# Patient Record
Sex: Female | Born: 1960 | Race: Black or African American | Hispanic: No | Marital: Married | Smoking: Former smoker
Health system: Southern US, Community
[De-identification: ages and names within clinical notes are randomized; demographics above are authoritative.]

## PROBLEM LIST (undated history)

## (undated) DIAGNOSIS — R7303 Prediabetes: Secondary | ICD-10-CM

## (undated) DIAGNOSIS — J45909 Unspecified asthma, uncomplicated: Secondary | ICD-10-CM

## (undated) DIAGNOSIS — I10 Essential (primary) hypertension: Secondary | ICD-10-CM

## (undated) DIAGNOSIS — N951 Menopausal and female climacteric states: Secondary | ICD-10-CM

## (undated) DIAGNOSIS — R1310 Dysphagia, unspecified: Secondary | ICD-10-CM

## (undated) DIAGNOSIS — K219 Gastro-esophageal reflux disease without esophagitis: Secondary | ICD-10-CM

## (undated) DIAGNOSIS — F32A Depression, unspecified: Secondary | ICD-10-CM

## (undated) DIAGNOSIS — F909 Attention-deficit hyperactivity disorder, unspecified type: Secondary | ICD-10-CM

## (undated) HISTORY — PX: NASAL SINUS SURGERY: SHX719

## (undated) HISTORY — DX: Attention-deficit hyperactivity disorder, unspecified type: F90.9

## (undated) HISTORY — DX: Gastro-esophageal reflux disease without esophagitis: K21.9

## (undated) HISTORY — DX: Menopausal and female climacteric states: N95.1

## (undated) HISTORY — PX: SHOULDER SURGERY: SHX246

## (undated) HISTORY — DX: Depression, unspecified: F32.A

---

## 2000-11-11 ENCOUNTER — Encounter: Payer: Self-pay | Admitting: Family Medicine

## 2000-11-11 ENCOUNTER — Ambulatory Visit (HOSPITAL_COMMUNITY): Admission: RE | Admit: 2000-11-11 | Discharge: 2000-11-11 | Payer: Self-pay | Admitting: Family Medicine

## 2000-12-29 ENCOUNTER — Encounter: Payer: Self-pay | Admitting: *Deleted

## 2000-12-29 ENCOUNTER — Ambulatory Visit (HOSPITAL_COMMUNITY): Admission: RE | Admit: 2000-12-29 | Discharge: 2000-12-29 | Payer: Self-pay | Admitting: *Deleted

## 2001-06-26 ENCOUNTER — Encounter: Payer: Self-pay | Admitting: Family Medicine

## 2001-06-26 ENCOUNTER — Ambulatory Visit (HOSPITAL_COMMUNITY): Admission: RE | Admit: 2001-06-26 | Discharge: 2001-06-26 | Payer: Self-pay | Admitting: Family Medicine

## 2001-07-10 ENCOUNTER — Encounter: Payer: Self-pay | Admitting: Family Medicine

## 2001-07-10 ENCOUNTER — Ambulatory Visit (HOSPITAL_COMMUNITY): Admission: RE | Admit: 2001-07-10 | Discharge: 2001-07-10 | Payer: Self-pay | Admitting: Family Medicine

## 2002-03-02 ENCOUNTER — Encounter: Payer: Self-pay | Admitting: *Deleted

## 2002-03-02 ENCOUNTER — Ambulatory Visit (HOSPITAL_COMMUNITY): Admission: RE | Admit: 2002-03-02 | Discharge: 2002-03-02 | Payer: Self-pay | Admitting: *Deleted

## 2002-03-23 ENCOUNTER — Ambulatory Visit (HOSPITAL_COMMUNITY): Admission: RE | Admit: 2002-03-23 | Discharge: 2002-03-23 | Payer: Self-pay | Admitting: *Deleted

## 2002-03-23 ENCOUNTER — Encounter: Payer: Self-pay | Admitting: *Deleted

## 2002-04-13 ENCOUNTER — Ambulatory Visit (HOSPITAL_COMMUNITY): Admission: RE | Admit: 2002-04-13 | Discharge: 2002-04-13 | Payer: Self-pay | Admitting: Internal Medicine

## 2002-07-13 ENCOUNTER — Encounter: Payer: Self-pay | Admitting: Family Medicine

## 2002-07-13 ENCOUNTER — Ambulatory Visit (HOSPITAL_COMMUNITY): Admission: RE | Admit: 2002-07-13 | Discharge: 2002-07-13 | Payer: Self-pay | Admitting: *Deleted

## 2003-08-02 ENCOUNTER — Ambulatory Visit (HOSPITAL_COMMUNITY): Admission: RE | Admit: 2003-08-02 | Discharge: 2003-08-02 | Payer: Self-pay | Admitting: Family Medicine

## 2004-01-01 ENCOUNTER — Ambulatory Visit (HOSPITAL_COMMUNITY): Admission: RE | Admit: 2004-01-01 | Discharge: 2004-01-01 | Payer: Self-pay | Admitting: Occupational Therapy

## 2004-07-01 ENCOUNTER — Ambulatory Visit (HOSPITAL_COMMUNITY): Admission: RE | Admit: 2004-07-01 | Discharge: 2004-07-01 | Payer: Self-pay | Admitting: Occupational Therapy

## 2004-07-02 ENCOUNTER — Inpatient Hospital Stay: Payer: Self-pay

## 2005-04-05 ENCOUNTER — Ambulatory Visit (HOSPITAL_COMMUNITY): Admission: RE | Admit: 2005-04-05 | Discharge: 2005-04-05 | Payer: Self-pay | Admitting: Obstetrics and Gynecology

## 2005-05-04 ENCOUNTER — Ambulatory Visit (HOSPITAL_COMMUNITY): Admission: RE | Admit: 2005-05-04 | Discharge: 2005-05-04 | Payer: Self-pay | Admitting: Internal Medicine

## 2005-05-06 ENCOUNTER — Ambulatory Visit (HOSPITAL_COMMUNITY): Admission: RE | Admit: 2005-05-06 | Discharge: 2005-05-06 | Payer: Self-pay | Admitting: Internal Medicine

## 2005-08-03 ENCOUNTER — Ambulatory Visit (HOSPITAL_COMMUNITY): Admission: RE | Admit: 2005-08-03 | Discharge: 2005-08-03 | Payer: Self-pay | Admitting: Internal Medicine

## 2005-08-19 ENCOUNTER — Encounter: Admission: RE | Admit: 2005-08-19 | Discharge: 2005-08-19 | Payer: Self-pay | Admitting: Internal Medicine

## 2005-11-04 ENCOUNTER — Ambulatory Visit: Payer: Self-pay | Admitting: Internal Medicine

## 2005-11-26 ENCOUNTER — Ambulatory Visit: Payer: Self-pay | Admitting: Internal Medicine

## 2005-11-26 ENCOUNTER — Ambulatory Visit (HOSPITAL_COMMUNITY): Admission: RE | Admit: 2005-11-26 | Discharge: 2005-11-26 | Payer: Self-pay | Admitting: Internal Medicine

## 2006-02-10 ENCOUNTER — Ambulatory Visit (HOSPITAL_COMMUNITY): Admission: RE | Admit: 2006-02-10 | Discharge: 2006-02-10 | Payer: Self-pay | Admitting: Nurse Practitioner

## 2006-04-12 ENCOUNTER — Ambulatory Visit: Payer: Self-pay | Admitting: Internal Medicine

## 2006-04-26 ENCOUNTER — Ambulatory Visit (HOSPITAL_COMMUNITY): Admission: RE | Admit: 2006-04-26 | Discharge: 2006-04-26 | Payer: Self-pay | Admitting: Internal Medicine

## 2006-04-26 ENCOUNTER — Ambulatory Visit: Payer: Self-pay | Admitting: Internal Medicine

## 2006-08-10 ENCOUNTER — Ambulatory Visit (HOSPITAL_COMMUNITY): Admission: RE | Admit: 2006-08-10 | Discharge: 2006-08-10 | Payer: Self-pay | Admitting: Nurse Practitioner

## 2006-09-06 ENCOUNTER — Ambulatory Visit: Payer: Self-pay | Admitting: Internal Medicine

## 2007-04-06 ENCOUNTER — Ambulatory Visit (HOSPITAL_COMMUNITY): Admission: RE | Admit: 2007-04-06 | Discharge: 2007-04-06 | Payer: Self-pay | Admitting: Obstetrics and Gynecology

## 2007-08-14 ENCOUNTER — Ambulatory Visit (HOSPITAL_COMMUNITY): Admission: RE | Admit: 2007-08-14 | Discharge: 2007-08-14 | Payer: Self-pay | Admitting: Internal Medicine

## 2008-03-20 ENCOUNTER — Ambulatory Visit (HOSPITAL_COMMUNITY): Admission: RE | Admit: 2008-03-20 | Discharge: 2008-03-20 | Payer: Self-pay | Admitting: Internal Medicine

## 2008-03-20 ENCOUNTER — Ambulatory Visit: Payer: Self-pay | Admitting: Cardiology

## 2008-03-20 ENCOUNTER — Encounter (INDEPENDENT_AMBULATORY_CARE_PROVIDER_SITE_OTHER): Payer: Self-pay | Admitting: Internal Medicine

## 2008-08-14 ENCOUNTER — Ambulatory Visit (HOSPITAL_COMMUNITY): Admission: RE | Admit: 2008-08-14 | Discharge: 2008-08-14 | Payer: Self-pay | Admitting: Internal Medicine

## 2008-11-01 ENCOUNTER — Ambulatory Visit (HOSPITAL_COMMUNITY): Admission: RE | Admit: 2008-11-01 | Discharge: 2008-11-01 | Payer: Self-pay | Admitting: Internal Medicine

## 2009-02-25 DIAGNOSIS — J3489 Other specified disorders of nose and nasal sinuses: Secondary | ICD-10-CM | POA: Insufficient documentation

## 2009-05-30 ENCOUNTER — Ambulatory Visit: Payer: Self-pay | Admitting: Nurse Practitioner

## 2009-08-21 ENCOUNTER — Ambulatory Visit (HOSPITAL_COMMUNITY): Admission: RE | Admit: 2009-08-21 | Discharge: 2009-08-21 | Payer: Self-pay | Admitting: Internal Medicine

## 2009-08-21 ENCOUNTER — Ambulatory Visit (HOSPITAL_COMMUNITY): Admission: RE | Admit: 2009-08-21 | Discharge: 2009-08-21 | Payer: Self-pay | Admitting: Obstetrics and Gynecology

## 2009-11-17 ENCOUNTER — Ambulatory Visit: Payer: Self-pay | Admitting: Gastroenterology

## 2009-11-20 ENCOUNTER — Ambulatory Visit: Payer: Self-pay | Admitting: Gastroenterology

## 2010-07-12 ENCOUNTER — Encounter: Payer: Self-pay | Admitting: Internal Medicine

## 2010-07-12 ENCOUNTER — Encounter: Payer: Self-pay | Admitting: Obstetrics and Gynecology

## 2010-07-28 ENCOUNTER — Other Ambulatory Visit (HOSPITAL_COMMUNITY): Payer: Self-pay | Admitting: Nurse Practitioner

## 2010-07-28 DIAGNOSIS — Z139 Encounter for screening, unspecified: Secondary | ICD-10-CM

## 2010-08-24 ENCOUNTER — Ambulatory Visit (HOSPITAL_COMMUNITY)
Admission: RE | Admit: 2010-08-24 | Discharge: 2010-08-24 | Disposition: A | Discharge status: Home / Self Care | Payer: BC Managed Care – PPO | Source: Ambulatory Visit | Attending: Nurse Practitioner | Admitting: Nurse Practitioner

## 2010-08-24 DIAGNOSIS — Z139 Encounter for screening, unspecified: Secondary | ICD-10-CM

## 2010-08-24 DIAGNOSIS — Z1231 Encounter for screening mammogram for malignant neoplasm of breast: Secondary | ICD-10-CM | POA: Insufficient documentation

## 2010-11-06 NOTE — Op Note (Signed)
NAME:  Laurie Reyes, Laurie Reyes      ACCOUNT NO.:  [id number]   MEDICAL RECORD NO.:  [id number]          PATIENT TYPE:  AMB   LOCATION:  DAY                           FACILITY:  [facility]   PHYSICIAN:  R. Roetta Sessions, M.D. DATE OF BIRTH:  1960/12/06   DATE OF PROCEDURE:  04/26/2006  DATE OF DISCHARGE:                                 OPERATIVE REPORT   PROCEDURE:  1. Diagnostic colonoscopy.  2. Ileoscopy.   INDICATIONS FOR PROCEDURE:  The patient is a 50 year old lady with a change  in bowel habits with constipation recently.  She has not had any rectal  bleeding.  She does have a history of iron deficiency anemia.  Colonoscopy  now being done.  This approach has been discussed with the patient at  length.  The potential risks, benefits and alternatives have been reviewed.  She has never had her lower GI tract evaluated.  There is no family history  of colorectal neoplasia.   PROCEDURE NOTE:  The O2 saturation, blood pressure, pulse and respirations  were monitored throughout the entire procedure.   CONSCIOUS SEDATION:  1. Versed 4 mg IV.  2. Demerol 100 mg IV in divided doses.   INSTRUMENT:  Olympus video chip system.   FINDINGS:  Digital rectal exam revealed no abnormalities.   ENDOSCOPIC FINDINGS:  Prep was good.   Rectal:  Examination of the rectal mucosa including retroflexed view of the  anal verge revealed __________ anal papilla, otherwise the rectal mucosa  appeared normal.   Colon:  The colonic mucosa was surveyed from the rectosigmoid junction  through the left transverse, right colon to the appendiceal orifice,  ileocecal valve and cecum.  These structures were well seen, photographed  for the record, terminal ileum was then admitted to 10 cm.  From this level  the scope was withdrawn.  All previously reached mucosal surfaces were again  seen.  The terminal ileum mucosa appeared entirely normal.  The preparation  was excellent.  The patient tolerated the procedure  well and was reactive to  endoscopy.   IMPRESSION:  __________ anal papilla, otherwise normal rectum, colon and  terminal ileum.   RECOMMENDATIONS:  Constipation literature provided to Laurie Reyes.  Amitiza 24 mcg one tablet daily with food for 5 days and increased to 24 mcg  b.i.d. with food.  I plan to see this nice lady back in the office in 1  month.  Incidentally, she previously underwent dilation of her esophagus,  had EGD on November 26, 2005.  She tells me her dysphagia is completely resolved.      Jonathon Bellows, M.D.  Electronically Signed     RMR/MEDQ  D:  04/26/2006  T:  04/26/2006  Job:  [id number]   cc:   Tesfaye D. Felecia Shelling, MD  Fax: [phone]

## 2010-11-06 NOTE — H&P (Signed)
NAME:  Laurie, Reyes      ACCOUNT NO.:  [id number]   MEDICAL RECORD NO.:  [id number]          PATIENT TYPE:  OUT   LOCATION:  RAD                           FACILITY:  [facility]   PHYSICIAN:  R. Roetta Sessions, M.D. DATE OF BIRTH:  01/12/1961   DATE OF ADMISSION:  DATE OF DISCHARGE:  LH                                HISTORY & PHYSICAL   CHIEF COMPLAINT:  Gastroesophageal reflux disease, dysphagia and altered  bowel habits.   Laurie Reyes is a 50 year old African-American female with  longstanding gastroesophageal reflux __________ Schatzki's ring who reports  somewhat refractory reflux symptoms on Prilosec. Nexium never worked for  her. She is currently not on any acid suppression therapy. She also has  recurrent esophageal dysphagia to solids, history of a Schatzki's ring,  lastly dilated by me in 2003. It appeared to be noncritical. I passed a 56  [place] Maloney dilator. This lasted for a good year or so and then she  started having recurrent symptoms. Before this, I dilated her in 2000. She  also has alternating constipation and intermittent lower abdominal cramps  and has not passed any blood per rectum. There is no family history of  colorectal neoplasia. She has never had her low GI tract imaged; however,  she specifically has requested a colonoscopy because of her race and age.  She does not use nonsteroidals, she does not use tobacco or alcohol.   PAST MEDICAL HISTORY:  Significant for gastroesophageal reflux disease,  dysphagia.   PAST SURGICAL HISTORY:  Sinus surgery previously. She had a cardiac workup  in [place] earlier this year for some vague chest pain, cardiac workup was  negative.   CURRENT MEDICATIONS:  1.  Calcium once daily.  2.  Magnesium once daily.  3.  Multivitamin daily.  4.  Fish oil daily.  5.  Caltrate daily.  6.  Vitamin E 400 units daily.  7.  It is also notable that Dr. Felecia Shelling, her primary care physician, has also  started her on a pill which she does not know the name of, three tabs      daily which has helped with some of her abdominal symptoms.   ALLERGIES:  PENICILLIN and SULFA.   FAMILY HISTORY:  Mother is alive at age 36 with hypertension and  hypercholesterolemia. Father died at age 48 cause unknown. No history of  chronic GI or liver illness.   SOCIAL HISTORY:  The patient is married and has 2 children. 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 No tobacco, no alcohol.   REVIEW OF SYSTEMS:  No recent chest pain or dyspnea on exertion. No fever,  chills or change in weight.   PHYSICAL EXAMINATION:  GENERAL:  Reveals a pleasant, 50 year old lady  resting comfortably.  VITAL SIGNS:  Weight 187, height 5 foot 5, temp 98.3, BP 124/62, pulse 82.  SKIN:  Warm and dry.  HEENT:  No scleral icterus. JVD is not prominent.  CHEST:  Lungs are clear to auscultation.  CARDIAC:  Regular rate and rhythm without murmur, gallop or rub.  ABDOMEN:  Nondistended, positive  bowel sounds, soft, nontender without  appreciable mass or organomegaly.  EXTREMITIES:  No edema.  RECTAL:  Deferred to time of colonoscopy.   Not mentioned above, Laurie Reyes also mentions intermittent fecal  seepage. She might have a bowel movement later, urinate and detect a small  amount of fecal soilage in her underclothes.   IMPRESSION:  Laurie Reyes is a pleasant, 50 year old lady with  longstanding gastroesophageal reflux disease symptoms now with apparent  esophageal dysphagia. She has a known Schatzki's ring and benefitted from  dilation previously. She has alternating constipation, diarrhea and  intermittent fecal seepage. The symptoms are nonspecific but more or less  follow in the realm of irritable bowel syndrome. She has asked specifically  for a colonoscopy at this time. Although she is somewhat young for the  standard threshhold age for  screening, she is African-American and does have  altered bowel habits and this request is not unreasonable.   RECOMMENDATIONS:  Will go ahead and plan to perform an EGD with dilation as  appropriate as well as a diagnostic colonoscopy in the very near future. The  potential risks, benefits, and alternatives have been reviewed, questions  answered and she is agreeable. She is asked to bring the medication that Dr.  Felecia Shelling prescribed for her so we can determine the identity of that  medication.   I have also given her samples of AcipHex 20 mg tablet 1 daily #14 to try  between now and the time of her endoscopic evaluation to see how this works  for her reflux disease.      Jonathon Bellows, M.D.  Electronically Signed     RMR/MEDQ  D:  11/04/2005  T:  11/04/2005  Job:  [id number]   cc:   Tesfaye D. Felecia Shelling, MD  Fax: [phone]

## 2010-11-06 NOTE — H&P (Signed)
NAME:  KIOR, FLORIO      ACCOUNT NO.:  [id number]   MEDICAL RECORD NO.:  [id number]          PATIENT TYPE:  AMB   LOCATION:  DAY                           FACILITY:  [facility]   PHYSICIAN:  R. Roetta Sessions, M.D. DATE OF BIRTH:  [birth year]   DATE OF ADMISSION:  DATE OF DISCHARGE:  LH                                HISTORY & PHYSICAL   CHIEF COMPLAINT:  Change in bowel movements, constipation, weight loss,  abdominal pain and history of anemia.   HISTORY OF PRESENT ILLNESS:  Ms. Neumaier is a 50 year old African-  American female.  She was last seen by Dr. Jena Gauss when she underwent EGD on  November 26, 2005.  She was found to have normal EGD, a small hiatal hernia, and  was empirically dilated with a 56 [place] Maloney dilator.  She complains of  significant abdominal bloating.  She only had about two bowel movements in  the last two weeks.  She has tried fiber, over-the-counter laxatives and  Milk of Magnesia all of which she says locks her up.  Rarely she will have  intermittent diarrhea.  She tells me she has lost 10 pounds in the last 5  months.  This has been unintentional.  She denies any rectal bleeding or  melena.  She is only eating one meal a day because she has such severe  bloating and gas.  She also complains of a constant mid abdominal aching  pain.  She has been taking Aciphex 20 mg daily for her GERD symptoms which  are well-controlled at this time.  She has a history of anemia. She used to  be on iron and B-12.  She is having chronic nausea as well as dyspepsia.   PAST MEDICAL HISTORY:  1. GERD.  2. Dysphagia.  Last EGD as described in HPI.  3. Sinus surgery.  4. Cardiac work-up which was negative earlier this year.   CURRENT MEDICATIONS:  Denies any.   ALLERGIES:  PENICILLIN AND SULFA.   FAMILY HISTORY:  Mother is alive at age 35, hypertension and hyperlipidemia.  Father deceased at age 26, unknown etiology.  No family history of  colorectal carcinoma,  liver or chronic GI problems.   SOCIAL HISTORY:  She is married with two children.  She works at [employer] in  [place].  Denies any tobacco, alcohol or drug use.   REVIEW OF SYSTEMS:  CONSTITUTIONAL:  See HPI.  Denies any fever or chills.  Is complaining of some fatigue.  CARDIOVASCULAR:  Denies chest pain or  palpitations.  PULMONARY:  No shortness of breath, dyspnea, cough,  hemoptysis.  GI:  See HPI.  GU:  Denies any dysuria, hematuria, increased  urinary frequency.   PHYSICAL EXAMINATION:  VITAL SIGNS:  Weight 178.5 pounds, height 65 inches,  temperature 98.2, blood pressure 116/70, pulse 72.  GENERAL:  She is alert and oriented, pleasant and cooperative in no acute  distress.  HEENT: Sclerae clear, anicteric.  Conjunctivae pink. Oropharynx pink and  moist without lesions.  NECK:  Supple without mass or thyromegaly.  CHEST/HEART:  Regular rate and rhythm.  Normal S1 and S2. There are no  murmurs,clicks,rubs  or gallops.  LUNGS:  Clear to auscultation bilaterally.  ABDOMEN:  Positive bowel sounds x4.  No bruits auscultated.  Abdomen is  soft, nontender, nondistended without palpable mass or hepatosplenomegaly.  No rebound tenderness or guarding.  EXTREMITIES:  Without clubbing or edema bilaterally.   IMPRESSION:  Ms. Hemsley is a 50 year old African-American female who  has had a 10 pound unintentional weight loss in the last 5 months. She has a  history of anemia and has been on iron previously as well as B-12 throughout  her life.  She is currently not on B-12 injections.  She has constant mid  abdominal pain as well as constipation which has been a recent change in her  bowel habits.  It has become more severe.  She has had only 2 bowel  movements in the last two weeks.  She is requesting colonoscopy today and I  feel given her history of iron deficiency anemia, weight loss and abdominal  pain and constipation and recent change in bowel habits she warrants further   evaluation.   PLAN:  1. CBC, sedimentation rate and LFT's.  2. Colonoscopy with Dr. Jena Gauss in the near future.  I discussed the      procedure including the risks and benefits including, but not limited      to bleeding, infection, perforation, drug reaction.  A signed consent      will be obtained.  I have given her samples of Fibersure today as well      as constipation literature.  3. Aciphex 20 mg daily, #30 with 5 refills.      Nicholas Lose, N.P.      Jonathon Bellows, M.D.  Electronically Signed    KC/MEDQ  D:  04/13/2006  T:  04/14/2006  Job:  [id number]   cc:   Tesfaye D. Felecia Shelling, MD  Fax: [phone]

## 2010-11-06 NOTE — Procedures (Signed)
NAME:  Laurie Reyes, Laurie Reyes      ACCOUNT NO.:  [id number]   MEDICAL RECORD NO.:  [id number]          PATIENT TYPE:  OUT   LOCATION:  RAD                           FACILITY:  [facility]   PHYSICIAN:  Edward L. Juanetta Gosling, M.D.DATE OF BIRTH:  02/05/1961   DATE OF PROCEDURE:  DATE OF DISCHARGE:  03/20/2008                            PULMONARY FUNCTION TEST   1. Spirometry shows no ventilatory defect and no airflow obstruction.  2. Lung volumes are normal.  3. DLCO is normal.  4. Arterial blood gases normal.      Edward L. Juanetta Gosling, M.D.  Electronically Signed     ELH/MEDQ  D:  03/23/2008  T:  03/23/2008  Job:  [id number]   cc:   Ninetta Lights D. Felecia Shelling, MD  Fax: [phone]

## 2010-11-06 NOTE — Op Note (Signed)
NAME:  Laurie Reyes, Laurie Reyes                  ACCOUNT NO.:  [id number]   MEDICAL RECORD NO.:  [id number]                   PATIENT TYPE:  AMB   LOCATION:  DAY                                  FACILITY:  [facility]   PHYSICIAN:  R. Roetta Sessions, M.D.              DATE OF BIRTH:  1960/07/02   DATE OF PROCEDURE:  DATE OF DISCHARGE:                                 OPERATIVE REPORT   PROCEDURE:  Esophagogastroduodenoscopy with Maloney dilation.   ENDOSCOPIST:  Gerrit Friends. Rourk, M.D.   INDICATIONS FOR PROCEDURE:  The patient is a 50 year old lady with recurrent  esophageal dysphagia.  She has a history of known Schatzki ring which was  dilated some 3 years ago and did well until recently.  Also intermittent  postprandial abdominal cramps and diarrhea, for which NuLev a.c. and h.s.  has been a great help.  CBC recently demonstrated a mild anemia.  Hemoglobin  and hematocrit 11.7 and 35.8, MCV 81.5.  Sed rate, LFTs and amylase all  normal.  EGD is now being done to further evaluate and intervene known  esophageal dysphagia as appropriate.  This approach has been discussed with  the patient at length previously.  The potential risks, benefits, and  alternatives have been reviewed; and questions answered.  Please see my  dictated H&P for more information.  ASA 1.   DESCRIPTION OF PROCEDURE:  O2 saturation, blood pressure, pulse and  respirations were monitored throughout the entirety of the procedure.   CONSCIOUS SEDATION:  Versed 4 mg IV, Demerol 75 mg IV in divided doses.   INSTRUMENT:  Olympus video chip gastroscope.   FINDINGS:  Examination of the tubular esophagus revealed indistinct Schatzki  ring.  Esophageal mucosa otherwise appeared normal.  The EG junction was  easily traversed.   STOMACH:  The gastric cavity was empty.  It insufflated well with air.  A  thorough examination of the gastric mucosa including a retroflex view of the  proximal stomach and esophagogastric junction  demonstrated only a small  hiatal hernia.  The pylorus was patent and easily traversed.   DUODENUM:  The bulb and the second portion appeared normal.   THERAPY/DIAGNOSTIC MANEUVERS:  A 56 French Maloney dilator was passed to  full insertion with ease.  A look back revealed a slight trauma through the  UVAS otherwise no apparent complications related to passage of the dilator.   The patient tolerated the procedure well and was reacted in endoscopy.   IMPRESSION:  1. Indistinct Schatzki ring otherwise normal esophageal mucosa.  2. Status post Maloney dilation as described above.  3. A small hiatal hernia.  4. The remainder of the gastric mucosa appeared normal.  5. Normal D1, D2.   RECOMMENDATIONS:  1. Continue Prilosec 20 mg orally daily for GERD.  2. Continue NuLev a.c. and h.s. p.r.n.  3. Follow up appointment with me in 6 weeks to see how she is doing.  Jonathon Bellows, M.D.    RMR/MEDQ  D:  04/13/2002  T:  04/13/2002  Job:  [id number]   cc:   Jeri Modena

## 2010-11-06 NOTE — Op Note (Signed)
NAME:  Laurie Reyes, Laurie Reyes      ACCOUNT NO.:  [id number]   MEDICAL RECORD NO.:  [id number]          PATIENT TYPE:  AMB   LOCATION:  DAY                           FACILITY:  [facility]   PHYSICIAN:  R. Roetta Sessions, M.D. DATE OF BIRTH:  [birth year]   DATE OF PROCEDURE:  11/26/2005  DATE OF DISCHARGE:                                 OPERATIVE REPORT   PROCEDURE:  Esophagogastroduodenoscopy with Elease Hashimoto dilation.   INDICATION FOR PROCEDURE:  The patient is a 50 year old lady who has a  history of esophageal dysphagia, for which she underwent passage of a 53  French Malone dilator back in 2003.  She had an excellent resolution in her  symptoms until just recently.  She has had alternating constipation and  diarrhea with intermittent fecal seepage as well.  I started her on some  AcipHex 20 mg orally daily recently.  She tells me that ALL the above-  mentioned symptoms have resolved.  She has now declined to have a  colonoscopy, citing her insurance benefit issues as the reason.  EGD is now  being done.  Potential risks, benefits, and alternatives have been reviewed,  questions answered, she is agreeable.  Please see documentation in the  medical record.   PROCEDURE NOTE:  O2 saturation, blood pressure, pulse, and respiration were  monitored throughout the entire procedure.   CONSCIOUS SEDATION:  Versed 5 mg IV, Demerol 75 mg IV in divided doses.   INSTRUMENT USED:  Olympus video chip system.   FINDINGS:  Examination of the tubular esophagus revealed no mucosal  abnormalities.  EG junction easily traversed.   Stomach:  The gastric cavity was empty and insufflated well with air.  A  thorough examination of the gastric mucosa including a retroflexed view of  the proximal stomach and esophagogastric junction demonstrated only a small  hiatal hernia.  The pylorus was patent and easily traversed.  Examination of  the bulb and second portion revealed no abnormalities.   Therapeutic/diagnostic maneuvers performed:  A 56 French Maloney dilator was  passed to full insertion with good patient tolerance.  A look back revealed  there was a small rent through the UES mucosa, otherwise no apparent  complications related to passage of the dilator.  The patient tolerated the  procedure well and was reacted in endoscopy.   IMPRESSION:  1.  Endoscopically normal-appearing esophagus, status post dilation      described above.  2.  Small hiatal hernia, otherwise normal stomach, D1, D2.   RECOMMENDATIONS:  1.  She has had a remarkably good response to AcipHex.  Will continue on      AcipHex 20 mg orally daily.  2.  She has declined to have a colonoscopy at this time.  I will at least      send her home with three mail-in Hemoccults.  We will plan to see her      back in 3 months and see how she is doing.      Jonathon Bellows, M.D.  Electronically Signed     RMR/MEDQ  D:  11/26/2005  T:  11/26/2005  Job:  [id number]   cc:  Tesfaye D. Felecia Shelling, MD  Fax: [phone]

## 2011-03-22 LAB — BLOOD GAS, ARTERIAL
Acid-base deficit: 0.7
FIO2: 21
pCO2 arterial: 38.7
pH, Arterial: 7.4

## 2011-08-11 ENCOUNTER — Other Ambulatory Visit: Payer: Self-pay | Admitting: Gastroenterology

## 2011-08-11 LAB — CLOSTRIDIUM DIFFICILE BY PCR

## 2011-08-12 LAB — WBCS, STOOL

## 2011-08-14 LAB — STOOL CULTURE

## 2011-08-17 ENCOUNTER — Other Ambulatory Visit (HOSPITAL_COMMUNITY): Payer: Self-pay | Admitting: Nurse Practitioner

## 2011-08-17 DIAGNOSIS — Z139 Encounter for screening, unspecified: Secondary | ICD-10-CM

## 2011-08-27 ENCOUNTER — Ambulatory Visit (HOSPITAL_COMMUNITY): Payer: BC Managed Care – PPO

## 2011-08-27 ENCOUNTER — Ambulatory Visit (HOSPITAL_COMMUNITY)
Admission: RE | Admit: 2011-08-27 | Discharge: 2011-08-27 | Disposition: A | Discharge status: Home / Self Care | Payer: BC Managed Care – PPO | Source: Ambulatory Visit | Attending: Nurse Practitioner | Admitting: Nurse Practitioner

## 2011-08-27 DIAGNOSIS — Z139 Encounter for screening, unspecified: Secondary | ICD-10-CM

## 2011-08-27 DIAGNOSIS — Z1231 Encounter for screening mammogram for malignant neoplasm of breast: Secondary | ICD-10-CM | POA: Insufficient documentation

## 2011-09-28 ENCOUNTER — Ambulatory Visit: Payer: Self-pay | Admitting: Gastroenterology

## 2011-10-01 LAB — PATHOLOGY REPORT

## 2012-09-01 ENCOUNTER — Other Ambulatory Visit (HOSPITAL_COMMUNITY): Payer: Self-pay | Admitting: Internal Medicine

## 2012-09-01 DIAGNOSIS — Z139 Encounter for screening, unspecified: Secondary | ICD-10-CM

## 2012-09-05 ENCOUNTER — Ambulatory Visit (HOSPITAL_COMMUNITY): Payer: BC Managed Care – PPO

## 2012-09-05 ENCOUNTER — Ambulatory Visit (HOSPITAL_COMMUNITY)
Admission: RE | Admit: 2012-09-05 | Discharge: 2012-09-05 | Disposition: A | Discharge status: Home / Self Care | Payer: BC Managed Care – PPO | Source: Ambulatory Visit | Attending: Internal Medicine | Admitting: Internal Medicine

## 2012-09-05 DIAGNOSIS — Z139 Encounter for screening, unspecified: Secondary | ICD-10-CM

## 2012-09-05 DIAGNOSIS — Z1231 Encounter for screening mammogram for malignant neoplasm of breast: Secondary | ICD-10-CM | POA: Insufficient documentation

## 2013-04-06 ENCOUNTER — Encounter (HOSPITAL_COMMUNITY): Payer: Self-pay | Admitting: Psychiatry

## 2013-04-06 ENCOUNTER — Ambulatory Visit (INDEPENDENT_AMBULATORY_CARE_PROVIDER_SITE_OTHER): Payer: BC Managed Care – PPO | Admitting: Psychiatry

## 2013-04-06 VITALS — BP 122/78 | Ht 65.0 in | Wt 203.0 lb

## 2013-04-06 DIAGNOSIS — F988 Other specified behavioral and emotional disorders with onset usually occurring in childhood and adolescence: Secondary | ICD-10-CM

## 2013-04-06 MED ORDER — LISDEXAMFETAMINE DIMESYLATE 30 MG PO CAPS: 30.0000 mg | ORAL_CAPSULE | ORAL | Status: DC

## 2013-04-06 NOTE — Progress Notes (Signed)
Psychiatric Assessment Adult  Patient Identification:  Laurie Reyes Date of Evaluation:  04/06/2013 Chief Complaint: "I can't stay focused or complete tasks." History of Chief Complaint:   Chief Complaint  Patient presents with  . ADD  . 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   The patient is self-referred. I treated her great niece for ADHD and we have met through that connection.  The patient states that she's not significantly depressed or anxious at the present time. She was anxious for a while and her primary Dr. put her on citalopram which has been helpful. She is going through menopause and has difficulty sleeping. She often has hot flashes and night sweats. Her gynecologist put her on progesterone but she has stopped it because she is concerned about side effects. The trazodone helps to some degree.  Recently person provides are noted that she's unable to complete her work shift. She's scattered and can't complete tasks. She'll start one thing and go to another. She's very unfocused at home and work. She denies been critically worried about anything. Her 39 year old son as a criminal history but she feels like she's done everything she can do to help him. Her marriage is going well. Her niece does have ADHD and has difficulties in school but we are trying to manage this with her here. She denies anything like suicidal ideation or psychotic symptoms but simply can't stay on task. Review of Systems  Constitutional: Positive for diaphoresis.   Physical Exam not done  Depressive Symptoms: insomnia, disturbed sleep,  (Hypo) Manic Symptoms:   Elevated Mood:  No Irritable Mood:  No Grandiosity:  No Distractibility:   Yes Labiality of Mood:  No Delusions:  No Hallucinations:  No Impulsivity:  No Sexually Inappropriate Behavior:  No Financial Extravagance:  No Flight of Ideas:  No  Anxiety Symptoms: Excessive Worry:  No Panic Symptoms:  No Agoraphobia:  No Obsessive Compulsive: No  Symptoms: None, Specific Phobias:  No Social Anxiety:  No  Psychotic Symptoms:  Hallucinations: No None Delusions:  No Paranoia:  No   Ideas of Reference:  No  PTSD Symptoms: Ever had a traumatic exposure:  No Had a traumatic exposure in the last month:  No Re-experiencing: No None Hypervigilance:  No Hyperarousal: No None Avoidance: No None  Traumatic Brain Injury: No  Past Psychiatric History: Diagnosis: Mild anxiety   Hospitalizations: None   Outpatient Care: None   Substance Abuse Care: None   Self-Mutilation: None   Suicidal Attempts: None   Violent Behaviors: None    Past Medical History:   Past Medical History  Diagnosis Date  . ADHD (attention deficit hyperactivity disorder)   . Hot flashes, menopausal   . Gastric reflux    History of Loss of Consciousness:  No Seizure History:  No Cardiac History:  No Allergies:   Allergies  Allergen Reactions  . Penicillins   . Shellfish Allergy   . Sulfa Antibiotics    Current Medications:  Current Outpatient Prescriptions  Medication Sig Dispense Refill  . citalopram (CELEXA) 40 MG tablet       . EPINEPHrine (EPI-PEN) 0.3 mg/0.3 mL SOAJ injection       . ketoconazole (NIZORAL) 2 % cream       . pantoprazole (PROTONIX) 40 MG  tablet       . traZODone (DESYREL) 50 MG tablet       . lisdexamfetamine (VYVANSE) 30 MG capsule Take 1 capsule (30 mg total) by mouth every morning.  30 capsule  0   No current facility-administered medications for this visit.    Previous Psychotropic Medications:  Medication Dose   Celexa   40 mg every morning   Trazodone   50 mg each bedtime                   Substance Abuse History in the last 12  months: Substance Age of 1st Use Last Use Amount Specific Type  Nicotine      Alcohol      Cannabis      Opiates      Cocaine      Methamphetamines      LSD      Ecstasy      Benzodiazepines      Caffeine      Inhalants      Others:                          Medical Consequences of Substance Abuse: n/a  Legal Consequences of Substance Abuse:n/a  Family Consequences of Substance Abuse: n/a  Blackouts:  No DT's:  No Withdrawal Symptoms:  No None  Social History: Current Place of Residence: [place] [address] of Birth: [place] Family Members: Husband, daughter niece and nephew Marital Status:  Married Children:   Sons: 1  Daughters:  Relationships:  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 Legal History: None Hobbies/Interests: Unknown  Family History:   Family History  Problem Relation Age of Onset  . Alcohol abuse Father   . ADD / ADHD Other   . ADD / ADHD Other     Mental Status Examination/Evaluation: Objective:  Appearance: Neat and Well Groomed  Eye Contact::  Good  Speech:  Normal Rate  Volume:  Normal  Mood:  Euthymic   Affect:  Full Range  Thought Process:  Negative  Orientation:  Full (Time, Place, and Person)  Thought Content:  Negative  Suicidal Thoughts:  No  Homicidal Thoughts:  No  Judgement:  Good  Insight:  Good  Psychomotor Activity:  Normal  Akathisia:  No  Handed:  Right  AIMS (if indicated):    Assets:  Communication Skills Desire for Improvement Social Support Vocational/Educational    Laboratory/X-Ray Psychological Evaluation(s)        Assessment:  Axis I: ADHD, inattentive type  AXIS I ADHD, inattentive type   AXIS II Deferred  AXIS III Past Medical History  Diagnosis Date  . ADHD (attention deficit hyperactivity disorder)   . Hot flashes, menopausal   . Gastric reflux      AXIS IV other psychosocial or environmental problems  AXIS V 51-60 moderate symptoms   Treatment Plan/Recommendations:  Plan of Care: Medication management   Laboratory:    Psychotherapy:   Medications: I suspect that her focus and attentional problems are secondary to her menopausal symptoms. We will try Vyvanse 30 mg every morning as she has no history of cardiac problems or hypertension. She'll return in four-week's   Routine PRN Medications:  No  Consultations:  Safety Concerns:    Other:      Diannia Ruder, MD 10/17/20143:37 PM

## 2013-04-27 ENCOUNTER — Ambulatory Visit: Payer: Self-pay | Admitting: Gastroenterology

## 2013-04-30 LAB — PATHOLOGY REPORT

## 2013-05-11 ENCOUNTER — Encounter (HOSPITAL_COMMUNITY): Payer: Self-pay | Admitting: Psychiatry

## 2013-05-11 ENCOUNTER — Ambulatory Visit (INDEPENDENT_AMBULATORY_CARE_PROVIDER_SITE_OTHER): Payer: BC Managed Care – PPO | Admitting: Psychiatry

## 2013-05-11 VITALS — BP 130/76 | Ht 65.0 in | Wt 200.0 lb

## 2013-05-11 DIAGNOSIS — F988 Other specified behavioral and emotional disorders with onset usually occurring in childhood and adolescence: Secondary | ICD-10-CM

## 2013-05-11 MED ORDER — LISDEXAMFETAMINE DIMESYLATE 50 MG PO CAPS: 50.0000 mg | ORAL_CAPSULE | Freq: Every day | ORAL | Status: DC

## 2013-05-11 NOTE — Progress Notes (Signed)
Patient ID: Laurie Reyes, female   DOB: 1961/02/15, 52 y.o.   MRN: [id number]  Psychiatric Assessment Adult  Patient Identification:  Laurie Reyes Date of Evaluation:  05/11/2013 Chief Complaint: "I can't stay focused or complete tasks." History of Chief Complaint:   Chief Complaint  Patient presents with  . ADHD  . 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   The patient is self-referred. I treated her great niece for ADHD and we have met through that connection.  The patient states that she's not significantly depressed or anxious at the present time. She was anxious for a while and her primary Dr. put her on citalopram which has been helpful. She is going through menopause and has difficulty sleeping. She often has hot flashes and night sweats. Her gynecologist put her on progesterone but she has stopped it because she is concerned about side effects. The trazodone helps to some degree.  Recently person provides are noted that she's unable to complete her work shift. She's scattered and can't complete tasks. She'll start one thing and go to another. She's very unfocused at home and work. She denies been critically worried about anything. Her 74 year old son as a criminal history but she feels like she's done everything she can do to help him. Her marriage is going well. Her niece does have ADHD and has difficulties in school but we are trying to manage this with her here. She denies anything like suicidal ideation or psychotic symptoms but simply can't stay on task.  The patient returns after four-week's. She's been tried on Vyvanse 30 mg every morning. Helping a little bit but is wearing off by lunchtime. She feels like she needs a higher  dose. She's not had any significant side effects like jitteriness. Her mood has been quite good. Review of Systems  Constitutional: Positive for diaphoresis.   Physical Exam not done  Depressive Symptoms: insomnia, disturbed sleep,  (Hypo) Manic Symptoms:   Elevated Mood:  No Irritable Mood:  No Grandiosity:  No Distractibility:  Yes Labiality of Mood:  No Delusions:  No Hallucinations:  No Impulsivity:  No Sexually Inappropriate Behavior:  No Financial Extravagance:  No Flight of Ideas:  No  Anxiety Symptoms: Excessive Worry:  No Panic Symptoms:  No Agoraphobia:  No Obsessive Compulsive: No  Symptoms: None, Specific Phobias:  No Social Anxiety:  No  Psychotic Symptoms:  Hallucinations: No None Delusions:  No Paranoia:  No   Ideas of Reference:  No  PTSD Symptoms: Ever had a traumatic exposure:  No Had a traumatic exposure in the last month:  No Re-experiencing: No None Hypervigilance:  No Hyperarousal: No None Avoidance: No None  Traumatic Brain Injury: No  Past Psychiatric History: Diagnosis: Mild anxiety   Hospitalizations: None   Outpatient Care: None   Substance Abuse Care: None   Self-Mutilation: None   Suicidal Attempts: None   Violent Behaviors: None    Past Medical History:   Past Medical History  Diagnosis Date  . ADHD (attention deficit hyperactivity disorder)   . Hot flashes, menopausal   . Gastric reflux    History of Loss of Consciousness:  No Seizure History:  No Cardiac History:  No Allergies:   Allergies  Allergen Reactions  . Penicillins   . Shellfish Allergy   .  Sulfa Antibiotics    Current Medications:  Current Outpatient Prescriptions  Medication Sig Dispense Refill  . citalopram (CELEXA) 40 MG tablet       . EPINEPHrine (EPI-PEN) 0.3 mg/0.3 mL SOAJ injection       . ketoconazole (NIZORAL) 2 % cream       . lisdexamfetamine (VYVANSE) 50 MG capsule Take 1 capsule (50 mg total) by mouth daily.  30 capsule  0  .  lisdexamfetamine (VYVANSE) 50 MG capsule Take 1 capsule (50 mg total) by mouth daily.  30 capsule  0  . pantoprazole (PROTONIX) 40 MG tablet       . traZODone (DESYREL) 50 MG tablet        No current facility-administered medications for this visit.    Previous Psychotropic Medications:  Medication Dose   Celexa   40 mg every morning   Trazodone   50 mg each bedtime                   Substance Abuse History in the last 12 months: Substance Age of 1st Use Last Use Amount Specific Type  Nicotine      Alcohol      Cannabis      Opiates      Cocaine      Methamphetamines      LSD      Ecstasy      Benzodiazepines      Caffeine      Inhalants      Others:                          Medical Consequences of Substance Abuse: n/a  Legal Consequences of Substance Abuse:n/a  Family Consequences of Substance Abuse: n/a  Blackouts:  No DT's:  No Withdrawal Symptoms:  No None  Social History: Current Place of Residence: [place] [address] of Birth: [place] Family Members: Husband, daughter niece and nephew Marital Status:  Married Children:   Sons: 1  Daughters:  Relationships:  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 Legal History: None Hobbies/Interests: Unknown  Family History:   Family History  Problem Relation Age of Onset  . Alcohol abuse Father   . ADD / ADHD Other   . ADD / ADHD Other     Mental Status Examination/Evaluation: Objective:  Appearance: Neat and Well Groomed  Eye Contact::  Good  Speech:  Normal Rate  Volume:  Normal  Mood:  Euthymic   Affect:  Full Range  Thought Process:  Negative  Orientation:  Full (Time, Place, and Person)   Thought Content:  Negative  Suicidal Thoughts:  No  Homicidal Thoughts:  No  Judgement:  Good  Insight:  Good  Psychomotor Activity:  Normal  Akathisia:  No  Handed:  Right  AIMS (if indicated):    Assets:  Communication Skills Desire for Improvement Social Support Vocational/Educational    Laboratory/X-Ray Psychological Evaluation(s)        Assessment:  Axis I: ADHD, inattentive type  AXIS I ADHD, inattentive type  AXIS II Deferred  AXIS III Past Medical History  Diagnosis Date  . ADHD (attention deficit hyperactivity disorder)   . Hot flashes, menopausal   . Gastric reflux  AXIS IV other psychosocial or environmental problems  AXIS V 51-60 moderate symptoms   Treatment Plan/Recommendations:  Plan of Care: Medication management   Laboratory:    Psychotherapy:   Medications: I suspect that her focus and attentional problems are secondary to her menopausal symptoms. We will increase Vyvanse to 50 mg every morning as she has no history of cardiac problems or hypertension. She'll return in 2 months   Routine PRN Medications:  No  Consultations:   Safety Concerns:    Other:      Diannia Ruder, MD 11/21/20143:55 PM

## 2013-06-19 ENCOUNTER — Other Ambulatory Visit (HOSPITAL_COMMUNITY): Payer: Self-pay | Admitting: Obstetrics and Gynecology

## 2013-06-19 DIAGNOSIS — N920 Excessive and frequent menstruation with regular cycle: Secondary | ICD-10-CM

## 2013-06-19 DIAGNOSIS — D219 Benign neoplasm of connective and other soft tissue, unspecified: Secondary | ICD-10-CM

## 2013-06-25 ENCOUNTER — Ambulatory Visit (HOSPITAL_COMMUNITY): Payer: BC Managed Care – PPO

## 2013-06-26 ENCOUNTER — Other Ambulatory Visit (HOSPITAL_COMMUNITY): Payer: BC Managed Care – PPO

## 2013-06-26 ENCOUNTER — Ambulatory Visit (HOSPITAL_COMMUNITY): Payer: BC Managed Care – PPO

## 2013-06-29 ENCOUNTER — Encounter (HOSPITAL_COMMUNITY): Payer: Self-pay | Admitting: Psychiatry

## 2013-06-29 ENCOUNTER — Ambulatory Visit (INDEPENDENT_AMBULATORY_CARE_PROVIDER_SITE_OTHER): Payer: BC Managed Care – PPO | Admitting: Psychiatry

## 2013-06-29 ENCOUNTER — Ambulatory Visit (HOSPITAL_COMMUNITY)
Admission: RE | Admit: 2013-06-29 | Discharge: 2013-06-29 | Disposition: A | Discharge status: Home / Self Care | Payer: BC Managed Care – PPO | Source: Ambulatory Visit | Attending: Obstetrics and Gynecology | Admitting: Obstetrics and Gynecology

## 2013-06-29 ENCOUNTER — Ambulatory Visit (HOSPITAL_COMMUNITY): Payer: BC Managed Care – PPO

## 2013-06-29 VITALS — Ht 65.0 in | Wt 201.0 lb

## 2013-06-29 DIAGNOSIS — N83209 Unspecified ovarian cyst, unspecified side: Secondary | ICD-10-CM | POA: Insufficient documentation

## 2013-06-29 DIAGNOSIS — F988 Other specified behavioral and emotional disorders with onset usually occurring in childhood and adolescence: Secondary | ICD-10-CM

## 2013-06-29 DIAGNOSIS — D219 Benign neoplasm of connective and other soft tissue, unspecified: Secondary | ICD-10-CM

## 2013-06-29 DIAGNOSIS — D259 Leiomyoma of uterus, unspecified: Secondary | ICD-10-CM | POA: Insufficient documentation

## 2013-06-29 DIAGNOSIS — N920 Excessive and frequent menstruation with regular cycle: Secondary | ICD-10-CM

## 2013-06-29 DIAGNOSIS — R9389 Abnormal findings on diagnostic imaging of other specified body structures: Secondary | ICD-10-CM | POA: Insufficient documentation

## 2013-06-29 MED ORDER — METHYLPHENIDATE HCL ER (OSM) 36 MG PO TBCR
36.0000 mg | EXTENDED_RELEASE_TABLET | Freq: Every day | ORAL | Status: DC
Start: 2013-06-29 — End: 2013-07-27

## 2013-06-29 MED ORDER — METHYLPHENIDATE HCL ER (OSM) 36 MG PO TBCR: 36.0000 mg | EXTENDED_RELEASE_TABLET | Freq: Every day | ORAL | Status: DC

## 2013-06-29 NOTE — Progress Notes (Signed)
Patient ID: Laurie Reyes, female   DOB: [birth year], 53 y.o.   MRN: [id number] Patient ID: Laurie Reyes, female   DOB: 1960/12/06, 53 y.o.   MRN: [id number]  Psychiatric Assessment Adult  Patient Identification:  Laurie Reyes Date of Evaluation:  06/29/2013 Chief Complaint: "I can't stay focused or complete tasks." History of Chief Complaint:   Chief Complaint  Patient presents with  . ADHD  . 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   The patient is self-referred. I treated her great niece for ADHD and we have met through that connection.  The patient states that she's not significantly depressed or anxious at the present time. She was anxious for a while and her primary Dr. put her on citalopram which has been helpful. She is going through menopause and has difficulty sleeping. She often has hot flashes and night sweats. Her gynecologist put her on progesterone but she has stopped it because she is concerned about side effects. The trazodone helps to some degree.  Recently person provides are noted that she's unable to complete her work shift. She's scattered and can't complete tasks. She'll start one thing and go to another. She's very unfocused at home and work. She denies been critically worried about anything. Her 66 year old son as a criminal history but she feels like she's done everything she can do to help him. Her marriage is going well. Her niece does have ADHD and has difficulties in school but we are trying to manage this with her here. She denies anything like suicidal ideation or psychotic symptoms but simply can't stay on task.  The patient returns after 2 months. Vyvanse is helping her focus but it caused her to break  out in a rash and has shortness of breath so she stopped it about a month ago. Now she is back to having difficulty with focus again. I told her we could try a different medication such as Concerta. Review of Systems  Constitutional: Positive for diaphoresis.   Physical Exam not done  Depressive Symptoms: insomnia, disturbed sleep,  (Hypo) Manic Symptoms:   Elevated Mood:  No Irritable Mood:  No Grandiosity:  No Distractibility:  Yes Labiality of Mood:  No Delusions:  No Hallucinations:  No Impulsivity:  No Sexually Inappropriate Behavior:  No Financial Extravagance:  No Flight of Ideas:  No  Anxiety Symptoms: Excessive Worry:  No Panic Symptoms:  No Agoraphobia:  No Obsessive Compulsive: No  Symptoms: None, Specific Phobias:  No Social Anxiety:  No  Psychotic Symptoms:  Hallucinations: No None Delusions:  No Paranoia:  No   Ideas of Reference:  No  PTSD Symptoms: Ever had a traumatic exposure:  No Had a traumatic exposure in the last month:  No Re-experiencing: No None Hypervigilance:  No Hyperarousal: No None Avoidance: No None  Traumatic Brain Injury: No  Past Psychiatric History: Diagnosis: Mild anxiety   Hospitalizations: None   Outpatient Care: None   Substance Abuse Care: None   Self-Mutilation: None   Suicidal Attempts: None   Violent Behaviors: None    Past Medical History:   Past Medical History  Diagnosis Date  . ADHD (attention deficit hyperactivity disorder)   . Hot flashes, menopausal   . Gastric reflux    History of Loss of Consciousness:  No Seizure History:  No Cardiac History:  No Allergies:   Allergies  Allergen Reactions  . Penicillins   . Shellfish Allergy   . Sulfa Antibiotics    Current Medications:  Current Outpatient Prescriptions  Medication Sig Dispense Refill  . citalopram (CELEXA) 40 MG tablet       . EPINEPHrine (EPI-PEN) 0.3 mg/0.3 mL SOAJ injection       . ketoconazole (NIZORAL) 2 % cream       .  methylphenidate (CONCERTA) 36 MG CR tablet Take 1 tablet (36 mg total) by mouth daily.  30 tablet  0  . methylphenidate (CONCERTA) 36 MG CR tablet Take 1 tablet (36 mg total) by mouth daily.  30 tablet  0  . pantoprazole (PROTONIX) 40 MG tablet       . traZODone (DESYREL) 50 MG tablet        No current facility-administered medications for this visit.    Previous Psychotropic Medications:  Medication Dose   Celexa   40 mg every morning   Trazodone   50 mg each bedtime                   Substance Abuse History in the last 12 months: Substance Age of 1st Use Last Use Amount Specific Type  Nicotine      Alcohol      Cannabis      Opiates      Cocaine      Methamphetamines      LSD      Ecstasy      Benzodiazepines      Caffeine      Inhalants      Others:                          Medical Consequences of Substance Abuse: n/a  Legal Consequences of Substance Abuse:n/a  Family Consequences of Substance Abuse: n/a  Blackouts:  No DT's:  No Withdrawal Symptoms:  No None  Social History: Current Place of Residence: [place] [address] of Birth: [place] Family Members: Husband, daughter niece and nephew Marital Status:  Married Children:   Sons: 1  Daughters:  Relationships:  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 Legal History: None Hobbies/Interests: Unknown  Family History:   Family History  Problem Relation Age of Onset  . Alcohol abuse Father   . ADD / ADHD Other   . ADD / ADHD Other     Mental Status Examination/Evaluation: Objective:  Appearance: Neat and Well Groomed  Eye Contact::  Good  Speech:  Normal Rate  Volume:  Normal  Mood:   Euthymic   Affect:  Full Range  Thought Process:  Negative  Orientation:  Full (Time, Place, and Person)  Thought Content:  Negative  Suicidal Thoughts:  No  Homicidal Thoughts:  No  Judgement:  Good  Insight:  Good  Psychomotor Activity:  Normal  Akathisia:  No  Handed:  Right  AIMS (if indicated):    Assets:  Communication Skills Desire for Improvement Social Support Vocational/Educational    Laboratory/X-Ray Psychological Evaluation(s)        Assessment:  Axis I: ADHD, inattentive type  AXIS I ADHD, inattentive type  AXIS II Deferred  AXIS III Past Medical History  Diagnosis Date  . ADHD (attention deficit hyperactivity disorder)   . Hot flashes, menopausal   . Gastric reflux      AXIS IV other psychosocial or environmental problems  AXIS V 51-60 moderate symptoms   Treatment Plan/Recommendations:  Plan of Care: Medication management   Laboratory:    Psychotherapy:   Medications: I suspect that her focus and attentional problems are secondary to her menopausal symptoms. She will try Concerta 36 mg every morning She'll return in 2 months   Routine PRN Medications:  No  Consultations:   Safety Concerns:    Other:      Diannia Ruder, MD 1/9/20154:22 PM

## 2013-07-27 ENCOUNTER — Encounter (HOSPITAL_COMMUNITY): Payer: Self-pay | Admitting: Psychiatry

## 2013-07-27 ENCOUNTER — Ambulatory Visit (INDEPENDENT_AMBULATORY_CARE_PROVIDER_SITE_OTHER): Payer: BC Managed Care – PPO | Admitting: Psychiatry

## 2013-07-27 VITALS — BP 150/88 | Ht 65.0 in | Wt 200.0 lb

## 2013-07-27 DIAGNOSIS — F988 Other specified behavioral and emotional disorders with onset usually occurring in childhood and adolescence: Secondary | ICD-10-CM

## 2013-07-27 MED ORDER — METHYLPHENIDATE HCL ER (OSM) 54 MG PO TBCR: 54.0000 mg | EXTENDED_RELEASE_TABLET | Freq: Every day | ORAL | Status: DC

## 2013-07-27 NOTE — Progress Notes (Signed)
Patient ID: Laurie Reyes, female   DOB: 04/09/1961, 53 y.o.   MRN: [id number] Patient ID: Laurie Reyes, female   DOB: 08-29-1960, 53 y.o.   MRN: [id number] Patient ID: Laurie Reyes, female   DOB: 07-03-1960, 53 y.o.   MRN: [id number]  Psychiatric Assessment Adult  Patient Identification:  Laurie Reyes Date of Evaluation:  07/27/2013 Chief Complaint: "I can't stay focused or complete tasks." History of Chief Complaint:   Chief Complaint  Patient presents with  . Anxiety  . Depression  . 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   The patient is self-referred. I treated her great niece for ADHD and we have met through that connection.  The patient states that she's not significantly depressed or anxious at the present time. She was anxious for a while and her primary Dr. put her on citalopram which has been helpful. She is going through menopause and has difficulty sleeping. She often has hot flashes and night sweats. Her gynecologist put her on progesterone but she has stopped it because she is concerned about side effects. The trazodone helps to some degree.  Recently  she's unable to complete her work shift. She's scattered and can't complete tasks. She'll start one thing and go to another. She's very unfocused at home and work. She denies been critically worried about anything. Her 48 year old son as a criminal history but she feels like she's done everything she can do to help him. Her marriage is going well. Her niece does have ADHD and has difficulties in school but we are trying to manage this with her here. She denies anything like suicidal ideation or psychotic symptoms but simply can't stay on task.  The  patient returns after 2 months. Concerta is helping her focus to some degree. She went on Celexa for a while and she went back on it the focus of the Concerta seem to be worse. She states that she was like to try getting off the Celexa and I told her it would be best to do it gradually. We could also try an increase in the Concerta dosage from 36-54 mg per day. The patient's blood pressure is a little high today. We will need to watch this closely Review of Systems  Constitutional: Positive for diaphoresis.   Physical Exam not done  Depressive Symptoms: insomnia, disturbed sleep,  (Hypo) Manic Symptoms:   Elevated Mood:  No Irritable Mood:  No Grandiosity:  No Distractibility:  Yes Labiality of Mood:  No Delusions:  No Hallucinations:  No Impulsivity:  No Sexually Inappropriate Behavior:  No Financial Extravagance:  No Flight of Ideas:  No  Anxiety Symptoms: Excessive Worry:  No Panic Symptoms:  No Agoraphobia:  No Obsessive Compulsive: No  Symptoms: None, Specific Phobias:  No Social Anxiety:  No  Psychotic Symptoms:  Hallucinations: No None Delusions:  No Paranoia:  No   Ideas of Reference:  No  PTSD Symptoms: Ever had a traumatic exposure:  No Had a traumatic exposure in the last month:  No Re-experiencing: No None Hypervigilance:  No Hyperarousal: No None Avoidance: No None  Traumatic Brain Injury: No  Past Psychiatric History: Diagnosis: Mild anxiety   Hospitalizations: None   Outpatient Care: None   Substance Abuse Care: None  Self-Mutilation: None   Suicidal Attempts: None   Violent Behaviors: None    Past Medical History:   Past Medical History  Diagnosis Date  . ADHD (attention deficit hyperactivity disorder)   . Hot flashes, menopausal   . Gastric reflux    History of Loss of Consciousness:  No Seizure History:  No Cardiac History:  No Allergies:   Allergies  Allergen Reactions  . Penicillins   . Shellfish Allergy   . Sulfa Antibiotics     Current Medications:  Current Outpatient Prescriptions  Medication Sig Dispense Refill  . citalopram (CELEXA) 40 MG tablet       . EPINEPHrine (EPI-PEN) 0.3 mg/0.3 mL SOAJ injection       . ketoconazole (NIZORAL) 2 % cream       . methylphenidate (CONCERTA) 54 MG CR tablet Take 1 tablet (54 mg total) by mouth daily.  30 tablet  0  . pantoprazole (PROTONIX) 40 MG tablet       . traZODone (DESYREL) 50 MG tablet        No current facility-administered medications for this visit.    Previous Psychotropic Medications:  Medication Dose   Celexa   40 mg every morning   Trazodone   50 mg each bedtime                   Substance Abuse History in the last 12 months: Substance Age of 1st Use Last Use Amount Specific Type  Nicotine      Alcohol      Cannabis      Opiates      Cocaine      Methamphetamines      LSD      Ecstasy      Benzodiazepines      Caffeine      Inhalants      Others:                          Medical Consequences of Substance Abuse: n/a  Legal Consequences of Substance Abuse:n/a  Family Consequences of Substance Abuse: n/a  Blackouts:  No DT's:  No Withdrawal Symptoms:  No None  Social History: Current Place of Residence: [place] [address] of Birth: [place] Family Members: Husband, daughter niece and nephew Marital Status:  Married Children:   Sons: 1  Daughters:  Relationships:  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 Legal History: None Hobbies/Interests: Unknown  Family History:   Family History  Problem Relation Age of Onset  . Alcohol abuse Father   . ADD / ADHD Other   . ADD / ADHD Other     Mental Status  Examination/Evaluation: Objective:  Appearance: Neat and Well Groomed  Eye Contact::  Good  Speech:  Normal Rate  Volume:  Normal  Mood:  Euthymic   Affect:  Full Range  Thought Process:  Negative  Orientation:  Full (Time, Place, and Person)  Thought Content:  Negative  Suicidal Thoughts:  No  Homicidal Thoughts:  No  Judgement:  Good  Insight:  Good  Psychomotor Activity:  Normal  Akathisia:  No  Handed:  Right  AIMS (if indicated):    Assets:  Communication Skills Desire for Improvement Social Support Vocational/Educational  Laboratory/X-Ray Psychological Evaluation(s)        Assessment:  Axis I: ADHD, inattentive type  AXIS I ADHD, inattentive type  AXIS II Deferred  AXIS III Past Medical History  Diagnosis Date  . ADHD (attention deficit hyperactivity disorder)   . Hot flashes, menopausal   . Gastric reflux      AXIS IV other psychosocial or environmental problems  AXIS V 51-60 moderate symptoms   Treatment Plan/Recommendations:  Plan of Care: Medication management   Laboratory:    Psychotherapy:   Medications: I suspect that her focus and attentional problems are secondary to her menopausal symptoms. She will increase Concerta to 54 mg every morning and decrease Celexa to 20 mg every morning She'll return in one   Routine PRN Medications:  No  Consultations:   Safety Concerns:    Other:      Laurie Ruder, MD 2/6/20153:01 PM

## 2013-08-24 ENCOUNTER — Encounter (HOSPITAL_COMMUNITY): Payer: Self-pay | Admitting: Psychiatry

## 2013-08-24 ENCOUNTER — Ambulatory Visit (INDEPENDENT_AMBULATORY_CARE_PROVIDER_SITE_OTHER): Payer: BC Managed Care – PPO | Admitting: Psychiatry

## 2013-08-24 VITALS — BP 120/80 | Ht 65.0 in | Wt 198.0 lb

## 2013-08-24 DIAGNOSIS — F988 Other specified behavioral and emotional disorders with onset usually occurring in childhood and adolescence: Secondary | ICD-10-CM

## 2013-08-24 MED ORDER — AMPHETAMINE-DEXTROAMPHETAMINE 20 MG PO TABS
20.0000 mg | ORAL_TABLET | Freq: Two times a day (BID) | ORAL | Status: DC
Start: 2013-08-24 — End: 2013-10-26

## 2013-08-24 NOTE — Progress Notes (Signed)
Patient ID: Laurie Reyes, female   DOB: [birth year], 53 y.o.   MRN: [id number] Patient ID: Laurie Reyes, female   DOB: July 20, 1960, 53 y.o.   MRN: [id number] Patient ID: Laurie Reyes, female   DOB: 1960/09/06, 53 y.o.   MRN: [id number] Patient ID: Laurie Reyes, female   DOB: Nov 30, 1960, 53 y.o.   MRN: [id number]  Psychiatric Assessment Adult  Patient Identification:  Laurie Reyes Date of Evaluation:  08/24/2013 Chief Complaint: "I can't stay focused or complete tasks." History of Chief Complaint:   Chief Complaint  Patient presents with  . ADD  . Depression  . 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   The patient is self-referred. I treated her great niece for ADHD and we have met through that connection.  The patient states that she's not significantly depressed or anxious at the present time. She was anxious for a while and her primary Dr. put her on citalopram which has been helpful. She is going through menopause and has difficulty sleeping. She often has hot flashes and night sweats. Her gynecologist put her on progesterone but she has stopped it because she is concerned about side effects. The trazodone helps to some degree.  Recently  she's unable to complete her work shift. She's scattered and can't complete tasks. She'll start one thing and go to another. She's very unfocused at home and work. She denies been critically worried about anything. Her 6 year old son as a criminal history but she feels like she's done everything she can do to help him. Her marriage is going well. Her niece does have ADHD and has difficulties in school but we are trying to manage this with her here. She denies anything  like suicidal ideation or psychotic symptoms but simply can't stay on task.  The patient returns after 2 months. The Concerta is not working that well at 54 mg. She is getting more unfocused around noon. Vyvanse caused a rash. I told her we could try 2 doses of a shorter acting medicine like Adderall. She also cut down her Celexa and is now more moody and irritable . I suggested she go back to the 40 mg dose Review of Systems  Constitutional: Positive for diaphoresis.   Physical Exam not done  Depressive Symptoms: insomnia, disturbed sleep,  (Hypo) Manic Symptoms:   Elevated Mood:  No Irritable Mood:  No Grandiosity:  No Distractibility:  Yes Labiality of Mood:  No Delusions:  No Hallucinations:  No Impulsivity:  No Sexually Inappropriate Behavior:  No Financial Extravagance:  No Flight of Ideas:  No  Anxiety Symptoms: Excessive Worry:  No Panic Symptoms:  No Agoraphobia:  No Obsessive Compulsive: No  Symptoms: None, Specific Phobias:  No Social Anxiety:  No  Psychotic Symptoms:  Hallucinations: No None Delusions:  No Paranoia:  No   Ideas of Reference:  No  PTSD Symptoms: Ever had a traumatic exposure:  No Had a traumatic exposure in the last month:  No Re-experiencing: No None Hypervigilance:  No Hyperarousal: No None Avoidance: No None  Traumatic Brain Injury: No  Past Psychiatric History: Diagnosis: Mild anxiety   Hospitalizations: None   Outpatient Care: None   Substance Abuse Care: None   Self-Mutilation: None   Suicidal Attempts: None   Violent  Behaviors: None    Past Medical History:   Past Medical History  Diagnosis Date  . ADHD (attention deficit hyperactivity disorder)   . Hot flashes, menopausal   . Gastric reflux    History of Loss of Consciousness:  No Seizure History:  No Cardiac History:  No Allergies:   Allergies  Allergen Reactions  . Penicillins   . Shellfish Allergy   . Sulfa Antibiotics    Current Medications:  Current  Outpatient Prescriptions  Medication Sig Dispense Refill  . amphetamine-dextroamphetamine (ADDERALL) 20 MG tablet Take 1 tablet (20 mg total) by mouth 2 (two) times daily.  60 tablet  0  . amphetamine-dextroamphetamine (ADDERALL) 20 MG tablet Take 1 tablet (20 mg total) by mouth 2 (two) times daily.  60 tablet  0  . citalopram (CELEXA) 40 MG tablet       . EPINEPHrine (EPI-PEN) 0.3 mg/0.3 mL SOAJ injection       . ketoconazole (NIZORAL) 2 % cream       . pantoprazole (PROTONIX) 40 MG tablet       . traZODone (DESYREL) 50 MG tablet        No current facility-administered medications for this visit.    Previous Psychotropic Medications:  Medication Dose   Celexa   40 mg every morning   Trazodone   50 mg each bedtime                   Substance Abuse History in the last 12 months: Substance Age of 1st Use Last Use Amount Specific Type  Nicotine      Alcohol      Cannabis      Opiates      Cocaine      Methamphetamines      LSD      Ecstasy      Benzodiazepines      Caffeine      Inhalants      Others:                          Medical Consequences of Substance Abuse: n/a  Legal Consequences of Substance Abuse:n/a  Family Consequences of Substance Abuse: n/a  Blackouts:  No DT's:  No Withdrawal Symptoms:  No None  Social History: Current Place of Residence: [place] [address] of Birth: [place] Family Members: Husband, daughter niece and nephew Marital Status:  Married Children:   Sons: 1  Daughters:  Relationships:  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 Legal History: None Hobbies/Interests: Unknown  Family History:   Family History  Problem  Relation Age of Onset  . Alcohol abuse Father   . ADD / ADHD Other   . ADD / ADHD Other     Mental Status Examination/Evaluation: Objective:  Appearance: Neat and Well Groomed  Eye Contact::  Good  Speech:  Normal Rate  Volume:  Normal  Mood:  Euthymic   Affect:  Full Range  Thought Process:  Negative  Orientation:  Full (Time, Place, and Person)  Thought Content:  Negative  Suicidal Thoughts:  No  Homicidal Thoughts:  No  Judgement:  Good  Insight:  Good  Psychomotor Activity:  Normal  Akathisia:  No  Handed:  Right  AIMS (if indicated):    Assets:  Communication Skills Desire for Improvement Social Support Vocational/Educational    Laboratory/X-Ray Psychological Evaluation(s)        Assessment:  Axis I: ADHD, inattentive type  AXIS I ADHD, inattentive type  AXIS II Deferred  AXIS III Past Medical History  Diagnosis Date  . ADHD (attention deficit hyperactivity disorder)   . Hot flashes, menopausal   . Gastric reflux      AXIS IV other psychosocial or environmental problems  AXIS V 51-60 moderate symptoms   Treatment Plan/Recommendations:  Plan of Care: Medication management   Laboratory:    Psychotherapy:   Medications: I suspect that her focus and attentional problems are secondary to her menopausal symptoms. She will discontinue Concerta and start Adderall 20 mg every morning and noon. She'll increase Celexa to 40 mg every morning   Routine PRN Medications:  No  Consultations:   Safety Concerns:    Other:  She'll return in 2 months     Diannia Ruder, MD 3/6/20151:26 PM

## 2013-09-10 ENCOUNTER — Other Ambulatory Visit (HOSPITAL_COMMUNITY): Payer: Self-pay | Admitting: Internal Medicine

## 2013-09-10 DIAGNOSIS — Z1231 Encounter for screening mammogram for malignant neoplasm of breast: Secondary | ICD-10-CM

## 2013-09-11 ENCOUNTER — Ambulatory Visit (HOSPITAL_COMMUNITY)
Admission: RE | Admit: 2013-09-11 | Discharge: 2013-09-11 | Disposition: A | Discharge status: Home / Self Care | Payer: BC Managed Care – PPO | Source: Ambulatory Visit | Attending: Internal Medicine | Admitting: Internal Medicine

## 2013-09-11 DIAGNOSIS — Z1231 Encounter for screening mammogram for malignant neoplasm of breast: Secondary | ICD-10-CM | POA: Insufficient documentation

## 2013-10-26 ENCOUNTER — Encounter (HOSPITAL_COMMUNITY): Payer: Self-pay | Admitting: Psychiatry

## 2013-10-26 ENCOUNTER — Ambulatory Visit (INDEPENDENT_AMBULATORY_CARE_PROVIDER_SITE_OTHER): Payer: BC Managed Care – PPO | Admitting: Psychiatry

## 2013-10-26 VITALS — BP 130/70 | Ht 65.0 in | Wt 204.0 lb

## 2013-10-26 DIAGNOSIS — F988 Other specified behavioral and emotional disorders with onset usually occurring in childhood and adolescence: Secondary | ICD-10-CM

## 2013-10-26 MED ORDER — AMPHETAMINE-DEXTROAMPHETAMINE 20 MG PO TABS: 20.0000 mg | ORAL_TABLET | Freq: Two times a day (BID) | ORAL | Status: DC

## 2013-10-26 NOTE — Progress Notes (Signed)
Patient ID: Shalayna Crail, female   DOB: March 20, 1961, 53 y.o.   MRN: [id number] Patient ID: Jelaine Youngblood, female   DOB: 08-Jul-1960, 53 y.o.   MRN: [id number] Patient ID: Vallory Pilgrim, female   DOB: Oct 14, 1960, 53 y.o.   MRN: [id number] Patient ID: Lindamarie Sneddon, female   DOB: June 06, 1961, 53 y.o.   MRN: [id number] Patient ID: Ursa Marchesi, female   DOB: 1960/08/13, 53 y.o.   MRN: [id number]  Psychiatric Assessment Adult  Patient Identification:  Buford Cater Date of Evaluation:  10/26/2013 Chief Complaint: "I can't stay focused or complete tasks." History of Chief Complaint:   Chief Complaint  Patient presents with  . Depression  . ADHD  . 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   The patient is self-referred. I treated her great niece for ADHD and we have met through that connection.  The patient states that she's not significantly depressed or anxious at the present time. She was anxious for a while and her primary Dr. put her on citalopram which has been helpful. She is going through menopause and has difficulty sleeping. She often has hot flashes and night sweats. Her gynecologist put her on progesterone but she has stopped it because she is concerned about side effects. The trazodone helps to some degree.  Recently  she's unable to complete her work shift. She's scattered and can't complete tasks. She'll start one thing and go to another. She's very unfocused at home and work. She denies been critically worried about anything. Her 32 year old son as a criminal history but she feels like she's done everything she can do to help him. Her marriage is going well. Her niece does have ADHD and has  difficulties in school but we are trying to manage this with her here. She denies anything like suicidal ideation or psychotic symptoms but simply can't stay on task.  The patient returns after 2 months. Adderall seems to be working better for her. She is focused more at work and getting her tests completed. She went back on the citalopram last visit that is prescribed by her primary doctor and the trazodone is helping her sleep. Review of Systems  Constitutional: Positive for diaphoresis.   Physical Exam not done  Depressive Symptoms: insomnia, disturbed sleep,  (Hypo) Manic Symptoms:   Elevated Mood:  No Irritable Mood:  No Grandiosity:  No Distractibility:  Yes Labiality of Mood:  No Delusions:  No Hallucinations:  No Impulsivity:  No Sexually Inappropriate Behavior:  No Financial Extravagance:  No Flight of Ideas:  No  Anxiety Symptoms: Excessive Worry:  No Panic Symptoms:  No Agoraphobia:  No Obsessive Compulsive: No  Symptoms: None, Specific Phobias:  No Social Anxiety:  No  Psychotic Symptoms:  Hallucinations: No None Delusions:  No Paranoia:  No   Ideas of Reference:  No  PTSD Symptoms: Ever had a traumatic exposure:  No Had a traumatic exposure in the last month:  No Re-experiencing: No None Hypervigilance:  No Hyperarousal: No None Avoidance: No None  Traumatic Brain Injury: No  Past Psychiatric History: Diagnosis: Mild anxiety   Hospitalizations: None   Outpatient Care: None   Substance Abuse Care: None   Self-Mutilation: None   Suicidal Attempts: None   Violent Behaviors: None  Past Medical History:   Past Medical History  Diagnosis Date  . ADHD (attention deficit hyperactivity disorder)   . Hot flashes, menopausal   . Gastric reflux    History of Loss of Consciousness:  No Seizure History:  No Cardiac History:  No Allergies:   Allergies  Allergen Reactions  . Penicillins   . Shellfish Allergy   . Sulfa Antibiotics    Current  Medications:  Current Outpatient Prescriptions  Medication Sig Dispense Refill  . amphetamine-dextroamphetamine (ADDERALL) 20 MG tablet Take 1 tablet (20 mg total) by mouth 2 (two) times daily.  60 tablet  0  . amphetamine-dextroamphetamine (ADDERALL) 20 MG tablet Take 1 tablet (20 mg total) by mouth 2 (two) times daily.  60 tablet  0  . citalopram (CELEXA) 40 MG tablet       . EPINEPHrine (EPI-PEN) 0.3 mg/0.3 mL SOAJ injection       . ketoconazole (NIZORAL) 2 % cream       . pantoprazole (PROTONIX) 40 MG tablet       . traZODone (DESYREL) 50 MG tablet        No current facility-administered medications for this visit.    Previous Psychotropic Medications:  Medication Dose   Celexa   40 mg every morning   Trazodone   50 mg each bedtime                   Substance Abuse History in the last 12 months: Substance Age of 1st Use Last Use Amount Specific Type  Nicotine      Alcohol      Cannabis      Opiates      Cocaine      Methamphetamines      LSD      Ecstasy      Benzodiazepines      Caffeine      Inhalants      Others:                          Medical Consequences of Substance Abuse: n/a  Legal Consequences of Substance Abuse:n/a  Family Consequences of Substance Abuse: n/a  Blackouts:  No DT's:  No Withdrawal Symptoms:  No None  Social History: Current Place of Residence: [place] [address] of Birth: [place] Family Members: Husband, daughter niece and nephew Marital Status:  Married Children:   Sons: 1  Daughters:  Relationships:  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 Legal History: None Hobbies/Interests: Unknown  Family History:   Family  History  Problem Relation Age of Onset  . Alcohol abuse Father   . ADD / ADHD Other   . ADD / ADHD Other     Mental Status Examination/Evaluation: Objective:  Appearance: Neat and Well Groomed  Eye Contact::  Good  Speech:  Normal Rate  Volume:  Normal  Mood:  Euthymic   Affect:  Full Range  Thought Process:  Negative  Orientation:  Full (Time, Place, and Person)  Thought Content:  Negative  Suicidal Thoughts:  No  Homicidal Thoughts:  No  Judgement:  Good  Insight:  Good  Psychomotor Activity:  Normal  Akathisia:  No  Handed:  Right  AIMS (if indicated):  Assets:  Communication Skills Desire for Improvement Social Support Vocational/Educational    Laboratory/X-Ray Psychological Evaluation(s)        Assessment:  Axis I: ADHD, inattentive type  AXIS I ADHD, inattentive type  AXIS II Deferred  AXIS III Past Medical History  Diagnosis Date  . ADHD (attention deficit hyperactivity disorder)   . Hot flashes, menopausal   . Gastric reflux      AXIS IV other psychosocial or environmental problems  AXIS V 51-60 moderate symptoms   Treatment Plan/Recommendations:  Plan of Care: Medication management   Laboratory:    Psychotherapy:   Medications: I suspect that her focus and attentional problems are secondary to her menopausal symptoms. She will continue Adderall 20 mg every morning and noon. She'll increase Celexa to 40 mg every morning   Routine PRN Medications:  No  Consultations:   Safety Concerns:    Other:  She'll return in 2 months     Diannia Ruder, MD 5/8/20153:11 PM

## 2013-11-20 ENCOUNTER — Emergency Department: Payer: Self-pay | Admitting: Emergency Medicine

## 2013-11-20 LAB — PRO B NATRIURETIC PEPTIDE: B-TYPE NATIURETIC PEPTID: 31 pg/mL (ref 0–125)

## 2013-11-20 LAB — BASIC METABOLIC PANEL
Anion Gap: 6 — ABNORMAL LOW (ref 7–16)
BUN: 10 mg/dL (ref 7–18)
CALCIUM: 8.9 mg/dL (ref 8.5–10.1)
CO2: 27 mmol/L (ref 21–32)
CREATININE: 0.81 mg/dL (ref 0.60–1.30)
Chloride: 104 mmol/L (ref 98–107)
EGFR (African American): >60
EGFR (Non-African Amer.): >60
Glucose: 88 mg/dL (ref 65–99)
Osmolality: 272 (ref 275–301)
POTASSIUM: 3.8 mmol/L (ref 3.5–5.1)
SODIUM: 137 mmol/L (ref 136–145)

## 2013-11-20 LAB — CBC
HCT: 38 % (ref 35.0–47.0)
HGB: 12 g/dL (ref 12.0–16.0)
MCH: 25.8 pg — AB (ref 26.0–34.0)
MCHC: 31.5 g/dL — ABNORMAL LOW (ref 32.0–36.0)
MCV: 82 fL (ref 80–100)
Platelet: 227 10*3/uL (ref 150–440)
RBC: 4.64 10*6/uL (ref 3.80–5.20)
RDW: 14.9 % — ABNORMAL HIGH (ref 11.5–14.5)
WBC: 5.6 10*3/uL (ref 3.6–11.0)

## 2013-11-20 LAB — TROPONIN I: Troponin-I: <0.02 ng/mL

## 2013-11-29 ENCOUNTER — Other Ambulatory Visit: Payer: Self-pay | Admitting: Gastroenterology

## 2013-11-29 LAB — HCG, QUANTITATIVE, PREGNANCY

## 2013-12-06 ENCOUNTER — Ambulatory Visit: Payer: Self-pay | Admitting: Gastroenterology

## 2013-12-28 ENCOUNTER — Encounter (HOSPITAL_COMMUNITY): Payer: Self-pay | Admitting: Psychiatry

## 2013-12-28 ENCOUNTER — Ambulatory Visit (INDEPENDENT_AMBULATORY_CARE_PROVIDER_SITE_OTHER): Payer: BC Managed Care – PPO | Admitting: Psychiatry

## 2013-12-28 VITALS — BP 150/88 | Ht 65.0 in | Wt 203.0 lb

## 2013-12-28 DIAGNOSIS — F988 Other specified behavioral and emotional disorders with onset usually occurring in childhood and adolescence: Secondary | ICD-10-CM

## 2013-12-28 MED ORDER — AMPHETAMINE-DEXTROAMPHETAMINE 20 MG PO TABS: 20.0000 mg | ORAL_TABLET | Freq: Two times a day (BID) | ORAL | Status: DC

## 2013-12-28 NOTE — Progress Notes (Signed)
Patient ID: Timya Quartararo, female   DOB: July 31, 1960, 53 y.o.   MRN: [id number] Patient ID: Witney Roti, female   DOB: 1961-04-14, 53 y.o.   MRN: [id number] Patient ID: Vonnie Mcwhinney, female   DOB: Dec 23, 1960, 53 y.o.   MRN: [id number] Patient ID: Persephany Batzer, female   DOB: 22-Nov-1960, 53 y.o.   MRN: [id number] Patient ID: Mesha Fusselman, female   DOB: Dec 24, 1960, 53 y.o.   MRN: [id number] Patient ID: Huntleigh Pekrul, female   DOB: Apr 07, 1961, 53 y.o.   MRN: [id number]  Psychiatric Assessment Adult  Patient Identification:  Alianah Schellin Date of Evaluation:  12/28/2013 Chief Complaint: "I can't stay focused or complete tasks." History of Chief Complaint:   Chief Complaint  Patient presents with  . Anxiety  . Depression  . 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   The patient is self-referred. I treated her great niece for ADHD and we have met through that connection.  The patient states that she's not significantly depressed or anxious at the present time. She was anxious for a while and her primary Dr. put her on citalopram which has been helpful. She is going through menopause and has difficulty sleeping. She often has hot flashes and night sweats. Her gynecologist put her on progesterone but she has stopped it because she is concerned about side effects. The trazodone helps to some degree.  Recently  she's unable to complete her work shift. She's scattered and can't complete tasks. She'll start one thing and go to another. She's very unfocused at home and work. She denies been critically worried about anything. Her 6 year old son as a criminal history but she feels like she's done everything  she can do to help him. Her marriage is going well. Her niece does have ADHD and has difficulties in school but we are trying to manage this with her here. She denies anything like suicidal ideation or psychotic symptoms but simply can't stay on task.  The patient returns after 2 months. She is stressed because her niece is trying to regain custody of her great niece who lives with her and take her to [place]. We discussed today how this would not be in the best interest of the child. Nevertheless she feels torn about it. Her mood is holding up fairly well on the Celexa and she is sleeping with the trazodone. The Adderall is helping her focus at work. She doesn't want to change her medicine but her blood pressure was a little bit elevated today and I told her to keep a close eye on it. Review of Systems  Constitutional: Positive for diaphoresis.   Physical Exam not done  Depressive Symptoms: insomnia, disturbed sleep,  (Hypo) Manic Symptoms:   Elevated Mood:  No Irritable Mood:  No Grandiosity:  No Distractibility:  Yes Labiality of Mood:  No Delusions:  No Hallucinations:  No Impulsivity:  No Sexually Inappropriate Behavior:  No Financial Extravagance:  No Flight of Ideas:  No  Anxiety Symptoms: Excessive Worry:  No Panic Symptoms:  No Agoraphobia:  No Obsessive Compulsive: No  Symptoms: None, Specific Phobias:  No Social Anxiety:  No  Psychotic Symptoms:  Hallucinations: No None Delusions:  No Paranoia:  No   Ideas of Reference:  No  PTSD Symptoms:  Ever had a traumatic exposure:  No Had a traumatic exposure in the last month:  No Re-experiencing: No None Hypervigilance:  No Hyperarousal: No None Avoidance: No None  Traumatic Brain Injury: No  Past Psychiatric History: Diagnosis: Mild anxiety   Hospitalizations: None   Outpatient Care: None   Substance Abuse Care: None   Self-Mutilation: None   Suicidal Attempts: None   Violent Behaviors: None    Past  Medical History:   Past Medical History  Diagnosis Date  . ADHD (attention deficit hyperactivity disorder)   . Hot flashes, menopausal   . Gastric reflux    History of Loss of Consciousness:  No Seizure History:  No Cardiac History:  No Allergies:   Allergies  Allergen Reactions  . Penicillins   . Shellfish Allergy   . Sulfa Antibiotics    Current Medications:  Current Outpatient Prescriptions  Medication Sig Dispense Refill  . amphetamine-dextroamphetamine (ADDERALL) 20 MG tablet Take 1 tablet (20 mg total) by mouth 2 (two) times daily.  60 tablet  0  . amphetamine-dextroamphetamine (ADDERALL) 20 MG tablet Take 1 tablet (20 mg total) by mouth 2 (two) times daily.  60 tablet  0  . citalopram (CELEXA) 40 MG tablet       . EPINEPHrine (EPI-PEN) 0.3 mg/0.3 mL SOAJ injection       . ketoconazole (NIZORAL) 2 % cream       . pantoprazole (PROTONIX) 40 MG tablet       . traZODone (DESYREL) 50 MG tablet        No current facility-administered medications for this visit.    Previous Psychotropic Medications:  Medication Dose   Celexa   40 mg every morning   Trazodone   50 mg each bedtime                   Substance Abuse History in the last 12 months: Substance Age of 1st Use Last Use Amount Specific Type  Nicotine      Alcohol      Cannabis      Opiates      Cocaine      Methamphetamines      LSD      Ecstasy      Benzodiazepines      Caffeine      Inhalants      Others:                          Medical Consequences of Substance Abuse: n/a  Legal Consequences of Substance Abuse:n/a  Family Consequences of Substance Abuse: n/a  Blackouts:  No DT's:  No Withdrawal Symptoms:  No None  Social History: Current Place of Residence: [place] [address] of Birth: [place] Family Members: Husband, daughter niece and nephew Marital Status:  Married Children:   Sons: 1  Daughters:  Relationships:  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 Legal History: None Hobbies/Interests: Unknown  Family History:   Family History  Problem Relation Age of Onset  . Alcohol abuse Father   . ADD / ADHD Other   . ADD / ADHD Other     Mental Status Examination/Evaluation: Objective:  Appearance: Neat and Well Groomed  Eye Contact::  Good  Speech:  Normal Rate  Volume:  Normal  Mood:  Euthymic   Affect:  Full Range  Thought Process:  Negative  Orientation:  Full (Time, Place, and Person)  Thought Content:  Negative  Suicidal Thoughts:  No  Homicidal Thoughts:  No  Judgement:  Good  Insight:  Good  Psychomotor Activity:  Normal  Akathisia:  No  Handed:  Right  AIMS (if indicated):    Assets:  Communication Skills Desire for Improvement Social Support Vocational/Educational    Laboratory/X-Ray Psychological Evaluation(s)        Assessment:  Axis I: ADHD, inattentive type  AXIS I ADHD, inattentive type  AXIS II Deferred  AXIS III Past Medical History  Diagnosis Date  . ADHD (attention deficit hyperactivity disorder)   . Hot flashes, menopausal   . Gastric reflux      AXIS IV other psychosocial or environmental problems  AXIS V 51-60 moderate symptoms   Treatment Plan/Recommendations:  Plan of Care: Medication management   Laboratory:    Psychotherapy:   Medications: I suspect that her focus and attentional problems are secondary to her menopausal symptoms. She will continue Adderall 20 mg every morning and noon. She'll increase Celexa to 40 mg every morning   Routine PRN Medications:  No  Consultations:   Safety Concerns:    Other:  She'll return in 2 months     Diannia Ruder, MD 7/10/20152:27 PM

## 2014-02-28 ENCOUNTER — Ambulatory Visit (INDEPENDENT_AMBULATORY_CARE_PROVIDER_SITE_OTHER): Payer: BC Managed Care – PPO | Admitting: Psychiatry

## 2014-02-28 ENCOUNTER — Encounter (HOSPITAL_COMMUNITY): Payer: Self-pay | Admitting: Psychiatry

## 2014-02-28 VITALS — BP 148/89 | HR 76 | Ht 65.0 in | Wt 201.0 lb

## 2014-02-28 DIAGNOSIS — F988 Other specified behavioral and emotional disorders with onset usually occurring in childhood and adolescence: Secondary | ICD-10-CM

## 2014-02-28 MED ORDER — TRAZODONE HCL 50 MG PO TABS: 50.0000 mg | ORAL_TABLET | Freq: Every evening | ORAL | Status: DC | PRN

## 2014-02-28 MED ORDER — AMPHETAMINE-DEXTROAMPHETAMINE 20 MG PO TABS: 20.0000 mg | ORAL_TABLET | Freq: Two times a day (BID) | ORAL | Status: DC

## 2014-02-28 MED ORDER — CITALOPRAM HYDROBROMIDE 40 MG PO TABS: 40.0000 mg | ORAL_TABLET | Freq: Every day | ORAL | Status: DC

## 2014-02-28 NOTE — Progress Notes (Signed)
Patient ID: Laurie Reyes, female   DOB: 1960-10-12, 53 y.o.   MRN: [id number] Patient ID: Laurie Reyes, female   DOB: 10-16-1960, 53 y.o.   MRN: [id number] Patient ID: Laurie Reyes, female   DOB: Sep 21, 1960, 53 y.o.   MRN: [id number] Patient ID: Laurie Reyes, female   DOB: June 17, 1961, 53 y.o.   MRN: [id number] Patient ID: Laurie Reyes, female   DOB: [birth year], 53 y.o.   MRN: [id number] Patient ID: Laurie Reyes, female   DOB: Mar 23, 1961, 53 y.o.   MRN: [id number] Patient ID: Laurie Reyes, female   DOB: 09/07/1960, 53 y.o.   MRN: [id number]  Psychiatric Assessment Adult  Patient Identification:  Laurie Reyes Date of Evaluation:  02/28/2014 Chief Complaint: "I can't stay focused or complete tasks." History of Chief Complaint:   Chief Complaint  Patient presents with  . Anxiety  . Depression  . ADHD  . 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   The patient is self-referred. I treated her great niece for ADHD and we have met through that connection.  The patient states that she's not significantly depressed or anxious at the present time. She was anxious for a while and her primary Dr. put her on citalopram which has been helpful. She is going through menopause and has difficulty sleeping. She often has hot flashes and night sweats. Her gynecologist put her on progesterone but she has stopped it because she is concerned about side effects. The trazodone helps to some degree.  Recently  she's unable to complete her work shift. She's scattered and can't complete tasks. She'll start one thing and go to another. She's very unfocused at home and work. She denies been critically worried about  anything. Her 65 year old son as a criminal history but she feels like she's done everything she can do to help him. Her marriage is going well. Her niece does have ADHD and has difficulties in school but we are trying to manage this with her here. She denies anything like suicidal ideation or psychotic symptoms but simply can't stay on task.  The patient returns after 2 months. She is very stressed because her great niece move to [place] to stay with her mother. The child is only 22 years old and is really struggling because the mother is not providing stability for her. The patient is very worried about her and it's affecting everything in her life. She also admits that she stopped Celexa and trazodone because she wanted to see if she can handle things on her own. I told her this was not the time to try this because she is under so much stress already. She agrees to go back on his medicines and to restart counseling. Adderall is still helping her focus. Review of Systems  Constitutional: Positive for diaphoresis.   Physical Exam not done  Depressive Symptoms: insomnia, disturbed sleep,  (Hypo) Manic Symptoms:   Elevated Mood:  No Irritable Mood:  No Grandiosity:  No Distractibility:  Yes Labiality of Mood:  No Delusions:  No Hallucinations:  No Impulsivity:  No Sexually Inappropriate Behavior:  No Financial Extravagance:  No Flight of Ideas:  No  Anxiety Symptoms: Excessive Worry:  No Panic Symptoms:  No Agoraphobia:  No Obsessive Compulsive: No  Symptoms: None, Specific Phobias:  No Social Anxiety:  No  Psychotic Symptoms:  Hallucinations: No None Delusions:  No Paranoia:  No   Ideas of Reference:  No  PTSD Symptoms: Ever had a traumatic exposure:  No Had a traumatic exposure in the last month:  No Re-experiencing: No None Hypervigilance:  No Hyperarousal: No None Avoidance: No None  Traumatic Brain Injury: No  Past Psychiatric History: Diagnosis: Mild anxiety    Hospitalizations: None   Outpatient Care: None   Substance Abuse Care: None   Self-Mutilation: None   Suicidal Attempts: None   Violent Behaviors: None    Past Medical History:   Past Medical History  Diagnosis Date  . ADHD (attention deficit hyperactivity disorder)   . Hot flashes, menopausal   . Gastric reflux    History of Loss of Consciousness:  No Seizure History:  No Cardiac History:  No Allergies:   Allergies  Allergen Reactions  . Penicillins   . Shellfish Allergy   . Sulfa Antibiotics    Current Medications:  Current Outpatient Prescriptions  Medication Sig Dispense Refill  . amphetamine-dextroamphetamine (ADDERALL) 20 MG tablet Take 1 tablet (20 mg total) by mouth 2 (two) times daily.  60 tablet  0  . EPINEPHrine (EPI-PEN) 0.3 mg/0.3 mL SOAJ injection       . ketoconazole (NIZORAL) 2 % cream       . pantoprazole (PROTONIX) 40 MG tablet Take 40 mg by mouth 2 (two) times daily.       Marland Kitchen amphetamine-dextroamphetamine (ADDERALL) 20 MG tablet Take 1 tablet (20 mg total) by mouth 2 (two) times daily.  60 tablet  0  . citalopram (CELEXA) 40 MG tablet Take 1 tablet (40 mg total) by mouth daily.  30 tablet  2  . traZODone (DESYREL) 50 MG tablet Take 1 tablet (50 mg total) by mouth at bedtime as needed.  30 tablet  2   No current facility-administered medications for this visit.    Previous Psychotropic Medications:  Medication Dose   Celexa   40 mg every morning   Trazodone   50 mg each bedtime                   Substance Abuse History in the last 12 months: Substance Age of 1st Use Last Use Amount Specific Type  Nicotine      Alcohol      Cannabis      Opiates      Cocaine      Methamphetamines      LSD      Ecstasy      Benzodiazepines      Caffeine      Inhalants      Others:                          Medical Consequences of Substance Abuse: n/a  Legal Consequences of Substance Abuse:n/a  Family Consequences of Substance Abuse:  n/a  Blackouts:  No DT's:  No Withdrawal Symptoms:  No None  Social History: Current Place of Residence: [place] [address] of Birth: [place] Family Members: Husband, daughter niece and nephew Marital Status:  Married Children:   Sons: 1  Daughters:  Relationships:  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 Legal History: None Hobbies/Interests: Unknown  Family History:   Family History  Problem Relation Age of Onset  . Alcohol abuse Father   . ADD / ADHD Other   . ADD / ADHD Other     Mental Status Examination/Evaluation: Objective:  Appearance: Neat and Well Groomed  Eye Contact::  Good  Speech:  Normal Rate  Volume:  Normal  Mood anxious and depressed   Affect:  Constricted   Thought Process:  Negative  Orientation:  Full (Time, Place, and Person)  Thought Content:  Negative  Suicidal Thoughts:  No  Homicidal Thoughts:  No  Judgement:  Good  Insight:  Good  Psychomotor Activity:  Normal  Akathisia:  No  Handed:  Right  AIMS (if indicated):    Assets:  Communication Skills Desire for Improvement Social Support Vocational/Educational    Laboratory/X-Ray Psychological Evaluation(s)        Assessment:  Axis I: ADHD, inattentive type  AXIS I ADHD, inattentive type  AXIS II Deferred  AXIS III Past Medical History  Diagnosis Date  . ADHD (attention deficit hyperactivity disorder)   . Hot flashes, menopausal   . Gastric reflux      AXIS IV other psychosocial or environmental problems  AXIS V 51-60 moderate symptoms   Treatment Plan/Recommendations:  Plan of Care: Medication management   Laboratory:    Psychotherapy: She'll begin the see Florencia Reasons here   Medications:. She will continue Adderall 20 mg every morning and noon. She'll start Celexa 40 mg daily and trazodone 50 mg each bedtime   Routine PRN Medications:  No  Consultations:   Safety Concerns:    Other:  She'll return in four-weeks    Diannia Ruder, MD 9/10/20154:05 PM

## 2014-03-01 ENCOUNTER — Ambulatory Visit (HOSPITAL_COMMUNITY): Payer: Self-pay | Admitting: Psychiatry

## 2014-03-21 ENCOUNTER — Ambulatory Visit (HOSPITAL_COMMUNITY): Payer: Self-pay | Admitting: Psychiatry

## 2014-04-12 ENCOUNTER — Ambulatory Visit (INDEPENDENT_AMBULATORY_CARE_PROVIDER_SITE_OTHER): Payer: BC Managed Care – PPO | Admitting: Psychiatry

## 2014-04-12 ENCOUNTER — Encounter (HOSPITAL_COMMUNITY): Payer: Self-pay | Admitting: Psychiatry

## 2014-04-12 VITALS — BP 160/87 | HR 92 | Ht 65.0 in | Wt 200.0 lb

## 2014-04-12 DIAGNOSIS — F900 Attention-deficit hyperactivity disorder, predominantly inattentive type: Secondary | ICD-10-CM

## 2014-04-12 DIAGNOSIS — F988 Other specified behavioral and emotional disorders with onset usually occurring in childhood and adolescence: Secondary | ICD-10-CM

## 2014-04-12 MED ORDER — AMPHETAMINE-DEXTROAMPHETAMINE 20 MG PO TABS: 20.0000 mg | ORAL_TABLET | Freq: Two times a day (BID) | ORAL | Status: DC

## 2014-04-12 MED ORDER — CITALOPRAM HYDROBROMIDE 40 MG PO TABS: 40.0000 mg | ORAL_TABLET | Freq: Every day | ORAL | Status: DC

## 2014-04-12 MED ORDER — TRAZODONE HCL 50 MG PO TABS: 50.0000 mg | ORAL_TABLET | Freq: Every evening | ORAL | Status: DC | PRN

## 2014-04-12 NOTE — Progress Notes (Signed)
Patient ID: Berenise Kluge, female   DOB: 23-Jul-1960, 53 y.o.   MRN: [id number] Patient ID: Ercilia Resch, female   DOB: 11/24/1960, 53 y.o.   MRN: [id number] Patient ID: Merav Burchill, female   DOB: 1960-10-21, 53 y.o.   MRN: [id number] Patient ID: Mawa Pitzer, female   DOB: 12-17-1960, 53 y.o.   MRN: [id number] Patient ID: Maliea Horger, female   DOB: [birth year], 53 y.o.   MRN: [id number] Patient ID: Jaszmin Dietert, female   DOB: April 28, 1961, 53 y.o.   MRN: [id number] Patient ID: Jennalee Amon, female   DOB: May 21, 1961, 53 y.o.   MRN: [id number] Patient ID: Mignonne Reising, female   DOB: [birth year], 53 y.o.   MRN: [id number]  Psychiatric Assessment Adult  Patient Identification:  Andalyn Wemyss Date of Evaluation:  04/12/2014 Chief Complaint: "I can't stay focused or complete tasks." History of Chief Complaint:   Chief Complaint  Patient presents with  . Anxiety  . Depression  . 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   The patient is self-referred. I treated her great niece for ADHD and we have met through that connection.  The patient states that she's not significantly depressed or anxious at the present time. She was anxious for a while and her primary Dr. put her on citalopram which has been helpful. She is going through menopause and has difficulty sleeping. She often has hot flashes and night sweats. Her gynecologist put her on progesterone but she has stopped it because she is concerned about side effects. The trazodone helps to some degree.  Recently  she's unable to complete her work shift. She's scattered and can't complete tasks. She'll start one thing and go to another.  She's very unfocused at home and work. She denies been critically worried about anything. Her 20 year old son as a criminal history but she feels like she's done everything she can do to help him. Her marriage is going well. Her niece does have ADHD and has difficulties in school but we are trying to manage this with her here. She denies anything like suicidal ideation or psychotic symptoms but simply can't stay on task.  The patient returns after 2 months. She is back taking off her medications and she feels better. Her mood is improved. She's able to focus on it she is back on Adderall. Her great niece is still staying in [place] with her sister and the girl is not doing well. The patient has primary custody and she's going to get her back at Thanksgiving. She still quite worried about this Review of Systems  Constitutional: Positive for diaphoresis.   Physical Exam not done  Depressive Symptoms: insomnia, disturbed sleep,  (Hypo) Manic Symptoms:   Elevated Mood:  No Irritable Mood:  No Grandiosity:  No Distractibility:  Yes Labiality of Mood:  No Delusions:  No Hallucinations:  No Impulsivity:  No Sexually Inappropriate Behavior:  No Financial Extravagance:  No Flight of Ideas:  No  Anxiety Symptoms: Excessive Worry:  No Panic Symptoms:  No Agoraphobia:  No Obsessive Compulsive: No  Symptoms: None, Specific Phobias:  No Social Anxiety:  No  Psychotic Symptoms:  Hallucinations: No None Delusions:  No Paranoia:  No   Ideas of Reference:  No  PTSD Symptoms: Ever had a traumatic  exposure:  No Had a traumatic exposure in the last month:  No Re-experiencing: No None Hypervigilance:  No Hyperarousal: No None Avoidance: No None  Traumatic Brain Injury: No  Past Psychiatric History: Diagnosis: Mild anxiety   Hospitalizations: None   Outpatient Care: None   Substance Abuse Care: None   Self-Mutilation: None   Suicidal Attempts: None   Violent Behaviors: None     Past Medical History:   Past Medical History  Diagnosis Date  . ADHD (attention deficit hyperactivity disorder)   . Hot flashes, menopausal   . Gastric reflux    History of Loss of Consciousness:  No Seizure History:  No Cardiac History:  No Allergies:   Allergies  Allergen Reactions  . Penicillins   . Shellfish Allergy   . Sulfa Antibiotics    Current Medications:  Current Outpatient Prescriptions  Medication Sig Dispense Refill  . amphetamine-dextroamphetamine (ADDERALL) 20 MG tablet Take 1 tablet (20 mg total) by mouth 2 (two) times daily.  60 tablet  0  . citalopram (CELEXA) 40 MG tablet Take 1 tablet (40 mg total) by mouth daily.  30 tablet  2  . EPINEPHrine (EPI-PEN) 0.3 mg/0.3 mL SOAJ injection       . ketoconazole (NIZORAL) 2 % cream       . pantoprazole (PROTONIX) 40 MG tablet Take 40 mg by mouth 2 (two) times daily.       . traZODone (DESYREL) 50 MG tablet Take 1 tablet (50 mg total) by mouth at bedtime as needed.  30 tablet  2  . amphetamine-dextroamphetamine (ADDERALL) 20 MG tablet Take 1 tablet (20 mg total) by mouth 2 (two) times daily.  60 tablet  0   No current facility-administered medications for this visit.    Previous Psychotropic Medications:  Medication Dose   Celexa   40 mg every morning   Trazodone   50 mg each bedtime                   Substance Abuse History in the last 12 months: Substance Age of 1st Use Last Use Amount Specific Type  Nicotine      Alcohol      Cannabis      Opiates      Cocaine      Methamphetamines      LSD      Ecstasy      Benzodiazepines      Caffeine      Inhalants      Others:                          Medical Consequences of Substance Abuse: n/a  Legal Consequences of Substance Abuse:n/a  Family Consequences of Substance Abuse: n/a  Blackouts:  No DT's:  No Withdrawal Symptoms:  No None  Social History: Current Place of Residence: [place] [address] of Birth: [place] Family  Members: Husband, daughter niece and nephew Marital Status:  Married Children:   Sons: 1  Daughters:  Relationships:  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 Legal History: None Hobbies/Interests: Unknown  Family History:   Family History  Problem Relation Age of Onset  . Alcohol abuse Father   . ADD / ADHD Other   .  ADD / ADHD Other     Mental Status Examination/Evaluation: Objective:  Appearance: Neat and Well Groomed  Eye Contact::  Good  Speech:  Normal Rate  Volume:  Normal  Mood fairly good   Affect: Congruent   Thought Process:  Negative  Orientation:  Full (Time, Place, and Person)  Thought Content:  Negative  Suicidal Thoughts:  No  Homicidal Thoughts:  No  Judgement:  Good  Insight:  Good  Psychomotor Activity:  Normal  Akathisia:  No  Handed:  Right  AIMS (if indicated):    Assets:  Communication Skills Desire for Improvement Social Support Vocational/Educational    Laboratory/X-Ray Psychological Evaluation(s)        Assessment:  Axis I: ADHD, inattentive type  AXIS I ADHD, inattentive type  AXIS II Deferred  AXIS III Past Medical History  Diagnosis Date  . ADHD (attention deficit hyperactivity disorder)   . Hot flashes, menopausal   . Gastric reflux      AXIS IV other psychosocial or environmental problems  AXIS V 51-60 moderate symptoms   Treatment Plan/Recommendations:  Plan of Care: Medication management   Laboratory:    Psychotherapy: She'll begin the see Florencia Reasons here   Medications:. She will continue Adderall 20 mg every morning and noon and  Celexa 40 mg daily and trazodone 50 mg each bedtime   Routine PRN Medications:  No  Consultations:    Safety Concerns:    Other:  She'll return in 2 months     Diannia Ruder, MD 10/23/20153:24 PM

## 2014-05-31 ENCOUNTER — Encounter (HOSPITAL_COMMUNITY): Payer: Self-pay | Admitting: Psychiatry

## 2014-05-31 ENCOUNTER — Ambulatory Visit (INDEPENDENT_AMBULATORY_CARE_PROVIDER_SITE_OTHER): Payer: BC Managed Care – PPO | Admitting: Psychiatry

## 2014-05-31 VITALS — BP 150/82 | Wt 200.0 lb

## 2014-05-31 DIAGNOSIS — F900 Attention-deficit hyperactivity disorder, predominantly inattentive type: Secondary | ICD-10-CM

## 2014-05-31 DIAGNOSIS — F988 Other specified behavioral and emotional disorders with onset usually occurring in childhood and adolescence: Secondary | ICD-10-CM

## 2014-05-31 MED ORDER — AMPHETAMINE-DEXTROAMPHETAMINE 20 MG PO TABS: 20.0000 mg | ORAL_TABLET | Freq: Two times a day (BID) | ORAL | Status: DC

## 2014-05-31 MED ORDER — CITALOPRAM HYDROBROMIDE 40 MG PO TABS: 40.0000 mg | ORAL_TABLET | Freq: Every day | ORAL | Status: DC

## 2014-05-31 MED ORDER — TRAZODONE HCL 50 MG PO TABS: 50.0000 mg | ORAL_TABLET | Freq: Every evening | ORAL | Status: DC | PRN

## 2014-05-31 NOTE — Progress Notes (Signed)
Patient ID: Laurie Reyes, female   DOB: 11/09/1960, 53 y.o.   MRN: [id number] Patient ID: Laurie Reyes, female   DOB: February 23, 1961, 53 y.o.   MRN: [id number] Patient ID: Laurie Reyes, female   DOB: 14-Mar-1961, 53 y.o.   MRN: [id number] Patient ID: Laurie Reyes, female   DOB: [birth year], 53 y.o.   MRN: [id number] Patient ID: Laurie Reyes, female   DOB: Jul 19, 1960, 53 y.o.   MRN: [id number] Patient ID: Laurie Reyes, female   DOB: August 27, 1960, 53 y.o.   MRN: [id number] Patient ID: Laurie Reyes, female   DOB: 28-Oct-1960, 53 y.o.   MRN: [id number] Patient ID: Laurie Reyes, female   DOB: Sep 09, 1960, 53 y.o.   MRN: [id number] Patient ID: Laurie Reyes, female   DOB: [birth year], 53 y.o.   MRN: [id number]  Psychiatric Assessment Adult  Patient Identification:  Laurie Reyes Date of Evaluation:  05/31/2014 Chief Complaint: "I can't stay focused or complete tasks." History of Chief Complaint:   Chief Complaint  Patient presents with  . Depression  . ADHD  . 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   The patient is self-referred. I treated her great niece for ADHD and we have met through that connection.  The patient states that she's not significantly depressed or anxious at the present time. She was anxious for a while and her primary Dr. put her on citalopram which has been helpful. She is going through menopause and has difficulty sleeping. She often has hot flashes and night sweats. Her gynecologist put her on progesterone but she has stopped it because she is concerned about side effects. The trazodone helps to some degree.  Recently  she's unable to complete her work shift.  She's scattered and can't complete tasks. She'll start one thing and go to another. She's very unfocused at home and work. She denies been critically worried about anything. Her 27 year old son as a criminal history but she feels like she's done everything she can do to help him. Her marriage is going well. Her niece does have ADHD and has difficulties in school but we are trying to manage this with her here. She denies anything like suicidal ideation or psychotic symptoms but simply can't stay on task.  The patient returns after 2 months. She has bad laryngitis and is difficult to understand today. She still unhappy because her great-niece is still living with her sister in [place]. The child is doing well but she's not sure she can bring her back because her custody orders from [place]. It has been hard for her to stay focused because she ruminates about this a lot. Her mood is fairly stable and she is sleeping well. Review of Systems  Constitutional: Positive for diaphoresis.   Physical Exam not done  Depressive Symptoms: insomnia, disturbed sleep,  (Hypo) Manic Symptoms:   Elevated Mood:  No Irritable Mood:  No Grandiosity:  No Distractibility:  Yes Labiality of Mood:  No Delusions:  No Hallucinations:  No Impulsivity:  No Sexually Inappropriate Behavior:  No Financial Extravagance:  No Flight of Ideas:  No  Anxiety Symptoms: Excessive Worry:  No Panic Symptoms:  No Agoraphobia:  No Obsessive Compulsive: No  Symptoms: None, Specific Phobias:  No Social Anxiety:  No  Psychotic Symptoms:  Hallucinations: No  None Delusions:  No Paranoia:  No   Ideas of Reference:  No  PTSD Symptoms: Ever had a traumatic exposure:  No Had a traumatic exposure in the last month:  No Re-experiencing: No None Hypervigilance:  No Hyperarousal: No None Avoidance: No None  Traumatic Brain Injury: No  Past Psychiatric History: Diagnosis: Mild anxiety   Hospitalizations: None    Outpatient Care: None   Substance Abuse Care: None   Self-Mutilation: None   Suicidal Attempts: None   Violent Behaviors: None    Past Medical History:   Past Medical History  Diagnosis Date  . ADHD (attention deficit hyperactivity disorder)   . Hot flashes, menopausal   . Gastric reflux    History of Loss of Consciousness:  No Seizure History:  No Cardiac History:  No Allergies:   Allergies  Allergen Reactions  . Penicillins   . Shellfish Allergy   . Sulfa Antibiotics    Current Medications:  Current Outpatient Prescriptions  Medication Sig Dispense Refill  . amphetamine-dextroamphetamine (ADDERALL) 20 MG tablet Take 1 tablet (20 mg total) by mouth 2 (two) times daily. Do not fill before 07/01/14 60 tablet 0  . amphetamine-dextroamphetamine (ADDERALL) 20 MG tablet Take 1 tablet (20 mg total) by mouth 2 (two) times daily. 60 tablet 0  . citalopram (CELEXA) 40 MG tablet Take 1 tablet (40 mg total) by mouth daily. 30 tablet 2  . EPINEPHrine (EPI-PEN) 0.3 mg/0.3 mL SOAJ injection     . ketoconazole (NIZORAL) 2 % cream     . pantoprazole (PROTONIX) 40 MG tablet Take 40 mg by mouth 2 (two) times daily.     . traZODone (DESYREL) 50 MG tablet Take 1 tablet (50 mg total) by mouth at bedtime as needed. 30 tablet 2   No current facility-administered medications for this visit.    Previous Psychotropic Medications:  Medication Dose   Celexa   40 mg every morning   Trazodone   50 mg each bedtime                   Substance Abuse History in the last 12 months: Substance Age of 1st Use Last Use Amount Specific Type  Nicotine      Alcohol      Cannabis      Opiates      Cocaine      Methamphetamines      LSD      Ecstasy      Benzodiazepines      Caffeine      Inhalants      Others:                          Medical Consequences of Substance Abuse: n/a  Legal Consequences of Substance Abuse:n/a  Family Consequences of Substance Abuse: n/a  Blackouts:   No DT's:  No Withdrawal Symptoms:  No None  Social History: Current Place of Residence: [place] [address] of Birth: [place] Family Members: Husband, daughter niece and nephew Marital Status:  Married Children:   Sons: 1  Daughters:  Relationships:  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 Legal History: None Hobbies/Interests: Unknown  Family History:   Family History  Problem Relation Age of Onset  .  Alcohol abuse Father   . ADD / ADHD Other   . ADD / ADHD Other     Mental Status Examination/Evaluation: Objective:  Appearance: Neat and Well Groomed  Eye Contact::  Good  Speech:  Normal Rate  Volume:  Normal  Mood fairly good   Affect: Congruent   Thought Process:  Negative  Orientation:  Full (Time, Place, and Person)  Thought Content:  Negative  Suicidal Thoughts:  No  Homicidal Thoughts:  No  Judgement:  Good  Insight:  Good  Psychomotor Activity:  Normal  Akathisia:  No  Handed:  Right  AIMS (if indicated):    Assets:  Communication Skills Desire for Improvement Social Support Vocational/Educational    Laboratory/X-Ray Psychological Evaluation(s)        Assessment:  Axis I: ADHD, inattentive type  AXIS I ADHD, inattentive type  AXIS II Deferred  AXIS III Past Medical History  Diagnosis Date  . ADHD (attention deficit hyperactivity disorder)   . Hot flashes, menopausal   . Gastric reflux      AXIS IV other psychosocial or environmental problems  AXIS V 51-60 moderate symptoms   Treatment Plan/Recommendations:  Plan of Care: Medication management   Laboratory:    Psychotherapy: She'll begin the see Florencia Reasons here   Medications:. She  will continue Adderall 20 mg every morning and noon and  Celexa 40 mg daily and trazodone 50 mg each bedtime   Routine PRN Medications:  No  Consultations:   Safety Concerns:    Other:  She'll return in 2 months     Diannia Ruder, MD 12/11/20152:04 PM

## 2014-06-07 ENCOUNTER — Ambulatory Visit (HOSPITAL_COMMUNITY): Payer: Self-pay | Admitting: Psychiatry

## 2014-08-01 ENCOUNTER — Ambulatory Visit (HOSPITAL_COMMUNITY): Payer: Self-pay | Admitting: Psychiatry

## 2014-08-05 ENCOUNTER — Ambulatory Visit (HOSPITAL_COMMUNITY): Payer: Self-pay | Admitting: Psychiatry

## 2014-08-16 ENCOUNTER — Other Ambulatory Visit (HOSPITAL_COMMUNITY): Payer: Self-pay | Admitting: Obstetrics and Gynecology

## 2014-08-16 DIAGNOSIS — Z1231 Encounter for screening mammogram for malignant neoplasm of breast: Secondary | ICD-10-CM

## 2014-09-16 ENCOUNTER — Ambulatory Visit (HOSPITAL_COMMUNITY)
Admission: RE | Admit: 2014-09-16 | Discharge: 2014-09-16 | Disposition: A | Discharge status: Home / Self Care | Payer: BLUE CROSS/BLUE SHIELD | Source: Ambulatory Visit | Attending: Obstetrics and Gynecology | Admitting: Obstetrics and Gynecology

## 2014-09-16 ENCOUNTER — Ambulatory Visit (HOSPITAL_COMMUNITY): Payer: Self-pay

## 2014-09-16 DIAGNOSIS — Z1231 Encounter for screening mammogram for malignant neoplasm of breast: Secondary | ICD-10-CM

## 2014-09-20 ENCOUNTER — Encounter (HOSPITAL_COMMUNITY): Payer: Self-pay | Admitting: Psychiatry

## 2014-09-20 ENCOUNTER — Ambulatory Visit (INDEPENDENT_AMBULATORY_CARE_PROVIDER_SITE_OTHER): Payer: BLUE CROSS/BLUE SHIELD | Admitting: Psychiatry

## 2014-09-20 VITALS — BP 123/77 | HR 74 | Ht 65.0 in | Wt 205.6 lb

## 2014-09-20 DIAGNOSIS — F900 Attention-deficit hyperactivity disorder, predominantly inattentive type: Secondary | ICD-10-CM

## 2014-09-20 DIAGNOSIS — F988 Other specified behavioral and emotional disorders with onset usually occurring in childhood and adolescence: Secondary | ICD-10-CM

## 2014-09-20 MED ORDER — AMPHETAMINE-DEXTROAMPHETAMINE 20 MG PO TABS: 20.0000 mg | ORAL_TABLET | Freq: Two times a day (BID) | ORAL | Status: DC

## 2014-09-20 MED ORDER — TRAZODONE HCL 100 MG PO TABS: 100.0000 mg | ORAL_TABLET | Freq: Every day | ORAL | Status: DC

## 2014-09-20 MED ORDER — CITALOPRAM HYDROBROMIDE 40 MG PO TABS: 40.0000 mg | ORAL_TABLET | Freq: Every day | ORAL | Status: DC

## 2014-09-20 NOTE — Progress Notes (Signed)
Patient ID: Laurie Reyes, female   DOB: 1960/10/02, 54 y.o.   MRN: [id number] Patient ID: Laurie Reyes, female   DOB: [birth year], 54 y.o.   MRN: [id number] Patient ID: Laurie Reyes, female   DOB: 07-Aug-1960, 54 y.o.   MRN: [id number] Patient ID: Laurie Reyes, female   DOB: 09-11-1960, 54 y.o.   MRN: [id number] Patient ID: Laurie Reyes, female   DOB: [birth year], 54 y.o.   MRN: [id number] Patient ID: Laurie Reyes, female   DOB: 27-Jun-1960, 54 y.o.   MRN: [id number] Patient ID: Laurie Reyes, female   DOB: 01/21/1961, 54 y.o.   MRN: [id number] Patient ID: Laurie Reyes, female   DOB: 1960-12-29, 54 y.o.   MRN: [id number] Patient ID: Laurie Reyes, female   DOB: [birth year], 54 y.o.   MRN: [id number] Patient ID: Laurie Reyes, female   DOB: 12-01-1960, 54 y.o.   MRN: [id number]  Psychiatric Assessment Adult  Patient Identification:  Laurie Reyes Date of Evaluation:  09/20/2014 Chief Complaint: "I can't stay focused or complete tasks." History of Chief Complaint:   Chief Complaint  Patient presents with  . Depression  . ADD  . 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   The patient is self-referred. I treated her great niece for ADHD and we have met through that connection.  The patient states that she's not significantly depressed or anxious at the present time. She was anxious for a while and her primary Dr. put her on citalopram which has been helpful. She is going through menopause and has difficulty sleeping. She often has hot flashes and night sweats. Her gynecologist put her on progesterone but she has stopped it because she is concerned about side effects. The trazodone helps to some degree.  Recently  she's unable to complete her work  shift. She's scattered and can't complete tasks. She'll start one thing and go to another. She's very unfocused at home and work. She denies been critically worried about anything. Her 40 year old son as a criminal history but she feels like she's done everything she can do to help him. Her marriage is going well. Her niece does have ADHD and has difficulties in school but we are trying to manage this with her here. She denies anything like suicidal ideation or psychotic symptoms but simply can't stay on task.  The patient returns after 4 months. She relates a lot of stressors recently. Her husband had prostate surgery and now is dealing with erectile dysfunction. She herself has had a uterine ablation because her insurance denied a hysterectomy. She's had a lot of bleeding and fibroids. Her workload has increased and she feels stressed at work. She's not sleeping very well. I suggested we work on her sleep by increasing her sleep medication also by increasing exercise daily. She is agreeable to this. Her great niece is going to come back to live here the summer and she is excited about it  Review of Systems  Constitutional: Positive for diaphoresis.   Physical Exam not done  Depressive Symptoms: insomnia, disturbed sleep,  (Hypo) Manic Symptoms:   Elevated Mood:  No Irritable Mood:  No Grandiosity:  No Distractibility:  Yes Labiality of Mood:  No Delusions:  No Hallucinations:  No Impulsivity:  No Sexually Inappropriate Behavior:  No Financial Extravagance:  No Flight of Ideas:  No  Anxiety Symptoms: Excessive Worry:  No Panic Symptoms:  No Agoraphobia:  No Obsessive Compulsive: No  Symptoms: None, Specific Phobias:  No Social Anxiety:  No  Psychotic Symptoms:  Hallucinations: No None Delusions:  No Paranoia:  No   Ideas of Reference:  No  PTSD Symptoms: Ever had a traumatic exposure:  No Had a traumatic exposure in the last month:  No Re-experiencing: No  None Hypervigilance:  No Hyperarousal: No None Avoidance: No None  Traumatic Brain Injury: No  Past Psychiatric History: Diagnosis: Mild anxiety   Hospitalizations: None   Outpatient Care: None   Substance Abuse Care: None   Self-Mutilation: None   Suicidal Attempts: None   Violent Behaviors: None    Past Medical History:   Past Medical History  Diagnosis Date  . ADHD (attention deficit hyperactivity disorder)   . Hot flashes, menopausal   . Gastric reflux    History of Loss of Consciousness:  No Seizure History:  No Cardiac History:  No Allergies:   Allergies  Allergen Reactions  . Penicillins   . Shellfish Allergy   . Sulfa Antibiotics    Current Medications:  Current Outpatient Prescriptions  Medication Sig Dispense Refill  . amphetamine-dextroamphetamine (ADDERALL) 20 MG tablet Take 1 tablet (20 mg total) by mouth 2 (two) times daily. 60 tablet 0  . citalopram (CELEXA) 40 MG tablet Take 1 tablet (40 mg total) by mouth daily. 30 tablet 2  . EPINEPHrine (EPI-PEN) 0.3 mg/0.3 mL SOAJ injection     . ketoconazole (NIZORAL) 2 % cream     . pantoprazole (PROTONIX) 40 MG tablet Take 40 mg by mouth 2 (two) times daily.     Marland Kitchen amphetamine-dextroamphetamine (ADDERALL) 20 MG tablet Take 1 tablet (20 mg total) by mouth 2 (two) times daily. 60 tablet 0  . traZODone (DESYREL) 100 MG tablet Take 1 tablet (100 mg total) by mouth at bedtime. 30 tablet 2   No current facility-administered medications for this visit.    Previous Psychotropic Medications:  Medication Dose   Celexa   40 mg every morning   Trazodone   50 mg each bedtime                   Substance Abuse History in the last 12 months: Substance Age of 1st Use Last Use Amount Specific Type  Nicotine      Alcohol      Cannabis      Opiates      Cocaine      Methamphetamines      LSD      Ecstasy      Benzodiazepines      Caffeine      Inhalants      Others:                          Medical  Consequences of Substance Abuse: n/a  Legal Consequences of Substance Abuse:n/a  Family Consequences of Substance Abuse: n/a  Blackouts:  No DT's:  No Withdrawal Symptoms:  No None  Social History: Current Place of Residence: [place] [address] of Birth: [place] Family Members: Husband, daughter niece and nephew Marital Status:  Married Children:   Sons: 1  Daughters:  Relationships:  Education:  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 Legal History: None Hobbies/Interests: Unknown  Family History:   Family History  Problem Relation Age of Onset  . Alcohol abuse Father   . ADD / ADHD Other   . ADD / ADHD Other     Mental Status Examination/Evaluation: Objective:  Appearance: Neat and Well Groomed  Eye Contact::  Good  Speech:  Normal Rate  Volume:  Normal  Mood fairly good but seems more stressed   Affect: Congruent   Thought Process:  Negative  Orientation:  Full (Time, Place, and Person)  Thought Content:  Negative  Suicidal Thoughts:  No  Homicidal Thoughts:  No  Judgement:  Good  Insight:  Good  Psychomotor Activity:  Normal  Akathisia:  No  Handed:  Right  AIMS (if indicated):    Assets:  Communication Skills Desire for Improvement Social Support Vocational/Educational    Laboratory/X-Ray Psychological Evaluation(s)        Assessment:  Axis I: ADHD, inattentive type  AXIS I ADHD, inattentive type  AXIS II Deferred  AXIS III Past Medical History  Diagnosis Date  . ADHD (attention deficit hyperactivity disorder)   . Hot flashes, menopausal   . Gastric reflux      AXIS IV other psychosocial or environmental problems  AXIS V 51-60 moderate symptoms    Treatment Plan/Recommendations:  Plan of Care: Medication management   Laboratory:    Psychotherapy: She'll begin the see Florencia Reasons here   Medications:. She will continue Adderall 20 mg every morning and noon and  Celexa 40 mg daily and increase trazodone to 100 mg daily at bedtime   Routine PRN Medications:  No  Consultations:   Safety Concerns:    Other:  She'll return in 2 months     Diannia Ruder, MD 4/1/20161:39 PM

## 2014-11-21 ENCOUNTER — Ambulatory Visit (HOSPITAL_COMMUNITY): Payer: Self-pay | Admitting: Psychiatry

## 2015-01-13 ENCOUNTER — Telehealth (HOSPITAL_COMMUNITY): Payer: Self-pay | Admitting: *Deleted

## 2015-01-13 DIAGNOSIS — F988 Other specified behavioral and emotional disorders with onset usually occurring in childhood and adolescence: Secondary | ICD-10-CM

## 2015-01-13 NOTE — Telephone Encounter (Signed)
Pt pharmacy requesting refills for pt Citalopram 40 mg QD. Pt medication was last refilled 09-20-14 with 30 tablets 2 refills. Pt rescheduled appt for 02-14-15. Pt pharmacy number is [phone].

## 2015-01-13 NOTE — Telephone Encounter (Signed)
Pt pharmacy requesting refills for pt Citalopram 40 mg QD. Pt medication was last refilled 09-20-14 with 30 tablets 2 refills. Pt rescheduled appt for 02-14-15. Pt pharmacy number is [phone].

## 2015-01-15 MED ORDER — CITALOPRAM HYDROBROMIDE 40 MG PO TABS: 40.0000 mg | ORAL_TABLET | Freq: Every day | ORAL | Status: DC

## 2015-01-15 NOTE — Telephone Encounter (Signed)
Met with Dr. Lovena Le who approved a one time refill of patient's prescribed Citalopram to cover patient until she returns to see Dr. Harrington Challenger on 02/14/15 as Dr. Harrington Challenger is off this week. New one time order e-scribed to patient's [employer] as approved.

## 2015-01-15 NOTE — Addendum Note (Signed)
Addended by: Watt Climes on: 01/15/2015 08:08 AM   Modules accepted: Orders

## 2015-01-16 ENCOUNTER — Telehealth (HOSPITAL_COMMUNITY): Payer: Self-pay | Admitting: *Deleted

## 2015-01-16 DIAGNOSIS — F988 Other specified behavioral and emotional disorders with onset usually occurring in childhood and adolescence: Secondary | ICD-10-CM

## 2015-01-16 NOTE — Telephone Encounter (Signed)
Pt pharmacy requesting refills for pt Trazodone 100 mg QD. Pt medication was last refilled 09-20-14 with 30 tablets 2 refills. Pt follow up appt is scheduled for 02-14-15. Pt pharmacy number is [phone].

## 2015-01-17 MED ORDER — TRAZODONE HCL 100 MG PO TABS: 100.0000 mg | ORAL_TABLET | Freq: Every day | ORAL | Status: DC

## 2015-01-17 NOTE — Addendum Note (Signed)
Addended by: Watt Climes on: 01/17/2015 12:07 PM   Modules accepted: Orders

## 2015-01-17 NOTE — Telephone Encounter (Signed)
One time refill of patient's Trazodone authorized by Dr. Dwyane Dee and e-scribed to patient's [facility].

## 2015-01-23 ENCOUNTER — Other Ambulatory Visit (HOSPITAL_COMMUNITY): Payer: Self-pay | Admitting: Psychiatry

## 2015-01-23 DIAGNOSIS — F988 Other specified behavioral and emotional disorders with onset usually occurring in childhood and adolescence: Secondary | ICD-10-CM

## 2015-01-23 MED ORDER — CITALOPRAM HYDROBROMIDE 40 MG PO TABS: 40.0000 mg | ORAL_TABLET | Freq: Every day | ORAL | Status: DC

## 2015-01-23 NOTE — Telephone Encounter (Signed)
reordered

## 2015-02-14 ENCOUNTER — Encounter (HOSPITAL_COMMUNITY): Payer: Self-pay | Admitting: Psychiatry

## 2015-02-14 ENCOUNTER — Ambulatory Visit (INDEPENDENT_AMBULATORY_CARE_PROVIDER_SITE_OTHER): Payer: BLUE CROSS/BLUE SHIELD | Admitting: Psychiatry

## 2015-02-14 VITALS — BP 123/84 | HR 98 | Ht 65.0 in | Wt 205.6 lb

## 2015-02-14 DIAGNOSIS — F900 Attention-deficit hyperactivity disorder, predominantly inattentive type: Secondary | ICD-10-CM

## 2015-02-14 DIAGNOSIS — F988 Other specified behavioral and emotional disorders with onset usually occurring in childhood and adolescence: Secondary | ICD-10-CM

## 2015-02-14 MED ORDER — AMPHETAMINE-DEXTROAMPHETAMINE 20 MG PO TABS: 20.0000 mg | ORAL_TABLET | Freq: Two times a day (BID) | ORAL | Status: DC

## 2015-02-14 MED ORDER — CITALOPRAM HYDROBROMIDE 40 MG PO TABS: 40.0000 mg | ORAL_TABLET | Freq: Every day | ORAL | Status: DC

## 2015-02-14 MED ORDER — AMPHETAMINE-DEXTROAMPHETAMINE 20 MG PO TABS
20.0000 mg | ORAL_TABLET | Freq: Two times a day (BID) | ORAL | Status: DC
Start: 2015-02-14 — End: 2015-04-21

## 2015-02-14 MED ORDER — TRAZODONE HCL 100 MG PO TABS: 100.0000 mg | ORAL_TABLET | Freq: Every day | ORAL | Status: DC

## 2015-02-14 NOTE — Progress Notes (Signed)
Patient ID: Laurie Reyes, female   DOB: 09/14/1960, 54 y.o.   MRN: [id number] Patient ID: Laurie Reyes, female   DOB: 09-09-1960, 54 y.o.   MRN: [id number] Patient ID: Laurie Reyes, female   DOB: 1960-10-11, 54 y.o.   MRN: [id number] Patient ID: Laurie Reyes, female   DOB: Apr 05, 1961, 54 y.o.   MRN: [id number] Patient ID: Laurie Reyes, female   DOB: 03-Jan-1961, 54 y.o.   MRN: [id number] Patient ID: Laurie Reyes, female   DOB: 1961/03/06, 54 y.o.   MRN: [id number] Patient ID: Laurie Reyes, female   DOB: 27-Jul-1960, 54 y.o.   MRN: [id number] Patient ID: Laurie Reyes, female   DOB: Apr 28, 1961, 54 y.o.   MRN: [id number] Patient ID: Laurie Reyes, female   DOB: Mar 30, 1961, 54 y.o.   MRN: [id number] Patient ID: Laurie Reyes, female   DOB: [birth year], 54 y.o.   MRN: [id number] Patient ID: Laurie Reyes, female   DOB: 09-Nov-1960, 54 y.o.   MRN: [id number]  Psychiatric Assessment Adult  Patient Identification:  Laurie Reyes Date of Evaluation:  02/14/2015 Chief Complaint: "I can't stay focused or complete tasks." History of Chief Complaint:   Chief Complaint  Patient presents with  . Depression  . ADD  . Follow-up    Depression        Past medical history includes anxiety.   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   The patient is self-referred. I treated her great niece for ADHD and we have met through that connection.  The patient states that she's not significantly depressed or anxious at the present time. She was anxious for a while and her primary Dr. put her on citalopram which has been helpful. She is going through menopause and has difficulty sleeping. She often has hot flashes and night sweats. Her gynecologist put her on progesterone  but she has stopped it because she is concerned about side effects. The trazodone helps to some degree.  Recently  she's unable to complete her work shift. She's scattered and can't complete tasks. She'll start one thing and go to another. She's very unfocused at home and work. She denies been critically worried about anything. Her 17 year old son as a criminal history but she feels like she's done everything she can do to help him. Her marriage is going well. Her niece does have ADHD and has difficulties in school but we are trying to manage this with her here. She denies anything like suicidal ideation or psychotic symptoms but simply can't stay on task.  The patient returns after 4 months. She is doing okay for the most part. She has custody of her 11 year old great niece. The niece has chosen to stay with her biological mother and grandmother in [place] in and she is not doing well there. The patient is very worried about this niece because the niece is failing in school has poor supervision and she feels that the family there is neglecting her. She may go back and get her after couple months of school if things don't improve. Overall her mood is fairly good and she sleeps well at night. The Adderall helps her focus but she still somewhat preoccupied about the situation with the niece Review of Systems  Constitutional: Positive for diaphoresis.  Psychiatric/Behavioral: Positive for depression.   Physical Exam not done  Depressive Symptoms: insomnia, disturbed sleep,  (  Hypo) Manic Symptoms:   Elevated Mood:  No Irritable Mood:  No Grandiosity:  No Distractibility:  Yes Labiality of Mood:  No Delusions:  No Hallucinations:  No Impulsivity:  No Sexually Inappropriate Behavior:  No Financial Extravagance:  No Flight of Ideas:  No  Anxiety Symptoms: Excessive Worry:  No Panic Symptoms:  No Agoraphobia:  No Obsessive Compulsive: No  Symptoms: None, Specific Phobias:  No Social  Anxiety:  No  Psychotic Symptoms:  Hallucinations: No None Delusions:  No Paranoia:  No   Ideas of Reference:  No  PTSD Symptoms: Ever had a traumatic exposure:  No Had a traumatic exposure in the last month:  No Re-experiencing: No None Hypervigilance:  No Hyperarousal: No None Avoidance: No None  Traumatic Brain Injury: No  Past Psychiatric History: Diagnosis: Mild anxiety   Hospitalizations: None   Outpatient Care: None   Substance Abuse Care: None   Self-Mutilation: None   Suicidal Attempts: None   Violent Behaviors: None    Past Medical History:   Past Medical History  Diagnosis Date  . ADHD (attention deficit hyperactivity disorder)   . Hot flashes, menopausal   . Gastric reflux    History of Loss of Consciousness:  No Seizure History:  No Cardiac History:  No Allergies:   Allergies  Allergen Reactions  . Penicillins   . Shellfish Allergy   . Sulfa Antibiotics    Current Medications:  Current Outpatient Prescriptions  Medication Sig Dispense Refill  . amphetamine-dextroamphetamine (ADDERALL) 20 MG tablet Take 1 tablet (20 mg total) by mouth 2 (two) times daily. 60 tablet 0  . citalopram (CELEXA) 40 MG tablet Take 1 tablet (40 mg total) by mouth daily. 30 tablet 2  . EPINEPHrine (EPI-PEN) 0.3 mg/0.3 mL SOAJ injection     . ketoconazole (NIZORAL) 2 % cream     . pantoprazole (PROTONIX) 40 MG tablet Take 40 mg by mouth 2 (two) times daily.     . traZODone (DESYREL) 100 MG tablet Take 1 tablet (100 mg total) by mouth at bedtime. 30 tablet 2  . amphetamine-dextroamphetamine (ADDERALL) 20 MG tablet Take 1 tablet (20 mg total) by mouth 2 (two) times daily. 60 tablet 0  . amphetamine-dextroamphetamine (ADDERALL) 20 MG tablet Take 1 tablet (20 mg total) by mouth 2 (two) times daily. 60 tablet 0   No current facility-administered medications for this visit.    Previous Psychotropic Medications:  Medication Dose   Celexa   40 mg every morning   Trazodone   50  mg each bedtime                   Substance Abuse History in the last 12 months: Substance Age of 1st Use Last Use Amount Specific Type  Nicotine      Alcohol      Cannabis      Opiates      Cocaine      Methamphetamines      LSD      Ecstasy      Benzodiazepines      Caffeine      Inhalants      Others:                          Medical Consequences of Substance Abuse: n/a  Legal Consequences of Substance Abuse:n/a  Family Consequences of Substance Abuse: n/a  Blackouts:  No DT's:  No Withdrawal Symptoms:  No None  Social History: Current  Place of Residence: [facility] of Birth: [place] Family Members: Husband, daughter niece and nephew Marital Status:  Married Children:   Sons: 1  Daughters:  Relationships:  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 Legal History: None Hobbies/Interests: Unknown  Family History:   Family History  Problem Relation Age of Onset  . Alcohol abuse Father   . ADD / ADHD Other   . ADD / ADHD Other     Mental Status Examination/Evaluation: Objective:  Appearance: Neat and Well Groomed  Eye Contact::  Good  Speech:  Normal Rate  Volume:  Normal  Mood fairly good   Affect: Congruent   Thought Process:  Negative  Orientation:  Full (Time, Place, and Person)  Thought Content:  Negative  Suicidal Thoughts:  No  Homicidal Thoughts:  No  Judgement:  Good  Insight:  Good  Psychomotor Activity:  Normal  Akathisia:  No  Handed:  Right  AIMS (if indicated):    Assets:  Communication Skills Desire for Improvement Social Support Vocational/Educational    Laboratory/X-Ray Psychological Evaluation(s)         Assessment:  Axis I: ADHD, inattentive type  AXIS I ADHD, inattentive type  AXIS II Deferred  AXIS III Past Medical History  Diagnosis Date  . ADHD (attention deficit hyperactivity disorder)   . Hot flashes, menopausal   . Gastric reflux      AXIS IV other psychosocial or environmental problems  AXIS V 51-60 moderate symptoms   Treatment Plan/Recommendations:  Plan of Care: Medication management   Laboratory:    Psychotherapy: She'll begin the see Florencia Reasons here   Medications:. She will continue Adderall 20 mg every morning and noon for ADD and  Celexa 40 mg daily for depression and  trazodone to 100 mg daily at bedtime for sleep   Routine PRN Medications:  No  Consultations:   Safety Concerns: She denies thoughts of hurting self or others   Other:  She'll return in 3 months     Diannia Ruder, MD 8/26/20169:21 AM

## 2015-04-16 ENCOUNTER — Telehealth (HOSPITAL_COMMUNITY): Payer: Self-pay | Admitting: *Deleted

## 2015-04-16 NOTE — Telephone Encounter (Signed)
patient request statement stating what her diagnosis is and how it affects her.   She needs this for her job.

## 2015-04-16 NOTE — Telephone Encounter (Signed)
She will need to come to discuss in more detail and sign a release

## 2015-04-21 ENCOUNTER — Encounter (HOSPITAL_COMMUNITY): Payer: Self-pay | Admitting: Psychiatry

## 2015-04-21 ENCOUNTER — Ambulatory Visit (INDEPENDENT_AMBULATORY_CARE_PROVIDER_SITE_OTHER): Payer: BLUE CROSS/BLUE SHIELD | Admitting: Psychiatry

## 2015-04-21 VITALS — BP 133/84 | HR 81 | Ht 65.0 in | Wt 211.8 lb

## 2015-04-21 DIAGNOSIS — F900 Attention-deficit hyperactivity disorder, predominantly inattentive type: Secondary | ICD-10-CM | POA: Diagnosis not present

## 2015-04-21 DIAGNOSIS — F988 Other specified behavioral and emotional disorders with onset usually occurring in childhood and adolescence: Secondary | ICD-10-CM

## 2015-04-21 MED ORDER — TRAZODONE HCL 100 MG PO TABS: 100.0000 mg | ORAL_TABLET | Freq: Every day | ORAL | Status: DC

## 2015-04-21 MED ORDER — CITALOPRAM HYDROBROMIDE 40 MG PO TABS: 40.0000 mg | ORAL_TABLET | Freq: Every day | ORAL | Status: DC

## 2015-04-21 MED ORDER — LISDEXAMFETAMINE DIMESYLATE 50 MG PO CAPS: 50.0000 mg | ORAL_CAPSULE | Freq: Every day | ORAL | Status: DC

## 2015-04-21 NOTE — Progress Notes (Signed)
Patient ID: Laurie Reyes, female   DOB: 1961/02/12, 54 y.o.   MRN: [id number] Patient ID: Laurie Reyes, female   DOB: Mar 22, 1961, 54 y.o.   MRN: [id number] Patient ID: Laurie Reyes, female   DOB: [birth year], 54 y.o.   MRN: [id number] Patient ID: Laurie Reyes, female   DOB: 1961-06-15, 54 y.o.   MRN: [id number] Patient ID: Laurie Reyes, female   DOB: 1960-11-07, 54 y.o.   MRN: [id number] Patient ID: Laurie Reyes, female   DOB: 08/20/1960, 54 y.o.   MRN: [id number] Patient ID: Laurie Reyes, female   DOB: 1961/05/12, 54 y.o.   MRN: [id number] Patient ID: Laurie Reyes, female   DOB: July 27, 1960, 54 y.o.   MRN: [id number] Patient ID: Laurie Reyes, female   DOB: 13-Jul-1960, 54 y.o.   MRN: [id number] Patient ID: Laurie Reyes, female   DOB: [birth year], 54 y.o.   MRN: [id number] Patient ID: Laurie Reyes, female   DOB: 1960/11/17, 54 y.o.   MRN: [id number] Patient ID: Laurie Reyes, female   DOB: 18-Apr-1961, 54 y.o.   MRN: [id number]  Psychiatric Assessment Adult  Patient Identification:  Laurie Reyes Date of Evaluation:  04/21/2015 Chief Complaint: "I can't stay focused or complete tasks." History of Chief Complaint:   Chief Complaint  Patient presents with  . Depression  . ADD  . Follow-up    Depression        Past medical history includes anxiety.   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   The patient is self-referred. I treated her great niece for ADHD and we have met through that connection.  The patient states that she's not significantly depressed or anxious at the present time. She was anxious for a while and her primary Dr. put her on citalopram which has been helpful. She is going through menopause and has difficulty  sleeping. She often has hot flashes and night sweats. Her gynecologist put her on progesterone but she has stopped it because she is concerned about side effects. The trazodone helps to some degree.  Recently  she's unable to complete her work shift. She's scattered and can't complete tasks. She'll start one thing and go to another. She's very unfocused at home and work. She denies been critically worried about anything. Her 61 year old son as a criminal history but she feels like she's done everything she can do to help him. Her marriage is going well. Her niece does have ADHD and has difficulties in school but we are trying to manage this with her here. She denies anything like suicidal ideation or psychotic symptoms but simply can't stay on task.  The patient returns after 4 months. She has been struggling somewhat. She is worried about her 42 year old niece who is in [place] with her sister. The child is not doing well and she doesn't feel like she is adequately provided for. She's thinking she is going to go get her but her sister's going to file for custody and they are ending up in a court battle she's not been focusing is well at work. She is on Adderall 20 mg twice a day and I suggested we try Vyvanse instead and she is agreeable. She's sleeping fairly well and her mood is been good. Review of Systems  Constitutional: Positive for diaphoresis.  Psychiatric/Behavioral: Positive for depression.   Physical Exam not done  Depressive  Symptoms: insomnia, disturbed sleep,  (Hypo) Manic Symptoms:   Elevated Mood:  No Irritable Mood:  No Grandiosity:  No Distractibility:  Yes Labiality of Mood:  No Delusions:  No Hallucinations:  No Impulsivity:  No Sexually Inappropriate Behavior:  No Financial Extravagance:  No Flight of Ideas:  No  Anxiety Symptoms: Excessive Worry:  No Panic Symptoms:  No Agoraphobia:  No Obsessive Compulsive: No  Symptoms: None, Specific Phobias:  No Social  Anxiety:  No  Psychotic Symptoms:  Hallucinations: No None Delusions:  No Paranoia:  No   Ideas of Reference:  No  PTSD Symptoms: Ever had a traumatic exposure:  No Had a traumatic exposure in the last month:  No Re-experiencing: No None Hypervigilance:  No Hyperarousal: No None Avoidance: No None  Traumatic Brain Injury: No  Past Psychiatric History: Diagnosis: Mild anxiety   Hospitalizations: None   Outpatient Care: None   Substance Abuse Care: None   Self-Mutilation: None   Suicidal Attempts: None   Violent Behaviors: None    Past Medical History:   Past Medical History  Diagnosis Date  . ADHD (attention deficit hyperactivity disorder)   . Hot flashes, menopausal   . Gastric reflux    History of Loss of Consciousness:  No Seizure History:  No Cardiac History:  No Allergies:   Allergies  Allergen Reactions  . Penicillins   . Shellfish Allergy   . Sulfa Antibiotics    Current Medications:  Current Outpatient Prescriptions  Medication Sig Dispense Refill  . amphetamine-dextroamphetamine (ADDERALL) 20 MG tablet Take 1 tablet (20 mg total) by mouth 2 (two) times daily. 60 tablet 0  . citalopram (CELEXA) 40 MG tablet Take 1 tablet (40 mg total) by mouth daily. 30 tablet 2  . EPINEPHrine (EPI-PEN) 0.3 mg/0.3 mL SOAJ injection     . ketoconazole (NIZORAL) 2 % cream     . pantoprazole (PROTONIX) 40 MG tablet Take 40 mg by mouth 2 (two) times daily.     . traZODone (DESYREL) 100 MG tablet Take 1 tablet (100 mg total) by mouth at bedtime. 30 tablet 2  . lisdexamfetamine (VYVANSE) 50 MG capsule Take 1 capsule (50 mg total) by mouth daily. 30 capsule 0   No current facility-administered medications for this visit.    Previous Psychotropic Medications:  Medication Dose   Celexa   40 mg every morning   Trazodone   50 mg each bedtime                   Substance Abuse History in the last 12 months: Substance Age of 1st Use Last Use Amount Specific Type   Nicotine      Alcohol      Cannabis      Opiates      Cocaine      Methamphetamines      LSD      Ecstasy      Benzodiazepines      Caffeine      Inhalants      Others:                          Medical Consequences of Substance Abuse: n/a  Legal Consequences of Substance Abuse:n/a  Family Consequences of Substance Abuse: n/a  Blackouts:  No DT's:  No Withdrawal Symptoms:  No None  Social History: Current Place of Residence: [place] [address] of Birth: [place] Family Members: Husband, daughter niece and nephew Marital Status:  Married Children:   Sons: 1  Daughters:  Relationships:  Education:  Merchant navy officer in Psychiatrist Problems/Performance: she did very well in elementary school high school college and graduate school Religious Beliefs/Practices: Christian History of Abuse: None Armed forces technical officer; 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 Legal History: None Hobbies/Interests: Unknown  Family History:   Family History  Problem Relation Age of Onset  . Alcohol abuse Father   . ADD / ADHD Other   . ADD / ADHD Other     Mental Status Examination/Evaluation: Objective:  Appearance: Neat and Well Groomed  Eye Contact::  Good  Speech:  Normal Rate  Volume:  Normal  Mood fairly good   Affect: Congruent   Thought Process:  Negative  Orientation:  Full (Time, Place, and Person)  Thought Content:  Negative  Suicidal Thoughts:  No  Homicidal Thoughts:  No  Judgement:  Good  Insight:  Good  Psychomotor Activity:  Normal  Akathisia:  No  Handed:  Right  AIMS (if indicated):    Assets:  Communication Skills Desire for Improvement Social Support Vocational/Educational    Laboratory/X-Ray Psychological Evaluation(s)        Assessment:  Axis I: ADHD, inattentive type  AXIS I ADHD, inattentive type  AXIS II Deferred  AXIS III Past Medical History  Diagnosis Date   . ADHD (attention deficit hyperactivity disorder)   . Hot flashes, menopausal   . Gastric reflux      AXIS IV other psychosocial or environmental problems  AXIS V 51-60 moderate symptoms   Treatment Plan/Recommendations:  Plan of Care: Medication management   Laboratory:    Psychotherapy: She'll begin the see Florencia Reasons here   Medications:. She will stop Adderall and start Vyvanse 50 mg every morning for ADHD and  Celexa 40 mg daily for depression and  trazodone to 100 mg daily at bedtime for sleep   Routine PRN Medications:  No  Consultations:   Safety Concerns: She denies thoughts of hurting self or others   Other:  She'll return in 4 weeks     Diannia Ruder, MD 10/31/20163:11 PM

## 2015-05-30 ENCOUNTER — Encounter (HOSPITAL_COMMUNITY): Payer: Self-pay | Admitting: Psychiatry

## 2015-05-30 ENCOUNTER — Ambulatory Visit (INDEPENDENT_AMBULATORY_CARE_PROVIDER_SITE_OTHER): Payer: BLUE CROSS/BLUE SHIELD | Admitting: Psychiatry

## 2015-05-30 VITALS — BP 136/82 | HR 96 | Ht 65.0 in | Wt 213.0 lb

## 2015-05-30 DIAGNOSIS — F900 Attention-deficit hyperactivity disorder, predominantly inattentive type: Secondary | ICD-10-CM

## 2015-05-30 DIAGNOSIS — F988 Other specified behavioral and emotional disorders with onset usually occurring in childhood and adolescence: Secondary | ICD-10-CM

## 2015-05-30 MED ORDER — LISDEXAMFETAMINE DIMESYLATE 50 MG PO CAPS: 50.0000 mg | ORAL_CAPSULE | Freq: Every day | ORAL | Status: DC

## 2015-05-30 MED ORDER — CITALOPRAM HYDROBROMIDE 40 MG PO TABS: 40.0000 mg | ORAL_TABLET | Freq: Every day | ORAL | Status: DC

## 2015-05-30 MED ORDER — TRAZODONE HCL 100 MG PO TABS: 100.0000 mg | ORAL_TABLET | Freq: Every day | ORAL | Status: DC

## 2015-05-30 NOTE — Progress Notes (Signed)
Patient ID: Laurie Reyes, female   DOB: 1960-10-16, 54 y.o.   MRN: [id number] Patient ID: Laurie Reyes, female   DOB: 18-May-1961, 54 y.o.   MRN: [id number] Patient ID: Laurie Reyes, female   DOB: 12/31/1960, 54 y.o.   MRN: [id number] Patient ID: Laurie Reyes, female   DOB: [birth year], 54 y.o.   MRN: [id number] Patient ID: Laurie Reyes, female   DOB: [birth year], 54 y.o.   MRN: [id number] Patient ID: Laurie Reyes, female   DOB: 1960-11-25, 54 y.o.   MRN: [id number] Patient ID: Laurie Reyes, female   DOB: 23-May-1961, 54 y.o.   MRN: [id number] Patient ID: Laurie Reyes, female   DOB: [birth year], 54 y.o.   MRN: [id number] Patient ID: Laurie Reyes, female   DOB: 10-20-1960, 54 y.o.   MRN: [id number] Patient ID: Laurie Reyes, female   DOB: Feb 06, 1961, 54 y.o.   MRN: [id number] Patient ID: Laurie Reyes, female   DOB: 09/12/1960, 54 y.o.   MRN: [id number] Patient ID: Laurie Reyes, female   DOB: 1960/09/14, 54 y.o.   MRN: [id number] Patient ID: Laurie Reyes, female   DOB: 07/01/1960, 54 y.o.   MRN: [id number]  Psychiatric Assessment Adult  Patient Identification:  Laurie Reyes Date of Evaluation:  05/30/2015 Chief Complaint: "I can't stay focused or complete tasks." History of Chief Complaint:   Chief Complaint  Patient presents with  . ADHD  . Depression  . Follow-up    Depression        Past medical history includes anxiety.   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   The patient is self-referred. I treated her great niece for ADHD and we have met through that connection.  The patient states that she's not significantly depressed or anxious at the present time. She was anxious for a while and her primary Dr. put her on  citalopram which has been helpful. She is going through menopause and has difficulty sleeping. She often has hot flashes and night sweats. Her gynecologist put her on progesterone but she has stopped it because she is concerned about side effects. The trazodone helps to some degree.  Recently  she's unable to complete her work shift. She's scattered and can't complete tasks. She'll start one thing and go to another. She's very unfocused at home and work. She denies been critically worried about anything. Her 39 year old son as a criminal history but she feels like she's done everything she can do to help him. Her marriage is going well. Her niece does have ADHD and has difficulties in school but we are trying to manage this with her here. She denies anything like suicidal ideation or psychotic symptoms but simply can't stay on task.  The patient returns after  4 weeks. Last time she was struggling to stay focused I switched her from Adderall to Vyvanse 50 mg daily. She is doing much better at work and is able to stay on tasks throughout the whole day. Her niece is coming back and probably will stay here to live and she is very excited about this. Her mood is good and she is sleeping well Review of Systems  Constitutional: Positive for diaphoresis.  Psychiatric/Behavioral: Positive for depression.   Physical Exam not done  Depressive Symptoms: insomnia, disturbed sleep,  (Hypo) Manic Symptoms:   Elevated Mood:  No Irritable Mood:  No Grandiosity:  No Distractibility:  Yes Labiality of Mood:  No Delusions:  No Hallucinations:  No Impulsivity:  No Sexually Inappropriate Behavior:  No Financial Extravagance:  No Flight of Ideas:  No  Anxiety Symptoms: Excessive Worry:  No Panic Symptoms:  No Agoraphobia:  No Obsessive Compulsive: No  Symptoms: None, Specific Phobias:  No Social Anxiety:  No  Psychotic Symptoms:  Hallucinations: No None Delusions:  No Paranoia:  No   Ideas of  Reference:  No  PTSD Symptoms: Ever had a traumatic exposure:  No Had a traumatic exposure in the last month:  No Re-experiencing: No None Hypervigilance:  No Hyperarousal: No None Avoidance: No None  Traumatic Brain Injury: No  Past Psychiatric History: Diagnosis: Mild anxiety   Hospitalizations: None   Outpatient Care: None   Substance Abuse Care: None   Self-Mutilation: None   Suicidal Attempts: None   Violent Behaviors: None    Past Medical History:   Past Medical History  Diagnosis Date  . ADHD (attention deficit hyperactivity disorder)   . Hot flashes, menopausal   . Gastric reflux    History of Loss of Consciousness:  No Seizure History:  No Cardiac History:  No Allergies:   Allergies  Allergen Reactions  . Penicillins   . Shellfish Allergy   . Sulfa Antibiotics    Current Medications:  Current Outpatient Prescriptions  Medication Sig Dispense Refill  . citalopram (CELEXA) 40 MG tablet Take 1 tablet (40 mg total) by mouth daily. 30 tablet 2  . EPINEPHrine (EPI-PEN) 0.3 mg/0.3 mL SOAJ injection     . ketoconazole (NIZORAL) 2 % cream     . lisdexamfetamine (VYVANSE) 50 MG capsule Take 1 capsule (50 mg total) by mouth daily. 30 capsule 0  . pantoprazole (PROTONIX) 40 MG tablet Take 40 mg by mouth 2 (two) times daily.     . traZODone (DESYREL) 100 MG tablet Take 1 tablet (100 mg total) by mouth at bedtime. 30 tablet 2  . lisdexamfetamine (VYVANSE) 50 MG capsule Take 1 capsule (50 mg total) by mouth daily. 30 capsule 0  . lisdexamfetamine (VYVANSE) 50 MG capsule Take 1 capsule (50 mg total) by mouth daily. 30 capsule 0   No current facility-administered medications for this visit.    Previous Psychotropic Medications:  Medication Dose   Celexa   40 mg every morning   Trazodone   50 mg each bedtime                   Substance Abuse History in the last 12 months: Substance Age of 1st Use Last Use Amount Specific Type  Nicotine      Alcohol       Cannabis      Opiates      Cocaine      Methamphetamines      LSD      Ecstasy      Benzodiazepines      Caffeine      Inhalants      Others:                          Medical Consequences of Substance Abuse: n/a  Legal Consequences of Substance Abuse:n/a  Family Consequences of Substance Abuse: n/a  Blackouts:  No DT's:  No Withdrawal Symptoms:  No None  Social History: Current Place of Residence: [place] [address] of Birth: [place] Family Members: Husband, daughter niece and nephew Marital Status:  Married Children:  Sons: 1  Daughters:  Relationships:  Education:  Merchant navy officer in Psychiatrist Problems/Performance: she did very well in elementary school high school college and graduate school Religious Beliefs/Practices: Christian History of Abuse: None Armed forces technical officer; 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 Legal History: None Hobbies/Interests: Unknown  Family History:   Family History  Problem Relation Age of Onset  . Alcohol abuse Father   . ADD / ADHD Other   . ADD / ADHD Other     Mental Status Examination/Evaluation: Objective:  Appearance: Neat and Well Groomed  Eye Contact::  Good  Speech:  Normal Rate  Volume:  Normal  Mood fgood   Affect: Congruent   Thought Process:  Negative  Orientation:  Full (Time, Place, and Person)  Thought Content:  Negative  Suicidal Thoughts:  No  Homicidal Thoughts:  No  Judgement:  Good  Insight:  Good  Psychomotor Activity:  Normal  Akathisia:  No  Handed:  Right  AIMS (if indicated):    Assets:  Communication Skills Desire for Improvement Social Support Vocational/Educational    Laboratory/X-Ray Psychological Evaluation(s)        Assessment:  Axis I: ADHD, inattentive type  AXIS I ADHD, inattentive type  AXIS II Deferred  AXIS III Past Medical History  Diagnosis Date  . ADHD (attention deficit  hyperactivity disorder)   . Hot flashes, menopausal   . Gastric reflux      AXIS IV other psychosocial or environmental problems  AXIS V 51-60 moderate symptoms   Treatment Plan/Recommendations:  Plan of Care: Medication management   Laboratory:    Psychotherapy: She'll begin the see Florencia Reasons here   Medications:. She will into new Vyvanse 50 mg every morning for ADHD and  Celexa 40 mg daily for depression and  trazodone to 100 mg daily at bedtime for sleep   Routine PRN Medications:  No  Consultations:   Safety Concerns: She denies thoughts of hurting self or others   Other:  She'll return in 3 months     Diannia Ruder, MD 12/9/20163:23 PM

## 2015-06-27 ENCOUNTER — Telehealth (HOSPITAL_COMMUNITY): Payer: Self-pay | Admitting: *Deleted

## 2015-08-21 ENCOUNTER — Ambulatory Visit (HOSPITAL_COMMUNITY): Payer: Self-pay | Admitting: Psychiatry

## 2015-08-22 ENCOUNTER — Ambulatory Visit (HOSPITAL_COMMUNITY): Payer: Self-pay | Admitting: Psychiatry

## 2015-08-27 ENCOUNTER — Other Ambulatory Visit (HOSPITAL_COMMUNITY): Payer: Self-pay | Admitting: Internal Medicine

## 2015-08-27 DIAGNOSIS — Z1231 Encounter for screening mammogram for malignant neoplasm of breast: Secondary | ICD-10-CM

## 2015-08-29 ENCOUNTER — Ambulatory Visit (INDEPENDENT_AMBULATORY_CARE_PROVIDER_SITE_OTHER): Payer: BLUE CROSS/BLUE SHIELD | Admitting: Psychiatry

## 2015-08-29 ENCOUNTER — Encounter (HOSPITAL_COMMUNITY): Payer: Self-pay | Admitting: Psychiatry

## 2015-08-29 VITALS — BP 134/77 | HR 72 | Ht 65.0 in | Wt 207.8 lb

## 2015-08-29 DIAGNOSIS — F900 Attention-deficit hyperactivity disorder, predominantly inattentive type: Secondary | ICD-10-CM

## 2015-08-29 DIAGNOSIS — F988 Other specified behavioral and emotional disorders with onset usually occurring in childhood and adolescence: Secondary | ICD-10-CM

## 2015-08-29 MED ORDER — LISDEXAMFETAMINE DIMESYLATE 60 MG PO CAPS: 60.0000 mg | ORAL_CAPSULE | ORAL | Status: DC

## 2015-08-29 MED ORDER — TRAZODONE HCL 100 MG PO TABS: 100.0000 mg | ORAL_TABLET | Freq: Every day | ORAL | Status: DC

## 2015-08-29 MED ORDER — CITALOPRAM HYDROBROMIDE 40 MG PO TABS: 40.0000 mg | ORAL_TABLET | Freq: Every day | ORAL | Status: DC

## 2015-08-29 NOTE — Progress Notes (Signed)
Patient ID: Samyiah Reindl, female   DOB: 08/31/1960, 54 y.o.   MRN: [id number] Patient ID: Jordana Caruthers, female   DOB: 01/07/1961, 54 y.o.   MRN: [id number] Patient ID: Alvina Grist, female   DOB: 04/24/1961, 54 y.o.   MRN: [id number] Patient ID: Neveen Subramanian, female   DOB: 10/14/1960, 54 y.o.   MRN: [id number] Patient ID: Hoang Melle, female   DOB: 01/13/1961, 54 y.o.   MRN: [id number] Patient ID: Dezhane Huesman, female   DOB: 05/31/1961, 54 y.o.   MRN: [id number] Patient ID: Marco Poynor, female   DOB: 01/07/1961, 54 y.o.   MRN: [id number] Patient ID: Danayah Saine, female   DOB: 11/30/1960, 54 y.o.   MRN: [id number] Patient ID: Nare Lavery, female   DOB: 09/09/1960, 54 y.o.   MRN: [id number] Patient ID: Lyncoln Douville, female   DOB: 08/24/1960, 54 y.o.   MRN: [id number] Patient ID: Brendan Pickelsimer, female   DOB: 04/27/1961, 54 y.o.   MRN: [id number] Patient ID: Bernestine Mcenaney, female   DOB: 10/29/1960, 54 y.o.   MRN: [id number] Patient ID: Spring Johanson, female   DOB: 11/28/1960, 54 y.o.   MRN: [id number] Patient ID: Fiora Netherton, female   DOB: 11/10/1960, 54 y.o.   MRN: [id number]  Psychiatric Assessment Adult  Patient Identification:  Caasi Sweney Date of Evaluation:  08/29/2015 Chief Complaint: "I can't stay focused or complete tasks." History of Chief Complaint:   Chief Complaint  Patient presents with  . Depression  . ADD  . Follow-up    Depression        Past medical history includes anxiety.   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   The patient is self-referred. I treated her great niece for ADHD and we have met through that connection.  The patient states that she's not significantly depressed or anxious at  the present time. She was anxious for a while and her primary Dr. put her on citalopram which has been helpful. She is going through menopause and has difficulty sleeping. She often has hot flashes and night sweats. Her gynecologist put her on progesterone but she has stopped it because she is concerned about side effects. The trazodone helps to some degree.  Recently  she's unable to complete her work shift. She's scattered and can't complete tasks. She'll start one thing and go to another. She's very unfocused at home and work. She denies been critically worried about anything. Her 30-year-old son as a criminal history but she feels like she's done everything she can do to help him. Her marriage is going well. Her niece does have ADHD and has difficulties in school but we are trying to manage this with her here. She denies anything like suicidal ideation or psychotic symptoms but simply can't stay on task.  The patient returns after 3 months. Her 11-year-old grand niece has moved back with her this year. She's doing a lot to try to keep her busy and occupied. Overall her mood is been pretty good and she sleeping well. Lately however she's not been able to focus well and would like a slight increase in her Vyvanse. She is somewhat overwhelmed with having her great niece back with her again Review of Systems  Constitutional: Positive for diaphoresis.  Psychiatric/Behavioral: Positive for depression.   Physical Exam not done  Depressive Symptoms: insomnia, disturbed sleep,  (  Patient ID: Bristyn Kulesza, female   DOB: 1960-10-05, 55 y.o.   MRN: [id number] Patient ID: Willene Holian, female   DOB: 07-Dec-1960, 55 y.o.   MRN: [id number] Patient ID: Ariyon Gerstenberger, female   DOB: [birth year], 55 y.o.   MRN: [id number] Patient ID: Calisa Luckenbaugh, female   DOB: 05/14/1961, 55 y.o.   MRN: [id number] Patient ID: Shakti Fleer, female   DOB: 1960/12/17, 55 y.o.   MRN: [id number] Patient ID: Lonna Rabold, female   DOB: [birth year], 55 y.o.   MRN: [id number] Patient ID: Alexarae Oliva, female   DOB: Jun 06, 1961, 55 y.o.   MRN: [id number] Patient ID: Madalaine Portier, female   DOB: 1960/09/23, 55 y.o.   MRN: [id number] Patient ID: Dalylah Ramey, female   DOB: 1960-10-18, 55 y.o.   MRN: [id number] Patient ID: Beila Purdie, female   DOB: Aug 22, 1960, 55 y.o.   MRN: [id number] Patient ID: Akiera Allbaugh, female   DOB: 07/04/1960, 55 y.o.   MRN: [id number] Patient ID: Ahja Martello, female   DOB: [birth year], 55 y.o.   MRN: [id number] Patient ID: Angelene Rome, female   DOB: 1961-06-18, 55 y.o.   MRN: [id number] Patient ID: Xara Paulding, female   DOB: [birth year], 55 y.o.   MRN: [id number]  Psychiatric Assessment Adult  Patient Identification:  Rashidah [place] Date of Evaluation:  08/29/2015 Chief Complaint: "I can't stay focused or complete tasks." History of Chief Complaint:   Chief Complaint  Patient presents with  . Depression  . ADD  . Follow-up    Depression        Past medical history includes anxiety.   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   The patient is self-referred. I treated her great niece for ADHD and we have met through that connection.  The patient states that she's not significantly depressed or anxious at  the present time. She was anxious for a while and her primary Dr. put her on citalopram which has been helpful. She is going through menopause and has difficulty sleeping. She often has hot flashes and night sweats. Her gynecologist put her on progesterone but she has stopped it because she is concerned about side effects. The trazodone helps to some degree.  Recently  she's unable to complete her work shift. She's scattered and can't complete tasks. She'll start one thing and go to another. She's very unfocused at home and work. She denies been critically worried about anything. Her 46 year old son as a criminal history but she feels like she's done everything she can do to help him. Her marriage is going well. Her niece does have ADHD and has difficulties in school but we are trying to manage this with her here. She denies anything like suicidal ideation or psychotic symptoms but simply can't stay on task.  The patient returns after 3 months. Her 46 year old grand niece has moved back with her this year. She's doing a lot to try to keep her busy and occupied. Overall her mood is been pretty good and she sleeping well. Lately however she's not been able to focus well and would like a slight increase in her Vyvanse. She is somewhat overwhelmed with having her great niece back with her again Review of Systems  Constitutional: Positive for diaphoresis.  Psychiatric/Behavioral: Positive for depression.   Physical Exam not done  Depressive Symptoms: insomnia, disturbed sleep,  (  Patient ID: Samyiah Reindl, female   DOB: 08/31/1960, 54 y.o.   MRN: [id number] Patient ID: Jordana Caruthers, female   DOB: 01/07/1961, 54 y.o.   MRN: [id number] Patient ID: Alvina Grist, female   DOB: 04/24/1961, 54 y.o.   MRN: [id number] Patient ID: Neveen Subramanian, female   DOB: 10/14/1960, 54 y.o.   MRN: [id number] Patient ID: Hoang Melle, female   DOB: 01/13/1961, 54 y.o.   MRN: [id number] Patient ID: Dezhane Huesman, female   DOB: 05/31/1961, 54 y.o.   MRN: [id number] Patient ID: Marco Poynor, female   DOB: 01/07/1961, 54 y.o.   MRN: [id number] Patient ID: Danayah Saine, female   DOB: 11/30/1960, 54 y.o.   MRN: [id number] Patient ID: Nare Lavery, female   DOB: 09/09/1960, 54 y.o.   MRN: [id number] Patient ID: Lyncoln Douville, female   DOB: 08/24/1960, 54 y.o.   MRN: [id number] Patient ID: Brendan Pickelsimer, female   DOB: 04/27/1961, 54 y.o.   MRN: [id number] Patient ID: Bernestine Mcenaney, female   DOB: 10/29/1960, 54 y.o.   MRN: [id number] Patient ID: Spring Johanson, female   DOB: 11/28/1960, 54 y.o.   MRN: [id number] Patient ID: Fiora Netherton, female   DOB: 11/10/1960, 54 y.o.   MRN: [id number]  Psychiatric Assessment Adult  Patient Identification:  Caasi Sweney Date of Evaluation:  08/29/2015 Chief Complaint: "I can't stay focused or complete tasks." History of Chief Complaint:   Chief Complaint  Patient presents with  . Depression  . ADD  . Follow-up    Depression        Past medical history includes anxiety.   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   The patient is self-referred. I treated her great niece for ADHD and we have met through that connection.  The patient states that she's not significantly depressed or anxious at  the present time. She was anxious for a while and her primary Dr. put her on citalopram which has been helpful. She is going through menopause and has difficulty sleeping. She often has hot flashes and night sweats. Her gynecologist put her on progesterone but she has stopped it because she is concerned about side effects. The trazodone helps to some degree.  Recently  she's unable to complete her work shift. She's scattered and can't complete tasks. She'll start one thing and go to another. She's very unfocused at home and work. She denies been critically worried about anything. Her 30-year-old son as a criminal history but she feels like she's done everything she can do to help him. Her marriage is going well. Her niece does have ADHD and has difficulties in school but we are trying to manage this with her here. She denies anything like suicidal ideation or psychotic symptoms but simply can't stay on task.  The patient returns after 3 months. Her 11-year-old grand niece has moved back with her this year. She's doing a lot to try to keep her busy and occupied. Overall her mood is been pretty good and she sleeping well. Lately however she's not been able to focus well and would like a slight increase in her Vyvanse. She is somewhat overwhelmed with having her great niece back with her again Review of Systems  Constitutional: Positive for diaphoresis.  Psychiatric/Behavioral: Positive for depression.   Physical Exam not done  Depressive Symptoms: insomnia, disturbed sleep,  (

## 2015-09-03 ENCOUNTER — Other Ambulatory Visit (HOSPITAL_COMMUNITY): Payer: Self-pay | Admitting: Internal Medicine

## 2015-09-03 DIAGNOSIS — R1084 Generalized abdominal pain: Secondary | ICD-10-CM

## 2015-09-05 ENCOUNTER — Ambulatory Visit (HOSPITAL_COMMUNITY)
Admission: RE | Admit: 2015-09-05 | Discharge: 2015-09-05 | Disposition: A | Discharge status: Home / Self Care | Payer: BLUE CROSS/BLUE SHIELD | Source: Ambulatory Visit | Attending: Internal Medicine | Admitting: Internal Medicine

## 2015-09-05 DIAGNOSIS — K760 Fatty (change of) liver, not elsewhere classified: Secondary | ICD-10-CM | POA: Insufficient documentation

## 2015-09-05 DIAGNOSIS — R1084 Generalized abdominal pain: Secondary | ICD-10-CM | POA: Insufficient documentation

## 2015-09-19 ENCOUNTER — Ambulatory Visit (HOSPITAL_COMMUNITY)
Admission: RE | Admit: 2015-09-19 | Discharge: 2015-09-19 | Disposition: A | Discharge status: Home / Self Care | Payer: BLUE CROSS/BLUE SHIELD | Source: Ambulatory Visit | Attending: Internal Medicine | Admitting: Internal Medicine

## 2015-09-19 DIAGNOSIS — Z1231 Encounter for screening mammogram for malignant neoplasm of breast: Secondary | ICD-10-CM | POA: Insufficient documentation

## 2015-11-28 ENCOUNTER — Ambulatory Visit (INDEPENDENT_AMBULATORY_CARE_PROVIDER_SITE_OTHER): Payer: BLUE CROSS/BLUE SHIELD | Admitting: Psychiatry

## 2015-11-28 ENCOUNTER — Encounter (HOSPITAL_COMMUNITY): Payer: Self-pay | Admitting: Psychiatry

## 2015-11-28 VITALS — BP 105/64 | HR 80 | Ht 65.0 in | Wt 205.6 lb

## 2015-11-28 DIAGNOSIS — F909 Attention-deficit hyperactivity disorder, unspecified type: Secondary | ICD-10-CM

## 2015-11-28 DIAGNOSIS — F988 Other specified behavioral and emotional disorders with onset usually occurring in childhood and adolescence: Secondary | ICD-10-CM

## 2015-11-28 MED ORDER — LISDEXAMFETAMINE DIMESYLATE 60 MG PO CAPS: 60.0000 mg | ORAL_CAPSULE | ORAL | Status: DC

## 2015-11-28 MED ORDER — CITALOPRAM HYDROBROMIDE 40 MG PO TABS: 40.0000 mg | ORAL_TABLET | Freq: Every day | ORAL | Status: DC

## 2015-11-28 MED ORDER — TRAZODONE HCL 100 MG PO TABS: 100.0000 mg | ORAL_TABLET | Freq: Every day | ORAL | Status: DC

## 2015-11-28 NOTE — Progress Notes (Signed)
Patient ID: Sifa Troeger, female   DOB: 10-06-1960, 55 y.o.   MRN: [id number] Patient ID: Kenzlie Cera, female   DOB: 07/27/1960, 55 y.o.   MRN: [id number] Patient ID: Amarya Staudinger, female   DOB: 01/02/1961, 55 y.o.   MRN: [id number] Patient ID: Janean Pedraza, female   DOB: January 04, 1961, 55 y.o.   MRN: [id number] Patient ID: Kiwanna Bouska, female   DOB: October 15, 1960, 55 y.o.   MRN: [id number] Patient ID: Ammi Pavlik, female   DOB: [birth year], 55 y.o.   MRN: [id number] Patient ID: Aleatha Graper, female   DOB: Feb 23, 1961, 55 y.o.   MRN: [id number] Patient ID: Iline Mouser, female   DOB: 04/17/1961, 55 y.o.   MRN: [id number] Patient ID: Shantika Accardo, female   DOB: 18-Nov-1960, 54 y.o.   MRN: [id number] Patient ID: Manaswini Fatzinger, female   DOB: 22-Apr-1961, 55 y.o.   MRN: [id number] Patient ID: Makhila Paladino, female   DOB: Dec 21, 1960, 55 y.o.   MRN: [id number] Patient ID: Brilyn Phon, female   DOB: [birth year], 55 y.o.   MRN: [id number] Patient ID: Eriah Cham, female   DOB: [birth year], 55 y.o.   MRN: [id number] Patient ID: Keiandra Hulton, female   DOB: Aug 17, 1960, 55 y.o.   MRN: [id number] Patient ID: Neily Gabler, female   DOB: 04-02-1961, 55 y.o.   MRN: [id number]  Psychiatric Assessment Adult  Patient Identification:  Donnajean Tapp Date of Evaluation:  11/28/2015 Chief Complaint: "I can't stay focused or complete tasks." History of Chief Complaint:   Chief Complaint  Patient presents with  . Depression  . ADHD  . Follow-up    Depression        Past medical history includes anxiety.   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   The patient is self-referred. I treated her great niece for ADHD and we have met through that  connection.  The patient states that she's not significantly depressed or anxious at the present time. She was anxious for a while and her primary Dr. put her on citalopram which has been helpful. She is going through menopause and has difficulty sleeping. She often has hot flashes and night sweats. Her gynecologist put her on progesterone but she has stopped it because she is concerned about side effects. The trazodone helps to some degree.  Recently  she's unable to complete her work shift. She's scattered and can't complete tasks. She'll start one thing and go to another. She's very unfocused at home and work. She denies been critically worried about anything. Her 55 year old son as a criminal history but she feels like she's done everything she can do to help him. Her marriage is going well. Her niece does have ADHD and has difficulties in school but we are trying to manage this with her here. She denies anything like suicidal ideation or psychotic symptoms but simply can't stay on task.  The patient returns after 3 months. Her 55 year old grand niece has moved back with her this year. She's doing a lot to try to keep her busy and occupied. Overall her mood is been pretty good and she sleeping well. Last time we increased her Vyvanse a bit and she is staying well focused. She does not have any specific complaints Review of Systems  Constitutional: Positive for diaphoresis.  Psychiatric/Behavioral: Positive for depression.   Physical Exam not done  Depressive Symptoms: insomnia, disturbed sleep,  (Hypo) Manic Symptoms:   Elevated Mood:  No Irritable Mood:  No Grandiosity:  No Distractibility:  Yes Labiality of Mood:  No Delusions:  No Hallucinations:  No Impulsivity:  No Sexually Inappropriate Behavior:  No Financial Extravagance:  No Flight of Ideas:  No  Anxiety Symptoms: Excessive Worry:  No Panic Symptoms:  No Agoraphobia:  No Obsessive Compulsive: No  Symptoms:  None, Specific Phobias:  No Social Anxiety:  No  Psychotic Symptoms:  Hallucinations: No None Delusions:  No Paranoia:  No   Ideas of Reference:  No  PTSD Symptoms: Ever had a traumatic exposure:  No Had a traumatic exposure in the last month:  No Re-experiencing: No None Hypervigilance:  No Hyperarousal: No None Avoidance: No None  Traumatic Brain Injury: No  Past Psychiatric History: Diagnosis: Mild anxiety   Hospitalizations: None   Outpatient Care: None   Substance Abuse Care: None   Self-Mutilation: None   Suicidal Attempts: None   Violent Behaviors: None    Past Medical History:   Past Medical History  Diagnosis Date  . ADHD (attention deficit hyperactivity disorder)   . Hot flashes, menopausal   . Gastric reflux    History of Loss of Consciousness:  No Seizure History:  No Cardiac History:  No Allergies:   Allergies  Allergen Reactions  . Penicillins   . Shellfish Allergy   . Sulfa Antibiotics    Current Medications:  Current Outpatient Prescriptions  Medication Sig Dispense Refill  . citalopram (CELEXA) 40 MG tablet Take 1 tablet (40 mg total) by mouth daily. 30 tablet 2  . EPINEPHrine (EPI-PEN) 0.3 mg/0.3 mL SOAJ injection     . ketoconazole (NIZORAL) 2 % cream     . lisdexamfetamine (VYVANSE) 60 MG capsule Take 1 capsule (60 mg total) by mouth every morning. 30 capsule 0  . pantoprazole (PROTONIX) 40 MG tablet Take 40 mg by mouth 2 (two) times daily.     . traZODone (DESYREL) 100 MG tablet Take 1 tablet (100 mg total) by mouth at bedtime. 30 tablet 2  . lisdexamfetamine (VYVANSE) 60 MG capsule Take 1 capsule (60 mg total) by mouth every morning. 30 capsule 0  . lisdexamfetamine (VYVANSE) 60 MG capsule Take 1 capsule (60 mg total) by mouth every morning. 30 capsule 0   No current facility-administered medications for this visit.    Previous Psychotropic Medications:  Medication Dose   Celexa   40 mg every morning   Trazodone   50 mg each  bedtime                   Substance Abuse History in the last 12 months: Substance Age of 1st Use Last Use Amount Specific Type  Nicotine      Alcohol      Cannabis      Opiates      Cocaine      Methamphetamines      LSD      Ecstasy      Benzodiazepines      Caffeine      Inhalants      Others:                          Medical Consequences of Substance Abuse: n/a  Legal Consequences of Substance Abuse:n/a  Family Consequences of Substance Abuse: n/a  Blackouts:  No DT's:  No Withdrawal Symptoms:  No None  Social History: Current  Place of Residence: [facility] of Birth: [place] Family Members: Husband, daughter niece and nephew Marital Status:  Married Children:   Sons: 1  Daughters:  Relationships:  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 Legal History: None Hobbies/Interests: Unknown  Family History:   Family History  Problem Relation Age of Onset  . Alcohol abuse Father   . ADD / ADHD Other   . ADD / ADHD Other     Mental Status Examination/Evaluation: Objective:  Appearance: Neat and Well Groomed  Eye Contact::  Good  Speech:  Normal Rate  Volume:  Normal  Mood good   Affect: Congruent   Thought Process:  Negative  Orientation:  Full (Time, Place, and Person)  Thought Content:  Negative  Suicidal Thoughts:  No  Homicidal Thoughts:  No  Judgement:  Good  Insight:  Good  Psychomotor Activity:  Normal  Akathisia:  No  Handed:  Right  AIMS (if indicated):    Assets:  Communication Skills Desire for Improvement Social Support Vocational/Educational    Laboratory/X-Ray Psychological Evaluation(s)        Assessment:  Axis  I: ADHD, inattentive type  AXIS I ADHD, inattentive type  AXIS II Deferred  AXIS III Past Medical History  Diagnosis Date  . ADHD (attention deficit hyperactivity disorder)   . Hot flashes, menopausal   . Gastric reflux      AXIS IV other psychosocial or environmental problems  AXIS V 51-60 moderate symptoms   Treatment Plan/Recommendations:  Plan of Care: Medication management   Laboratory:    Psychotherapy: She'll begin the see Florencia Reasons here   Medications:. She will continue Vyvanse  60 mg every morning for ADHD and  Celexa 40 mg daily for depression and  trazodone to 100 mg daily at bedtime for sleep   Routine PRN Medications:  No  Consultations:   Safety Concerns: She denies thoughts of hurting self or others   Other:  She'll return in 3 months     Diannia Ruder, MD 6/9/201711:20 AM

## 2015-12-22 ENCOUNTER — Telehealth (HOSPITAL_COMMUNITY): Payer: Self-pay | Admitting: *Deleted

## 2015-12-22 NOTE — Telephone Encounter (Signed)
Pt called stating she lost the sheet of her Vyvanse since Dr. Harrington Challenger printed it for her on 11-28-15. Per pt, she looked everywhere for the 3 refills that Dr. Harrington Challenger printed. Per pt, she's not sure if her grandchildren through them out or if she did but she looked everywhere and has not been on it. Pt number is [phone] or work number is [phone].

## 2015-12-24 ENCOUNTER — Other Ambulatory Visit (HOSPITAL_COMMUNITY): Payer: Self-pay | Admitting: Psychiatry

## 2015-12-24 MED ORDER — LISDEXAMFETAMINE DIMESYLATE 60 MG PO CAPS: 60.0000 mg | ORAL_CAPSULE | ORAL | Status: DC

## 2015-12-24 NOTE — Telephone Encounter (Signed)
printed

## 2015-12-24 NOTE — Telephone Encounter (Signed)
Called pt home number and lmtcb and number provided. Called pt work number that was provided and was unable to reach pt. Office did not leave message.

## 2016-01-06 NOTE — Telephone Encounter (Signed)
Per pt, her husband found her previous script that Dr. Harrington Challenger give to her and she no longer need this current script. Prescription will be shredded.

## 2016-02-27 ENCOUNTER — Ambulatory Visit (INDEPENDENT_AMBULATORY_CARE_PROVIDER_SITE_OTHER): Payer: BLUE CROSS/BLUE SHIELD | Admitting: Psychiatry

## 2016-02-27 ENCOUNTER — Encounter (HOSPITAL_COMMUNITY): Payer: Self-pay | Admitting: Psychiatry

## 2016-02-27 VITALS — BP 128/89 | HR 72 | Ht 65.0 in | Wt 204.4 lb

## 2016-02-27 DIAGNOSIS — F900 Attention-deficit hyperactivity disorder, predominantly inattentive type: Secondary | ICD-10-CM

## 2016-02-27 DIAGNOSIS — F988 Other specified behavioral and emotional disorders with onset usually occurring in childhood and adolescence: Secondary | ICD-10-CM

## 2016-02-27 MED ORDER — CITALOPRAM HYDROBROMIDE 40 MG PO TABS: 40.0000 mg | ORAL_TABLET | Freq: Every day | ORAL | 2 refills | Status: DC

## 2016-02-27 MED ORDER — LISDEXAMFETAMINE DIMESYLATE 70 MG PO CAPS: 70.0000 mg | ORAL_CAPSULE | Freq: Every day | ORAL | 0 refills | Status: DC

## 2016-02-27 MED ORDER — TRAZODONE HCL 100 MG PO TABS: 100.0000 mg | ORAL_TABLET | Freq: Every day | ORAL | 2 refills | Status: DC

## 2016-02-27 NOTE — Progress Notes (Signed)
Patient ID: Laurie Reyes, female   DOB: 12/25/1960, 55 y.o.   MRN: [id number] Patient ID: Laurie Reyes, female   DOB: August 31, 1960, 55 y.o.   MRN: [id number] Patient ID: Laurie Reyes, female   DOB: 12/16/1960, 55 y.o.   MRN: [id number] Patient ID: Laurie Reyes, female   DOB: Sep 10, 1960, 55 y.o.   MRN: [id number] Patient ID: Laurie Reyes, female   DOB: [birth year], 55 y.o.   MRN: [id number] Patient ID: Laurie Reyes, female   DOB: 06-29-1960, 55 y.o.   MRN: [id number] Patient ID: Laurie Reyes, female   DOB: April 30, 1961, 55 y.o.   MRN: [id number] Patient ID: Laurie Reyes, female   DOB: March 23, 1961, 55 y.o.   MRN: [id number] Patient ID: Laurie Reyes, female   DOB: [birth year], 55 y.o.   MRN: [id number] Patient ID: Laurie Reyes, female   DOB: Sep 18, 1960, 55 y.o.   MRN: [id number] Patient ID: Laurie Reyes, female   DOB: [birth year], 55 y.o.   MRN: [id number] Patient ID: Laurie Reyes, female   DOB: 03/31/1961, 55 y.o.   MRN: [id number] Patient ID: Laurie Reyes, female   DOB: April 12, 1961, 55 y.o.   MRN: [id number] Patient ID: Laurie Reyes, female   DOB: 01-03-1961, 55 y.o.   MRN: [id number] Patient ID: Laurie Reyes, female   DOB: [birth year], 55 y.o.   MRN: [id number]  Psychiatric Assessment Adult  Patient Identification:  Laurie Reyes Date of Evaluation:  02/27/2016 Chief Complaint: "I can't stay focused or complete tasks." History of Chief Complaint:   Chief Complaint  Patient presents with  . ADD  . Anxiety  . Depression  . Follow-up    Depression         Past medical history includes anxiety.   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   The patient is self-referred. I treated her great niece for ADHD and we have met  through that connection.  The patient states that she's not significantly depressed or anxious at the present time. She was anxious for a while and her primary Dr. put her on citalopram which has been helpful. She is going through menopause and has difficulty sleeping. She often has hot flashes and night sweats. Her gynecologist put her on progesterone but she has stopped it because she is concerned about side effects. The trazodone helps to some degree.  Recently  she's unable to complete her work shift. She's scattered and can't complete tasks. She'll start one thing and go to another. She's very unfocused at home and work. She denies been critically worried about anything. Her 2 year old son as a criminal history but she feels like she's done everything she can do to help him. Her marriage is going well. Her niece does have ADHD and has difficulties in school but we are trying to manage this with her here. She denies anything like suicidal ideation or psychotic symptoms but simply can't stay on task.  The patient returns after 3 months. She is doing fairly well but finds that the Vyvanse wears off by mid afternoon. She is already tried Adderall and Concerta and has had a generally a better response with Vyvanse. We will try the 70 mg dose but if this does not work we may have to add Adderall at the end of the day for augmentation. Her mood has been good and she is sleeping well with  the trazodone Review of Systems  Constitutional: Positive for diaphoresis.  Psychiatric/Behavioral: Positive for depression.   Physical Exam not done  Depressive Symptoms: insomnia, disturbed sleep,  (Hypo) Manic Symptoms:   Elevated Mood:  No Irritable Mood:  No Grandiosity:  No Distractibility:  Yes Labiality of Mood:  No Delusions:  No Hallucinations:  No Impulsivity:  No Sexually Inappropriate Behavior:  No Financial Extravagance:  No Flight of Ideas:  No  Anxiety Symptoms: Excessive Worry:   No Panic Symptoms:  No Agoraphobia:  No Obsessive Compulsive: No  Symptoms: None, Specific Phobias:  No Social Anxiety:  No  Psychotic Symptoms:  Hallucinations: No None Delusions:  No Paranoia:  No   Ideas of Reference:  No  PTSD Symptoms: Ever had a traumatic exposure:  No Had a traumatic exposure in the last month:  No Re-experiencing: No None Hypervigilance:  No Hyperarousal: No None Avoidance: No None  Traumatic Brain Injury: No  Past Psychiatric History: Diagnosis: Mild anxiety   Hospitalizations: None   Outpatient Care: None   Substance Abuse Care: None   Self-Mutilation: None   Suicidal Attempts: None   Violent Behaviors: None    Past Medical History:   Past Medical History:  Diagnosis Date  . ADHD (attention deficit hyperactivity disorder)   . Gastric reflux   . Hot flashes, menopausal    History of Loss of Consciousness:  No Seizure History:  No Cardiac History:  No Allergies:   Allergies  Allergen Reactions  . Penicillins   . Shellfish Allergy   . Sulfa Antibiotics    Current Medications:  Current Outpatient Prescriptions  Medication Sig Dispense Refill  . citalopram (CELEXA) 40 MG tablet Take 1 tablet (40 mg total) by mouth daily. 30 tablet 2  . EPINEPHrine (EPI-PEN) 0.3 mg/0.3 mL SOAJ injection     . pantoprazole (PROTONIX) 40 MG tablet Take 40 mg by mouth 2 (two) times daily.     . traZODone (DESYREL) 100 MG tablet Take 1 tablet (100 mg total) by mouth at bedtime. 30 tablet 2  . lisdexamfetamine (VYVANSE) 70 MG capsule Take 1 capsule (70 mg total) by mouth daily. 30 capsule 0   No current facility-administered medications for this visit.     Previous Psychotropic Medications:  Medication Dose   Celexa   40 mg every morning   Trazodone   50 mg each bedtime                   Substance Abuse History in the last 12 months: Substance Age of 1st Use Last Use Amount Specific Type  Nicotine      Alcohol      Cannabis      Opiates       Cocaine      Methamphetamines      LSD      Ecstasy      Benzodiazepines      Caffeine      Inhalants      Others:                          Medical Consequences of Substance Abuse: n/a  Legal Consequences of Substance Abuse:n/a  Family Consequences of Substance Abuse: n/a  Blackouts:  No DT's:  No Withdrawal Symptoms:  No None  Social History: Current Place of Residence: [place] [address] of Birth: [place] Family Members: Husband, daughter niece and nephew Marital Status:  Married Children:   Sons: 1  Daughters:  Relationships:  Education:  Merchant navy officer in Psychiatrist Problems/Performance: she did very well in elementary school high school college and graduate school Religious Beliefs/Practices: Christian History of Abuse: None Occupational Experiences; 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 Legal History: None Hobbies/Interests: Unknown  Family History:   Family History  Problem Relation Age of Onset  . Alcohol abuse Father   . ADD / ADHD Other   . ADD / ADHD Other     Mental Status Examination/Evaluation: Objective:  Appearance: Neat and Well Groomed  Eye Contact::  Good  Speech:  Normal Rate  Volume:  Normal  Mood good   Affect: Congruent   Thought Process:  Negative  Orientation:  Full (Time, Place, and Person)  Thought Content:  Negative  Suicidal Thoughts:  No  Homicidal Thoughts:  No  Judgement:  Good  Insight:  Good  Psychomotor Activity:  Normal  Akathisia:  No  Handed:  Right  AIMS (if indicated):    Assets:  Communication Skills Desire for Improvement Social Support Vocational/Educational    Laboratory/X-Ray Psychological Evaluation(s)        Assessment:  Axis I: ADHD, inattentive type  AXIS I ADHD, inattentive type  AXIS II Deferred  AXIS III Past Medical History:  Diagnosis Date  . ADHD (attention deficit hyperactivity disorder)   . Gastric  reflux   . Hot flashes, menopausal      AXIS IV other psychosocial or environmental problems  AXIS V 51-60 moderate symptoms   Treatment Plan/Recommendations:  Plan of Care: Medication management   Laboratory:    Psychotherapy: She'll begin the see Florencia Reasons here   Medications:. She will continue Vyvanse  But increase the dose to 70 mg every morning for ADHD and  Celexa 40 mg daily for depression and  trazodone to 100 mg daily at bedtime for sleep   Routine PRN Medications:  No  Consultations:   Safety Concerns: She denies thoughts of hurting self or others   Other:  She'll return in 4 weeks     Diannia Ruder, MD 9/8/20179:53 AM     Patient ID: Brandt Loosen, female   DOB: [birth year], 55 y.o.   MRN: [id number]

## 2016-03-26 ENCOUNTER — Encounter (HOSPITAL_COMMUNITY): Payer: Self-pay

## 2016-03-26 ENCOUNTER — Ambulatory Visit (HOSPITAL_COMMUNITY): Payer: BLUE CROSS/BLUE SHIELD | Admitting: Psychiatry

## 2016-04-26 ENCOUNTER — Telehealth (HOSPITAL_COMMUNITY): Payer: Self-pay | Admitting: *Deleted

## 2016-04-26 NOTE — Telephone Encounter (Signed)
Pt Pt pharmacy requesting refills for pt Citalopram 40 mg QD. Pt medication last filled 02-27-2016 with 30 tabs 2 refills. Pt pharmacy number is [phone].

## 2016-04-26 NOTE — Telephone Encounter (Signed)
Pt pharmacy requesting refills for pt Trazodone 100 mg QHS. Pt medication last filled 02-27-2016 with 30 tabs 2 refills. Pt pharmacy number is [phone]

## 2016-04-27 NOTE — Telephone Encounter (Signed)
Has enough refills

## 2016-04-27 NOTE — Telephone Encounter (Signed)
noted 

## 2016-05-24 ENCOUNTER — Encounter (HOSPITAL_COMMUNITY): Payer: Self-pay | Admitting: *Deleted

## 2016-05-24 ENCOUNTER — Other Ambulatory Visit (HOSPITAL_COMMUNITY): Payer: Self-pay | Admitting: Psychiatry

## 2016-05-24 ENCOUNTER — Telehealth (HOSPITAL_COMMUNITY): Payer: Self-pay | Admitting: *Deleted

## 2016-05-24 MED ORDER — TRAZODONE HCL 100 MG PO TABS: 100.0000 mg | ORAL_TABLET | Freq: Every day | ORAL | 2 refills | Status: DC

## 2016-05-24 NOTE — Telephone Encounter (Signed)
Called pt work and lmtcb to sch appt. Called mobile and lmtcb to sch appt.

## 2016-05-24 NOTE — Telephone Encounter (Signed)
Pt pharmacy requesting refills for pt Trazodone 100 mg QHS. Pt last f/u was 02-27-16. Pt do not have f/u appt. Pt no showed for 03-26-2016 appt. Pt pharmacy number is [phone].

## 2016-05-24 NOTE — Telephone Encounter (Signed)
noted 

## 2016-05-24 NOTE — Telephone Encounter (Signed)
Sent. Call her for appt

## 2016-05-28 NOTE — Telephone Encounter (Signed)
lmtcb

## 2016-08-09 ENCOUNTER — Telehealth (HOSPITAL_COMMUNITY): Payer: Self-pay | Admitting: *Deleted

## 2016-08-09 ENCOUNTER — Other Ambulatory Visit (HOSPITAL_COMMUNITY): Payer: Self-pay | Admitting: Psychiatry

## 2016-08-09 MED ORDER — TRAZODONE HCL 100 MG PO TABS: 100.0000 mg | ORAL_TABLET | Freq: Every day | ORAL | 0 refills | Status: DC

## 2016-08-09 NOTE — Telephone Encounter (Signed)
Please contact the patient to see if she is still interested in coming to the clinic for follow up. Will prescribe one refill only, and no more given patient has no how since 02/2016.

## 2016-08-09 NOTE — Telephone Encounter (Signed)
Pt pharmacy requesting refills for pt Trazodone 100 mg QHS. Medication last filled on 05-24-2016 with 30 tabs 2 refills. Pt was last seen on 02-27-2016 with Dr. Harrington Challenger and no show for her 03-26-2016 appt. Pharmacy number is [phone].

## 2016-08-09 NOTE — Telephone Encounter (Signed)
noted 

## 2016-08-10 NOTE — Telephone Encounter (Signed)
Called pt and lmtcb

## 2016-08-11 NOTE — Telephone Encounter (Signed)
Called pt to see if she is still interested in coming back to see Dr. Harrington Challenger and to resch appt. Per pt she did not want to resch appt for herself at this time. Per pt, she stopped taking her medications that Dr. Harrington Challenger prescribed for her a while back. Pt did not verbalized any side effects or concerns with stopping her medications.

## 2016-08-16 NOTE — Telephone Encounter (Signed)
noted 

## 2016-10-29 ENCOUNTER — Other Ambulatory Visit (HOSPITAL_COMMUNITY): Payer: Self-pay | Admitting: Internal Medicine

## 2016-10-29 DIAGNOSIS — Z1231 Encounter for screening mammogram for malignant neoplasm of breast: Secondary | ICD-10-CM

## 2016-11-04 DIAGNOSIS — Z800 Family history of malignant neoplasm of digestive organs: Secondary | ICD-10-CM | POA: Diagnosis not present

## 2016-11-04 DIAGNOSIS — Z1211 Encounter for screening for malignant neoplasm of colon: Secondary | ICD-10-CM | POA: Diagnosis not present

## 2016-11-04 DIAGNOSIS — R1013 Epigastric pain: Secondary | ICD-10-CM | POA: Diagnosis not present

## 2016-11-05 ENCOUNTER — Ambulatory Visit (HOSPITAL_COMMUNITY)
Admission: RE | Admit: 2016-11-05 | Discharge: 2016-11-05 | Disposition: A | Discharge status: Home / Self Care | Payer: BLUE CROSS/BLUE SHIELD | Source: Ambulatory Visit | Attending: Internal Medicine | Admitting: Internal Medicine

## 2016-11-05 DIAGNOSIS — Z1231 Encounter for screening mammogram for malignant neoplasm of breast: Secondary | ICD-10-CM | POA: Diagnosis not present

## 2016-12-14 DIAGNOSIS — Z86010 Personal history of colonic polyps: Secondary | ICD-10-CM | POA: Diagnosis not present

## 2016-12-14 DIAGNOSIS — Z800 Family history of malignant neoplasm of digestive organs: Secondary | ICD-10-CM | POA: Diagnosis not present

## 2016-12-14 DIAGNOSIS — K317 Polyp of stomach and duodenum: Secondary | ICD-10-CM | POA: Diagnosis not present

## 2016-12-14 DIAGNOSIS — R1310 Dysphagia, unspecified: Secondary | ICD-10-CM | POA: Diagnosis not present

## 2016-12-14 DIAGNOSIS — R1013 Epigastric pain: Secondary | ICD-10-CM | POA: Diagnosis not present

## 2016-12-14 DIAGNOSIS — K210 Gastro-esophageal reflux disease with esophagitis: Secondary | ICD-10-CM | POA: Diagnosis not present

## 2016-12-14 DIAGNOSIS — K200 Eosinophilic esophagitis: Secondary | ICD-10-CM | POA: Diagnosis not present

## 2017-01-07 DIAGNOSIS — Z0001 Encounter for general adult medical examination with abnormal findings: Secondary | ICD-10-CM | POA: Diagnosis not present

## 2017-01-07 DIAGNOSIS — F329 Major depressive disorder, single episode, unspecified: Secondary | ICD-10-CM | POA: Diagnosis not present

## 2017-01-07 DIAGNOSIS — E784 Other hyperlipidemia: Secondary | ICD-10-CM | POA: Diagnosis not present

## 2017-01-07 DIAGNOSIS — E785 Hyperlipidemia, unspecified: Secondary | ICD-10-CM | POA: Diagnosis not present

## 2017-01-07 DIAGNOSIS — K219 Gastro-esophageal reflux disease without esophagitis: Secondary | ICD-10-CM | POA: Diagnosis not present

## 2017-01-07 DIAGNOSIS — R739 Hyperglycemia, unspecified: Secondary | ICD-10-CM | POA: Diagnosis not present

## 2017-01-07 DIAGNOSIS — Z6835 Body mass index (BMI) 35.0-35.9, adult: Secondary | ICD-10-CM | POA: Diagnosis not present

## 2017-01-07 DIAGNOSIS — F330 Major depressive disorder, recurrent, mild: Secondary | ICD-10-CM | POA: Diagnosis not present

## 2017-01-28 DIAGNOSIS — R0602 Shortness of breath: Secondary | ICD-10-CM | POA: Diagnosis not present

## 2017-01-28 DIAGNOSIS — R079 Chest pain, unspecified: Secondary | ICD-10-CM | POA: Diagnosis not present

## 2017-02-04 DIAGNOSIS — R079 Chest pain, unspecified: Secondary | ICD-10-CM | POA: Diagnosis not present

## 2017-02-04 DIAGNOSIS — R0602 Shortness of breath: Secondary | ICD-10-CM | POA: Diagnosis not present

## 2017-02-04 DIAGNOSIS — R0789 Other chest pain: Secondary | ICD-10-CM | POA: Diagnosis not present

## 2017-03-08 DIAGNOSIS — E559 Vitamin D deficiency, unspecified: Secondary | ICD-10-CM | POA: Diagnosis not present

## 2017-03-08 DIAGNOSIS — K219 Gastro-esophageal reflux disease without esophagitis: Secondary | ICD-10-CM | POA: Diagnosis not present

## 2017-03-08 DIAGNOSIS — E784 Other hyperlipidemia: Secondary | ICD-10-CM | POA: Diagnosis not present

## 2017-05-10 DIAGNOSIS — K219 Gastro-esophageal reflux disease without esophagitis: Secondary | ICD-10-CM | POA: Diagnosis not present

## 2017-05-10 DIAGNOSIS — E7849 Other hyperlipidemia: Secondary | ICD-10-CM | POA: Diagnosis not present

## 2017-05-10 DIAGNOSIS — I10 Essential (primary) hypertension: Secondary | ICD-10-CM | POA: Diagnosis not present

## 2017-05-10 DIAGNOSIS — F330 Major depressive disorder, recurrent, mild: Secondary | ICD-10-CM | POA: Diagnosis not present

## 2017-08-01 DIAGNOSIS — R0789 Other chest pain: Secondary | ICD-10-CM | POA: Diagnosis not present

## 2017-08-01 DIAGNOSIS — E669 Obesity, unspecified: Secondary | ICD-10-CM | POA: Diagnosis not present

## 2017-08-01 DIAGNOSIS — R079 Chest pain, unspecified: Secondary | ICD-10-CM | POA: Diagnosis not present

## 2017-08-01 DIAGNOSIS — K219 Gastro-esophageal reflux disease without esophagitis: Secondary | ICD-10-CM | POA: Diagnosis not present

## 2017-08-09 DIAGNOSIS — F909 Attention-deficit hyperactivity disorder, unspecified type: Secondary | ICD-10-CM | POA: Diagnosis not present

## 2017-08-09 DIAGNOSIS — K219 Gastro-esophageal reflux disease without esophagitis: Secondary | ICD-10-CM | POA: Diagnosis not present

## 2017-08-09 DIAGNOSIS — I10 Essential (primary) hypertension: Secondary | ICD-10-CM | POA: Diagnosis not present

## 2017-08-09 DIAGNOSIS — F330 Major depressive disorder, recurrent, mild: Secondary | ICD-10-CM | POA: Diagnosis not present

## 2017-08-16 DIAGNOSIS — Z01419 Encounter for gynecological examination (general) (routine) without abnormal findings: Secondary | ICD-10-CM | POA: Diagnosis not present

## 2017-08-16 DIAGNOSIS — R1031 Right lower quadrant pain: Secondary | ICD-10-CM | POA: Diagnosis not present

## 2017-08-16 DIAGNOSIS — Z6835 Body mass index (BMI) 35.0-35.9, adult: Secondary | ICD-10-CM | POA: Diagnosis not present

## 2017-09-02 ENCOUNTER — Ambulatory Visit (HOSPITAL_COMMUNITY): Payer: Self-pay | Admitting: Psychiatry

## 2017-09-02 ENCOUNTER — Ambulatory Visit (HOSPITAL_COMMUNITY): Payer: BLUE CROSS/BLUE SHIELD | Admitting: Psychiatry

## 2017-09-02 ENCOUNTER — Encounter (HOSPITAL_COMMUNITY): Payer: Self-pay | Admitting: Psychiatry

## 2017-09-02 VITALS — BP 147/77 | HR 76 | Ht 65.0 in | Wt 216.0 lb

## 2017-09-02 DIAGNOSIS — Z87891 Personal history of nicotine dependence: Secondary | ICD-10-CM

## 2017-09-02 DIAGNOSIS — F900 Attention-deficit hyperactivity disorder, predominantly inattentive type: Secondary | ICD-10-CM

## 2017-09-02 DIAGNOSIS — G4700 Insomnia, unspecified: Secondary | ICD-10-CM | POA: Diagnosis not present

## 2017-09-02 DIAGNOSIS — Z5689 Other problems related to employment: Secondary | ICD-10-CM

## 2017-09-02 DIAGNOSIS — Z811 Family history of alcohol abuse and dependence: Secondary | ICD-10-CM

## 2017-09-02 DIAGNOSIS — F419 Anxiety disorder, unspecified: Secondary | ICD-10-CM | POA: Diagnosis not present

## 2017-09-02 DIAGNOSIS — Z818 Family history of other mental and behavioral disorders: Secondary | ICD-10-CM | POA: Diagnosis not present

## 2017-09-02 DIAGNOSIS — R450 Nervousness: Secondary | ICD-10-CM | POA: Diagnosis not present

## 2017-09-02 MED ORDER — METHYLPHENIDATE HCL ER (OSM) 54 MG PO TBCR: 54.0000 mg | EXTENDED_RELEASE_TABLET | Freq: Every day | ORAL | 0 refills | Status: DC

## 2017-09-02 NOTE — Progress Notes (Signed)
[facility] MD/PA/NP OP Progress Note  09/02/2017 9:04 AM Laurie Reyes  MRN:  [id number]  Chief Complaint:  Chief Complaint    Depression; Anxiety; ADHD; Follow-up     HPI: This patient is a 57 year old married black female who lives with her husband and great niece in [place].  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  The patient returns after a long absence.  She was last seen in September 2017.  For whatever reason she had not followed up.  She has been extremely busy.  She states that she is under a lot of stress.  Her job has totally changed her protocol.  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  She states that now it is "all about the numbers ."She is constantly asked to make "is and she feels under a great deal of stress.  She still takes Celexa and trazodone prescribed by her family physician.  However she is no longer on any medications for ADD.  She is very scattered and unfocused and would like to get back on something.  The patient was last on Vyvanse 70 mg however when it wore off she ate a lot and states she did not like this feeling.  Interestingly in looking back she weighed about 10 pounds less when she was on the medication.  Nevertheless I suggested we try something else such as Concerta and she is willing to give this a try.  She is stressed but she denies being seriously depressed or suicidal and she has been able to go to work every day Visit Diagnosis:    ICD-10-CM   1. Attention deficit hyperactivity disorder (ADHD), predominantly inattentive type F90.0     Past Psychiatric History: Previous outpatient treatment in our office  Past Medical History:  Past Medical History:  Diagnosis Date  . ADHD (attention deficit hyperactivity disorder)   . Gastric reflux   . Hot flashes, menopausal     Past Surgical History:  Procedure Laterality Date  . NASAL SINUS  SURGERY    . SHOULDER SURGERY      Family Psychiatric History: See below  Family History:  Family History  Problem Relation Age of Onset  . Alcohol abuse Father   . ADD / ADHD Other   . ADD / ADHD Other     Social History:  Social History   Socioeconomic History  . Marital status: Married    Spouse name: None  . Number of children: None  . Years of education: None  . Highest education level: None  Social Needs  . Financial resource strain: None  . Food insecurity - worry: None  . Food insecurity - inability: None  . Transportation needs - medical: None  . Transportation needs - non-medical: None  Occupational History  . None  Tobacco Use  . Smoking status: Former Research scientist (life sciences)  . Smokeless tobacco: Never Used  Substance and Sexual Activity  . Alcohol use: No  . Drug use: No  . Sexual activity: Yes  Other Topics Concern  . None  Social History Narrative  . None    Allergies:  Allergies  Allergen Reactions  . Penicillins   . Shellfish Allergy   . Sulfa Antibiotics     Metabolic Disorder Labs: No results found for: HGBA1C, MPG No results found for: PROLACTIN No results found for: CHOL, TRIG, HDL, CHOLHDL, VLDL, LDLCALC No results found  for: TSH  Therapeutic Level Labs: No results found for: LITHIUM No results found for: VALPROATE No components found for:  CBMZ  Current Medications: Current Outpatient Medications  Medication Sig Dispense Refill  . amLODipine-atorvastatin (CADUET) 5-10 MG tablet Take 1 tablet by mouth daily.    . citalopram (CELEXA) 40 MG tablet Take 1 tablet (40 mg total) by mouth daily. 30 tablet 2  . EPINEPHrine (EPI-PEN) 0.3 mg/0.3 mL SOAJ injection     . pantoprazole (PROTONIX) 40 MG tablet Take 40 mg by mouth 2 (two) times daily.     . traZODone (DESYREL) 100 MG tablet Take 1 tablet (100 mg total) by mouth at bedtime. 30 tablet 0  . methylphenidate 54 MG PO CR tablet Take 1 tablet (54 mg total) by mouth daily. 30 tablet 0   No  current facility-administered medications for this visit.      Musculoskeletal: Strength & Muscle Tone: within normal limits Gait & Station: normal Patient leans: N/A  Psychiatric Specialty Exam: Review of Systems  Psychiatric/Behavioral: The patient is nervous/anxious.   All other systems reviewed and are negative.   Blood pressure (!) 147/77, pulse 76, height 5\' 5"  (1.651 m), weight 216 lb (98 kg), SpO2 97 %.Body mass index is 35.94 kg/m.  General Appearance: Casual, Neat and Well Groomed  Eye Contact:  Good  Speech:  Clear and Coherent  Volume:  Normal  Mood:  Anxious  Affect:  Congruent  Thought Process:  Goal Directed  Orientation:  Full (Time, Place, and Person)  Thought Content: Rumination   Suicidal Thoughts:  No  Homicidal Thoughts:  No  Memory:  Immediate;   Good Recent;   Good Remote;   Fair  Judgement:  Good  Insight:  Fair  Psychomotor Activity:  Normal  Concentration:  Concentration: Poor and Attention Span: Poor  Recall:  Good  Fund of Knowledge: Good  Language: Good  Akathisia:  No  Handed:  Right  AIMS (if indicated): not done  Assets:  Communication Skills Desire for Improvement Physical Health Resilience Social Support Talents/Skills  ADL's:  Intact  Cognition: WNL  Sleep:  Good   Screenings:   Assessment and Plan: This patient is a 57 year old female with a history of depression anxiety and ADD.  She feels that her depression and anxiety are well controlled by the Celexa and trazodone that she takes.  She has a lot to keep up with at work and is not able to stay focused.  We will try methylphenidate 54 mg CR every morning.  She will return to see me in 4 weeks   Levonne Spiller, MD 09/02/2017, 9:04 AM

## 2017-09-30 ENCOUNTER — Encounter (HOSPITAL_COMMUNITY): Payer: Self-pay | Admitting: Psychiatry

## 2017-09-30 ENCOUNTER — Ambulatory Visit (INDEPENDENT_AMBULATORY_CARE_PROVIDER_SITE_OTHER): Payer: BLUE CROSS/BLUE SHIELD | Admitting: Psychiatry

## 2017-09-30 VITALS — BP 121/81 | HR 73 | Ht 65.0 in | Wt 213.0 lb

## 2017-09-30 DIAGNOSIS — Z811 Family history of alcohol abuse and dependence: Secondary | ICD-10-CM | POA: Diagnosis not present

## 2017-09-30 DIAGNOSIS — Z818 Family history of other mental and behavioral disorders: Secondary | ICD-10-CM | POA: Diagnosis not present

## 2017-09-30 DIAGNOSIS — Z5689 Other problems related to employment: Secondary | ICD-10-CM | POA: Diagnosis not present

## 2017-09-30 DIAGNOSIS — F988 Other specified behavioral and emotional disorders with onset usually occurring in childhood and adolescence: Secondary | ICD-10-CM | POA: Diagnosis not present

## 2017-09-30 DIAGNOSIS — F900 Attention-deficit hyperactivity disorder, predominantly inattentive type: Secondary | ICD-10-CM | POA: Diagnosis not present

## 2017-09-30 DIAGNOSIS — Z87891 Personal history of nicotine dependence: Secondary | ICD-10-CM

## 2017-09-30 MED ORDER — CITALOPRAM HYDROBROMIDE 40 MG PO TABS: 40.0000 mg | ORAL_TABLET | Freq: Every day | ORAL | 2 refills | Status: DC

## 2017-09-30 MED ORDER — LISDEXAMFETAMINE DIMESYLATE 70 MG PO CAPS: 70.0000 mg | ORAL_CAPSULE | Freq: Every day | ORAL | 0 refills | Status: DC

## 2017-09-30 MED ORDER — TRAZODONE HCL 100 MG PO TABS: 100.0000 mg | ORAL_TABLET | Freq: Every day | ORAL | 2 refills | Status: DC

## 2017-09-30 NOTE — Progress Notes (Signed)
[name] Progress Note  09/30/2017 11:36 AM Laurie Reyes  MRN:  [id number]  Chief Complaint:  Chief Complaint    ADHD; Depression; Follow-up     HPI: This patient is a 57 year old married black female who lives with her husband and great niece in [place].  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  She was last seen in September 2017.  For whatever reason she had not followed up.  She has been extremely busy.  She states that she is under a lot of stress.  Her job has totally changed her protocol.  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  She states that now it is "all about the numbers ."She is constantly asked to make "is and she feels under a great deal of stress.  She still takes Celexa and trazodone prescribed by her family physician.  However she is no longer on any medications for ADD.  She is very scattered and unfocused and would like to get back on something.  The patient returns after 4 weeks.  Last time she states she was under a lot of stress and unable to focus at work.  She did not want to go back on Vyvanse so he tried Concerta.  However when it wears off in the afternoon she gets very depressed.  She thinks in retrospect she did the best on Vyvanse and wants to go back to it.  She denies being depressed when she took it before.  She is also on Celexa for depression.  She denies suicidal ideation.  She is sleeping well at night  Visit Diagnosis:    ICD-10-CM   1. ADD (attention deficit disorder) F98.8 citalopram (CELEXA) 40 MG tablet    Past Psychiatric History: Previous outpatient treatment in our office  Past Medical History:  Past Medical History:  Diagnosis Date  . ADHD (attention deficit hyperactivity disorder)   . Gastric reflux   . Hot flashes, menopausal     Past Surgical History:  Procedure  Laterality Date  . NASAL SINUS SURGERY    . SHOULDER SURGERY      Family Psychiatric History: See below  Family History:  Family History  Problem Relation Age of Onset  . Alcohol abuse Father   . ADD / ADHD Other   . ADD / ADHD Other     Social History:  Social History   Socioeconomic History  . Marital status: Married    Spouse name: Not on file  . Number of children: Not on file  . Years of education: Not on file  . Highest education level: Not on file  Occupational History  . Not on file  Social Needs  . Financial resource strain: Not on file  . Food insecurity:    Worry: Not on file    Inability: Not on file  . Transportation needs:    Medical: Not on file    Non-medical: Not on file  Tobacco Use  . Smoking status: Former Research scientist (life sciences)  . Smokeless tobacco: Never Used  Substance and Sexual Activity  . Alcohol use: No  . Drug use: No  . Sexual activity: Yes  Lifestyle  . Physical activity:    Days per week: Not on file    Minutes per session: Not on file  . Stress: Not on file  Relationships  .  Social connections:    Talks on phone: Not on file    Gets together: Not on file    Attends religious service: Not on file    Active member of club or organization: Not on file    Attends meetings of clubs or organizations: Not on file    Relationship status: Not on file  Other Topics Concern  . Not on file  Social History Narrative  . Not on file    Allergies:  Allergies  Allergen Reactions  . Penicillins   . Shellfish Allergy   . Sulfa Antibiotics     Metabolic Disorder Labs: No results found for: HGBA1C, MPG No results found for: PROLACTIN No results found for: CHOL, TRIG, HDL, CHOLHDL, VLDL, LDLCALC No results found for: TSH  Therapeutic Level Labs: No results found for: LITHIUM No results found for: VALPROATE No components found for:  CBMZ  Current Medications: Current Outpatient Medications  Medication Sig Dispense Refill  .  amLODipine-atorvastatin (CADUET) 5-10 MG tablet Take 1 tablet by mouth daily.    . citalopram (CELEXA) 40 MG tablet Take 1 tablet (40 mg total) by mouth daily. 30 tablet 2  . EPINEPHrine (EPI-PEN) 0.3 mg/0.3 mL SOAJ injection     . pantoprazole (PROTONIX) 40 MG tablet Take 40 mg by mouth 2 (two) times daily.     . traZODone (DESYREL) 100 MG tablet Take 1 tablet (100 mg total) by mouth at bedtime. 30 tablet 2  . lisdexamfetamine (VYVANSE) 70 MG capsule Take 1 capsule (70 mg total) by mouth daily. 30 capsule 0  . lisdexamfetamine (VYVANSE) 70 MG capsule Take 1 capsule (70 mg total) by mouth daily. 30 capsule 0   No current facility-administered medications for this visit.      Musculoskeletal: Strength & Muscle Tone: within normal limits Gait & Station: normal Patient leans: N/A  Psychiatric Specialty Exam: Review of Systems  All other systems reviewed and are negative.   Blood pressure 121/81, pulse 73, height 5\' 5"  (1.651 m), weight 213 lb (96.6 kg), SpO2 99 %.Body mass index is 35.45 kg/m.  General Appearance: Casual, Neat and Well Groomed  Eye Contact:  Good  Speech:  Clear and Coherent  Volume: Normal   Mood:  Euthymic  Affect:  Congruent  Thought Process:  Goal Directed  Orientation:  Full (Time, Place, and Person)  Thought Content: Rumination   Suicidal Thoughts:  No  Homicidal Thoughts:  No  Memory:  Immediate;   Good Recent;   Good Remote;   Good  Judgement:  Good  Insight:  Fair  Psychomotor Activity:  Normal  Concentration:  Concentration: Fair and Attention Span: Fair  Recall:  Good  Fund of Knowledge: Good  Language: Good  Akathisia:  No  Handed:  Right  AIMS (if indicated): not done  Assets:  Communication Skills Desire for Improvement Physical Health Resilience Social Support Talents/Skills  ADL's:  Intact  Cognition: WNL  Sleep:  Good   Screenings:   Assessment and Plan: This patient is a 57 year old female with a history of depression and  ADD.  The patient felt that Concerta was worsening her depression so we will return to Vyvanse 70 mg every morning for ADHD, continue Celexa 40 mg daily for depression and trazodone 100 mg daily at bedtime for sleep.  She will return to see me in 2 months or call sooner if needed   Levonne Spiller, MD 09/30/2017, 11:36 AM

## 2017-10-17 ENCOUNTER — Other Ambulatory Visit (HOSPITAL_COMMUNITY): Payer: Self-pay | Admitting: Obstetrics and Gynecology

## 2017-10-17 ENCOUNTER — Other Ambulatory Visit (HOSPITAL_COMMUNITY): Payer: Self-pay | Admitting: Internal Medicine

## 2017-10-17 DIAGNOSIS — Z1231 Encounter for screening mammogram for malignant neoplasm of breast: Secondary | ICD-10-CM

## 2017-10-17 DIAGNOSIS — R1031 Right lower quadrant pain: Secondary | ICD-10-CM

## 2017-10-18 DIAGNOSIS — N390 Urinary tract infection, site not specified: Secondary | ICD-10-CM | POA: Diagnosis not present

## 2017-10-18 DIAGNOSIS — M545 Low back pain: Secondary | ICD-10-CM | POA: Diagnosis not present

## 2017-10-18 DIAGNOSIS — R300 Dysuria: Secondary | ICD-10-CM | POA: Diagnosis not present

## 2017-10-19 ENCOUNTER — Other Ambulatory Visit (HOSPITAL_COMMUNITY): Payer: Self-pay

## 2017-10-21 ENCOUNTER — Ambulatory Visit (HOSPITAL_COMMUNITY)
Admission: RE | Admit: 2017-10-21 | Discharge: 2017-10-21 | Disposition: A | Discharge status: Home / Self Care | Payer: BLUE CROSS/BLUE SHIELD | Source: Ambulatory Visit | Attending: Obstetrics and Gynecology | Admitting: Obstetrics and Gynecology

## 2017-10-21 ENCOUNTER — Ambulatory Visit (HOSPITAL_COMMUNITY)
Admission: RE | Admit: 2017-10-21 | Discharge: 2017-10-21 | Disposition: A | Discharge status: Home / Self Care | Payer: BLUE CROSS/BLUE SHIELD | Source: Ambulatory Visit | Attending: Internal Medicine | Admitting: Internal Medicine

## 2017-10-21 ENCOUNTER — Other Ambulatory Visit (HOSPITAL_COMMUNITY): Payer: Self-pay | Admitting: Obstetrics and Gynecology

## 2017-10-21 ENCOUNTER — Other Ambulatory Visit (HOSPITAL_COMMUNITY): Payer: Self-pay | Admitting: Internal Medicine

## 2017-10-21 DIAGNOSIS — D251 Intramural leiomyoma of uterus: Secondary | ICD-10-CM | POA: Diagnosis not present

## 2017-10-21 DIAGNOSIS — R52 Pain, unspecified: Secondary | ICD-10-CM

## 2017-10-21 DIAGNOSIS — R1031 Right lower quadrant pain: Secondary | ICD-10-CM

## 2017-10-21 DIAGNOSIS — Z1231 Encounter for screening mammogram for malignant neoplasm of breast: Secondary | ICD-10-CM

## 2017-10-21 DIAGNOSIS — M545 Low back pain: Secondary | ICD-10-CM | POA: Diagnosis not present

## 2017-10-21 DIAGNOSIS — D259 Leiomyoma of uterus, unspecified: Secondary | ICD-10-CM | POA: Insufficient documentation

## 2017-10-21 DIAGNOSIS — M25562 Pain in left knee: Secondary | ICD-10-CM | POA: Insufficient documentation

## 2017-11-04 ENCOUNTER — Ambulatory Visit (INDEPENDENT_AMBULATORY_CARE_PROVIDER_SITE_OTHER): Payer: BLUE CROSS/BLUE SHIELD | Admitting: Psychiatry

## 2017-11-04 ENCOUNTER — Encounter (HOSPITAL_COMMUNITY): Payer: Self-pay | Admitting: Psychiatry

## 2017-11-04 VITALS — BP 115/75 | HR 81 | Ht 65.0 in | Wt 215.0 lb

## 2017-11-04 DIAGNOSIS — F900 Attention-deficit hyperactivity disorder, predominantly inattentive type: Secondary | ICD-10-CM

## 2017-11-04 DIAGNOSIS — Z87891 Personal history of nicotine dependence: Secondary | ICD-10-CM | POA: Diagnosis not present

## 2017-11-04 DIAGNOSIS — Z818 Family history of other mental and behavioral disorders: Secondary | ICD-10-CM

## 2017-11-04 DIAGNOSIS — R450 Nervousness: Secondary | ICD-10-CM | POA: Diagnosis not present

## 2017-11-04 DIAGNOSIS — Z811 Family history of alcohol abuse and dependence: Secondary | ICD-10-CM | POA: Diagnosis not present

## 2017-11-04 DIAGNOSIS — F988 Other specified behavioral and emotional disorders with onset usually occurring in childhood and adolescence: Secondary | ICD-10-CM

## 2017-11-04 DIAGNOSIS — F419 Anxiety disorder, unspecified: Secondary | ICD-10-CM

## 2017-11-04 MED ORDER — HYDROXYZINE HCL 25 MG PO TABS: 25.0000 mg | ORAL_TABLET | Freq: Every day | ORAL | 2 refills | Status: DC | PRN

## 2017-11-04 MED ORDER — LISDEXAMFETAMINE DIMESYLATE 70 MG PO CAPS: 70.0000 mg | ORAL_CAPSULE | Freq: Every day | ORAL | 0 refills | Status: DC

## 2017-11-04 MED ORDER — CITALOPRAM HYDROBROMIDE 40 MG PO TABS: 40.0000 mg | ORAL_TABLET | Freq: Every day | ORAL | 2 refills | Status: DC

## 2017-11-04 MED ORDER — TRAZODONE HCL 100 MG PO TABS: 100.0000 mg | ORAL_TABLET | Freq: Every day | ORAL | 2 refills | Status: DC

## 2017-11-04 NOTE — Progress Notes (Signed)
[facility] MD/PA/NP OP Progress Note  11/04/2017 11:52 AM Laurie Reyes  MRN:  [id number]  Chief Complaint:  Chief Complaint    Depression; Anxiety; ADD     HPI: This patient is a 57 year old married black female who lives with her husband and great niece in [place].  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  The patient returns after 1 month for treatment of ADHD depression and anxiety.  She states that she is still struggling tremendously with stress at work.  She mentioned last time that since September the workload has increased exponentially and she and her coworkers are constantly being scrutinized.  She is sleeping 7 to 8 hours at night but still feels exhausted.  She is getting irritable and anxious and yelling at her niece and grandchildren.  She is staying focused on the Vyvanse but is very discouraged about her job.  She asked about something for anxiety and does not want to take anything with addiction potential.  I explained that we could try hydroxyzine. Visit Diagnosis:    ICD-10-CM   1. ADD (attention deficit disorder) F98.8 citalopram (CELEXA) 40 MG tablet    Past Psychiatric History: Previous outpatient treatment in our office  Past Medical History:  Past Medical History:  Diagnosis Date  . ADHD (attention deficit hyperactivity disorder)   . Gastric reflux   . Hot flashes, menopausal     Past Surgical History:  Procedure Laterality Date  . NASAL SINUS SURGERY    . SHOULDER SURGERY      Family Psychiatric History: See below  Family History:  Family History  Problem Relation Age of Onset  . Alcohol abuse Father   . ADD / ADHD Other   . ADD / ADHD Other     Social History:  Social History   Socioeconomic History  . Marital status: Married    Spouse name: Not on file  . Number of children: Not on file  . Years of education: Not on file  . Highest education level: Not on file  Occupational  History  . Not on file  Social Needs  . Financial resource strain: Not on file  . Food insecurity:    Worry: Not on file    Inability: Not on file  . Transportation needs:    Medical: Not on file    Non-medical: Not on file  Tobacco Use  . Smoking status: Former Research scientist (life sciences)  . Smokeless tobacco: Never Used  Substance and Sexual Activity  . Alcohol use: No  . Drug use: No  . Sexual activity: Yes  Lifestyle  . Physical activity:    Days per week: Not on file    Minutes per session: Not on file  . Stress: Not on file  Relationships  . Social connections:    Talks on phone: Not on file    Gets together: Not on file    Attends religious service: Not on file    Active member of club or organization: Not on file    Attends meetings of clubs or organizations: Not on file    Relationship status: Not on file  Other Topics Concern  . Not on file  Social History Narrative  . Not on file    Allergies:  Allergies  Allergen Reactions  . Penicillins   . Shellfish Allergy   . Sulfa Antibiotics     Metabolic Disorder Labs: No results found for: HGBA1C, MPG No results found for: PROLACTIN  No results found for: CHOL, TRIG, HDL, CHOLHDL, VLDL, LDLCALC No results found for: TSH  Therapeutic Level Labs: No results found for: LITHIUM No results found for: VALPROATE No components found for:  CBMZ  Current Medications: Current Outpatient Medications  Medication Sig Dispense Refill  . amLODipine-atorvastatin (CADUET) 5-10 MG tablet Take 1 tablet by mouth daily.    . citalopram (CELEXA) 40 MG tablet Take 1 tablet (40 mg total) by mouth daily. 30 tablet 2  . EPINEPHrine (EPI-PEN) 0.3 mg/0.3 mL SOAJ injection     . lisdexamfetamine (VYVANSE) 70 MG capsule Take 1 capsule (70 mg total) by mouth daily. 30 capsule 0  . lisdexamfetamine (VYVANSE) 70 MG capsule Take 1 capsule (70 mg total) by mouth daily. 30 capsule 0  . pantoprazole (PROTONIX) 40 MG tablet Take 40 mg by mouth 2 (two) times  daily.     . traZODone (DESYREL) 100 MG tablet Take 1 tablet (100 mg total) by mouth at bedtime. 30 tablet 2  . hydrOXYzine (ATARAX/VISTARIL) 25 MG tablet Take 1 tablet (25 mg total) by mouth daily as needed for anxiety. 30 tablet 2   No current facility-administered medications for this visit.      Musculoskeletal: Strength & Muscle Tone: within normal limits Gait & Station: normal Patient leans: N/A  Psychiatric Specialty Exam: Review of Systems  Constitutional: Positive for malaise/fatigue.  Psychiatric/Behavioral: The patient is nervous/anxious.   All other systems reviewed and are negative.   Blood pressure 115/75, pulse 81, height 5\' 5"  (1.651 m), weight 215 lb (97.5 kg), SpO2 97 %.Body mass index is 35.78 kg/m.  General Appearance: Casual and Fairly Groomed  Eye Contact:  Good  Speech:  Clear and Coherent  Volume:  Normal  Mood:  Anxious  Affect:  Congruent  Thought Process:  Goal Directed  Orientation:  Full (Time, Place, and Person)  Thought Content: Rumination   Suicidal Thoughts:  No  Homicidal Thoughts:  No  Memory:  Immediate;   Good Recent;   Good Remote;   Good  Judgement:  Good  Insight:  Fair  Psychomotor Activity:  Decreased  Concentration:  Concentration: Good and Attention Span: Good  Recall:  Good  Fund of Knowledge: Good  Language: Good  Akathisia:  No  Handed:  Right  AIMS (if indicated): not done  Assets:  Communication Skills Desire for Improvement Physical Health Resilience Social Support Talents/Skills  ADL's:  Intact  Cognition: WNL  Sleep:  Fair   Screenings:   Assessment and Plan: This patient is a 57 year old female with a history of depression anxiety and ADD.  Her medications are helping but there is nothing we can do medically to fix a bad situation is causing stress at work.  She is trying to find another job.  I suggested she get back into counseling here and she agrees.  We will add hydroxyzine 25 mg daily as needed for  anxiety.  She will continue Celexa 40 mg daily for depression, Vyvanse 70 mg every morning for ADHD symptoms and trazodone 100 mg at bedtime for sleep.  She will return to see me in 2 months   Levonne Spiller, MD 11/04/2017, 11:52 AM

## 2017-11-07 ENCOUNTER — Ambulatory Visit (HOSPITAL_COMMUNITY): Payer: BLUE CROSS/BLUE SHIELD

## 2017-11-17 DIAGNOSIS — M76892 Other specified enthesopathies of left lower limb, excluding foot: Secondary | ICD-10-CM | POA: Diagnosis not present

## 2017-11-17 DIAGNOSIS — M25562 Pain in left knee: Secondary | ICD-10-CM | POA: Diagnosis not present

## 2017-12-09 ENCOUNTER — Ambulatory Visit (HOSPITAL_COMMUNITY): Payer: Self-pay | Admitting: Psychiatry

## 2017-12-09 DIAGNOSIS — R197 Diarrhea, unspecified: Secondary | ICD-10-CM | POA: Diagnosis not present

## 2017-12-09 DIAGNOSIS — I10 Essential (primary) hypertension: Secondary | ICD-10-CM | POA: Diagnosis not present

## 2017-12-09 DIAGNOSIS — F330 Major depressive disorder, recurrent, mild: Secondary | ICD-10-CM | POA: Diagnosis not present

## 2017-12-09 DIAGNOSIS — J029 Acute pharyngitis, unspecified: Secondary | ICD-10-CM | POA: Diagnosis not present

## 2017-12-14 DIAGNOSIS — M5417 Radiculopathy, lumbosacral region: Secondary | ICD-10-CM | POA: Diagnosis not present

## 2017-12-16 DIAGNOSIS — F330 Major depressive disorder, recurrent, mild: Secondary | ICD-10-CM | POA: Diagnosis not present

## 2017-12-16 DIAGNOSIS — I10 Essential (primary) hypertension: Secondary | ICD-10-CM | POA: Diagnosis not present

## 2017-12-16 DIAGNOSIS — F909 Attention-deficit hyperactivity disorder, unspecified type: Secondary | ICD-10-CM | POA: Diagnosis not present

## 2017-12-16 DIAGNOSIS — Z6830 Body mass index (BMI) 30.0-30.9, adult: Secondary | ICD-10-CM | POA: Diagnosis not present

## 2017-12-29 ENCOUNTER — Ambulatory Visit (HOSPITAL_COMMUNITY): Payer: Self-pay | Admitting: Psychiatry

## 2017-12-29 ENCOUNTER — Encounter (HOSPITAL_COMMUNITY): Payer: Self-pay | Admitting: Psychiatry

## 2017-12-29 ENCOUNTER — Ambulatory Visit (HOSPITAL_COMMUNITY): Payer: BLUE CROSS/BLUE SHIELD | Admitting: Psychiatry

## 2017-12-29 VITALS — BP 131/74 | HR 75 | Ht 65.0 in | Wt 207.0 lb

## 2017-12-29 DIAGNOSIS — F321 Major depressive disorder, single episode, moderate: Secondary | ICD-10-CM | POA: Diagnosis not present

## 2017-12-29 DIAGNOSIS — F900 Attention-deficit hyperactivity disorder, predominantly inattentive type: Secondary | ICD-10-CM

## 2017-12-29 MED ORDER — HYDROXYZINE HCL 25 MG PO TABS: 25.0000 mg | ORAL_TABLET | Freq: Every day | ORAL | 2 refills | Status: DC | PRN

## 2017-12-29 MED ORDER — DULOXETINE HCL 60 MG PO CPEP: 60.0000 mg | ORAL_CAPSULE | Freq: Every day | ORAL | 2 refills | Status: DC

## 2017-12-29 MED ORDER — LISDEXAMFETAMINE DIMESYLATE 70 MG PO CAPS: 70.0000 mg | ORAL_CAPSULE | Freq: Every day | ORAL | 0 refills | Status: DC

## 2017-12-29 MED ORDER — TRAZODONE HCL 100 MG PO TABS: 200.0000 mg | ORAL_TABLET | Freq: Every day | ORAL | 2 refills | Status: DC

## 2017-12-29 NOTE — Progress Notes (Signed)
[place] MD/PA/NP OP Progress Note  12/29/2017 9:08 AM Laurie Reyes  MRN:  [id number]  Chief Complaint:  Chief Complaint    Depression; Anxiety; Follow-up     HPI: This patient is a 57 year old married black female who lives with her husband and great niece in [place].  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  The patient returns after 6 weeks.  She is seen as a work in today.  She had told me several months ago that her workload has increased exponentially and that she and her coworkers were exhausted and overwhelmed.  They have complained of management but nothing is changed.  She states that she had "a wall" about 2 weeks ago and went to her primary doctor.  She is overwhelmed stressed tearful unable to sleep.  He took her out of work for 2 weeks.  She also is dealing with sciatica and going to physical therapy.  She states that she is becoming more depressed and extremely anxious about going back to work.  She does not feel like she can face the stress right now.  She denies being suicidal but has been increasingly depressed and lethargic and asked if we could try different antidepressant.  Given that the patient is also having pain issues I suggested she discontinue Celexa and start Cymbalta.  The trazodone does not work unless she takes 2 of them so this will be increased as well.  The Vyvanse continues to help her focus.  She thinks she needs a few more weeks off and we decided that she would return to work August 5 but in the meantime she is going to look for other employment Visit Diagnosis:    ICD-10-CM   1. Current moderate episode of major depressive disorder, unspecified whether recurrent ([place]) F32.1   2. Attention deficit hyperactivity disorder (ADHD), predominantly inattentive type F90.0     Past Psychiatric History: Previous outpatient treatment in our office  Past Medical History:  Past Medical History:   Diagnosis Date  . ADHD (attention deficit hyperactivity disorder)   . Gastric reflux   . Hot flashes, menopausal     Past Surgical History:  Procedure Laterality Date  . NASAL SINUS SURGERY    . SHOULDER SURGERY      Family Psychiatric History: See below  Family History:  Family History  Problem Relation Age of Onset  . Alcohol abuse Father   . ADD / ADHD Other   . ADD / ADHD Other     Social History:  Social History   Socioeconomic History  . Marital status: Married    Spouse name: Not on file  . Number of children: Not on file  . Years of education: Not on file  . Highest education level: Not on file  Occupational History  . Not on file  Social Needs  . Financial resource strain: Not on file  . Food insecurity:    Worry: Not on file    Inability: Not on file  . Transportation needs:    Medical: Not on file    Non-medical: Not on file  Tobacco Use  . Smoking status: Former Research scientist (life sciences)  . Smokeless tobacco: Never Used  Substance and Sexual Activity  . Alcohol use: No  . Drug use: No  . Sexual activity: Yes  Lifestyle  . Physical activity:    Days per week: Not on file    Minutes per session: Not on file  . Stress:  Not on file  Relationships  . Social connections:    Talks on phone: Not on file    Gets together: Not on file    Attends religious service: Not on file    Active member of club or organization: Not on file    Attends meetings of clubs or organizations: Not on file    Relationship status: Not on file  Other Topics Concern  . Not on file  Social History Narrative  . Not on file    Allergies:  Allergies  Allergen Reactions  . Penicillins   . Shellfish Allergy   . Sulfa Antibiotics     Metabolic Disorder Labs: No results found for: HGBA1C, MPG No results found for: PROLACTIN No results found for: CHOL, TRIG, HDL, CHOLHDL, VLDL, LDLCALC No results found for: TSH  Therapeutic Level Labs: No results found for: LITHIUM No results found  for: VALPROATE No components found for:  CBMZ  Current Medications: Current Outpatient Medications  Medication Sig Dispense Refill  . amLODipine-atorvastatin (CADUET) 5-10 MG tablet Take 1 tablet by mouth daily.    Marland Kitchen EPINEPHrine (EPI-PEN) 0.3 mg/0.3 mL SOAJ injection     . hydrOXYzine (ATARAX/VISTARIL) 25 MG tablet Take 1 tablet (25 mg total) by mouth daily as needed for anxiety. 30 tablet 2  . lisdexamfetamine (VYVANSE) 70 MG capsule Take 1 capsule (70 mg total) by mouth daily. 30 capsule 0  . lisdexamfetamine (VYVANSE) 70 MG capsule Take 1 capsule (70 mg total) by mouth daily. 30 capsule 0  . pantoprazole (PROTONIX) 40 MG tablet Take 40 mg by mouth 2 (two) times daily.     . traZODone (DESYREL) 100 MG tablet Take 2 tablets (200 mg total) by mouth at bedtime. 60 tablet 2  . DULoxetine (CYMBALTA) 60 MG capsule Take 1 capsule (60 mg total) by mouth daily. 30 capsule 2   No current facility-administered medications for this visit.      Musculoskeletal: Strength & Muscle Tone: within normal limits Gait & Station: normal Patient leans: N/A  Psychiatric Specialty Exam: Review of Systems  Musculoskeletal: Positive for back pain.  Psychiatric/Behavioral: Positive for depression. The patient is nervous/anxious and has insomnia.   All other systems reviewed and are negative.   Blood pressure 131/74, pulse 75, height 5\' 5"  (1.651 m), weight 207 lb (93.9 kg), SpO2 98 %.Body mass index is 34.45 kg/m.  General Appearance: Casual and Fairly Groomed  Eye Contact:  Good  Speech:  Clear and Coherent  Volume:  Decreased  Mood:  Anxious, Depressed and Worthless  Affect:  Constricted, Depressed and Tearful  Thought Process:  Goal Directed  Orientation:  Full (Time, Place, and Person)  Thought Content: Rumination   Suicidal Thoughts:  No  Homicidal Thoughts:  No  Memory:  Immediate;   Good Recent;   Good Remote;   Good  Judgement:  Good  Insight:  Good  Psychomotor Activity:  Decreased   Concentration:  Concentration: Fair and Attention Span: Fair  Recall:  Good  Fund of Knowledge: Good  Language: Good  Akathisia:  No  Handed:  Right  AIMS (if indicated): not done  Assets:  Communication Skills Desire for Improvement Resilience Social Support Talents/Skills  ADL's:  Intact  Cognition: WNL  Sleep:  Poor   Screenings:   Assessment and Plan: This patient is a 57 year old female with a history of depression anxiety and ADHD.  She is gotten totally overwhelmed by the conditions at her workplace.  She feels overworked and constantly scrutinized.  At this point we will change her antidepressant from Celexa to Cymbalta 60 mg daily.  She will continue trazodone but increase to 100 mg at bedtime and also continue hydroxyzine 25 mg 3 times daily as needed for anxiety and Vyvanse 70 mg every morning for ADD.  I do not think she is stable enough to return to work at this time and suggest we set a return to work date on August 5.  I explained to her that I will fill out paperwork from her job explaining the situation.  The patient will return to see me in 4 weeks   Levonne Spiller, MD 12/29/2017, 9:08 AM

## 2017-12-30 ENCOUNTER — Ambulatory Visit (HOSPITAL_COMMUNITY): Payer: Self-pay | Admitting: Psychiatry

## 2018-01-02 ENCOUNTER — Telehealth: Payer: Self-pay | Admitting: Psychiatry

## 2018-01-02 NOTE — Telephone Encounter (Signed)
01/02/18  10:08am Called [employer] insurance due to electronic verification of this insurance was rejecting - spoke with Elmarie Main[place]. representative  - stating the the member ID # is [id number]   Group #[id number] - coverage effective 12/19/17. After this information the insurance was e- verified without any issues. Marland KitchenMariana Kaufman

## 2018-01-27 ENCOUNTER — Encounter (HOSPITAL_COMMUNITY): Payer: Self-pay | Admitting: Psychiatry

## 2018-01-27 ENCOUNTER — Ambulatory Visit (INDEPENDENT_AMBULATORY_CARE_PROVIDER_SITE_OTHER): Payer: BLUE CROSS/BLUE SHIELD | Admitting: Psychiatry

## 2018-01-27 VITALS — BP 160/83 | HR 83 | Ht 65.0 in | Wt 205.0 lb

## 2018-01-27 DIAGNOSIS — F900 Attention-deficit hyperactivity disorder, predominantly inattentive type: Secondary | ICD-10-CM | POA: Diagnosis not present

## 2018-01-27 DIAGNOSIS — Z87891 Personal history of nicotine dependence: Secondary | ICD-10-CM | POA: Diagnosis not present

## 2018-01-27 DIAGNOSIS — Z811 Family history of alcohol abuse and dependence: Secondary | ICD-10-CM

## 2018-01-27 DIAGNOSIS — F321 Major depressive disorder, single episode, moderate: Secondary | ICD-10-CM | POA: Diagnosis not present

## 2018-01-27 MED ORDER — TRAZODONE HCL 100 MG PO TABS: 200.0000 mg | ORAL_TABLET | Freq: Every day | ORAL | 2 refills | Status: DC

## 2018-01-27 MED ORDER — DULOXETINE HCL 60 MG PO CPEP: 60.0000 mg | ORAL_CAPSULE | Freq: Every day | ORAL | 2 refills | Status: DC

## 2018-01-27 MED ORDER — LISDEXAMFETAMINE DIMESYLATE 70 MG PO CAPS: 70.0000 mg | ORAL_CAPSULE | Freq: Every day | ORAL | 0 refills | Status: DC

## 2018-01-27 NOTE — Progress Notes (Signed)
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  She has been off work on medical leave since July 1.  The patient returns for follow-up today after 4 weeks.  She has been off work because the work got so stressful that she became tearful overwhelmed unable to sleep and depressed.  She has been [facility].  Last time we switched her from Celexa to Cymbalta and she seems to be feeling better her mood has improved and she is feeling less anxious about going back to work.  She is sleeping well at night.  She looks far more rested and less stressed.  She denies suicidal ideation.  She has decided that she will try to go back to work on Monday which is 01/30/2018.  She has bought some sound machines that make peaceful noises like grain as well as uplifting posters and sings to put around her cubicle.  She is going to try to do the best that she can at work but is also looking for other employment. Visit Diagnosis:    ICD-10-CM   1. Current moderate episode of major depressive disorder, unspecified whether recurrent ([place]) F32.1   2. Attention deficit hyperactivity disorder (ADHD), predominantly inattentive type F90.0     Past Psychiatric History: Previous outpatient treatment in our office  Past Medical History:  Past Medical History:  Diagnosis Date  . ADHD (attention deficit hyperactivity disorder)   . Gastric reflux   . Hot flashes, menopausal     Past Surgical History:  Procedure Laterality Date  . NASAL SINUS SURGERY    . SHOULDER SURGERY      Family Psychiatric History: See below  Family History:  Family History   Problem Relation Age of Onset  . Alcohol abuse Father   . ADD / ADHD Other   . ADD / ADHD Other     Social History:  Social History   Socioeconomic History  . Marital status: Married    Spouse name: Not on file  . Number of children: Not on file  . Years of education: Not on file  . Highest education level: Not on file  Occupational History  . Not on file  Social Needs  . Financial resource strain: Not on file  . Food insecurity:    Worry: Not on file    Inability: Not on file  . Transportation needs:    Medical: Not on file    Non-medical: Not on file  Tobacco Use  . Smoking status: Former Research scientist (life sciences)  . Smokeless tobacco: Never Used  Substance and Sexual Activity  . Alcohol use: No  . Drug use: No  . Sexual activity: Yes  Lifestyle  . Physical activity:    Days per week: Not on file    Minutes per session: Not on file  . Stress: Not on file  Relationships  . Social connections:    Talks on phone: Not on file    Gets together: Not on file    Attends religious service: Not on file    Active member of club or organization: Not on file    Attends meetings  of clubs or organizations: Not on file    Relationship status: Not on file  Other Topics Concern  . Not on file  Social History Narrative  . Not on file    Allergies:  Allergies  Allergen Reactions  . Penicillins   . Shellfish Allergy   . Sulfa Antibiotics     Metabolic Disorder Labs: No results found for: HGBA1C, MPG No results found for: PROLACTIN No results found for: CHOL, TRIG, HDL, CHOLHDL, VLDL, LDLCALC No results found for: TSH  Therapeutic Level Labs: No results found for: LITHIUM No results found for: VALPROATE No components found for:  CBMZ  Current Medications: Current Outpatient Medications  Medication Sig Dispense Refill  . amLODipine-atorvastatin (CADUET) 5-10 MG tablet Take 1 tablet by mouth daily.    . DULoxetine (CYMBALTA) 60 MG capsule Take 1 capsule (60 mg total) by mouth  daily. 30 capsule 2  . EPINEPHrine (EPI-PEN) 0.3 mg/0.3 mL SOAJ injection     . hydrOXYzine (ATARAX/VISTARIL) 25 MG tablet Take 1 tablet (25 mg total) by mouth daily as needed for anxiety. 30 tablet 2  . lisdexamfetamine (VYVANSE) 70 MG capsule Take 1 capsule (70 mg total) by mouth daily. 30 capsule 0  . lisdexamfetamine (VYVANSE) 70 MG capsule Take 1 capsule (70 mg total) by mouth daily. 30 capsule 0  . pantoprazole (PROTONIX) 40 MG tablet Take 40 mg by mouth 2 (two) times daily.     . traZODone (DESYREL) 100 MG tablet Take 2 tablets (200 mg total) by mouth at bedtime. 60 tablet 2   No current facility-administered medications for this visit.      Musculoskeletal: Strength & Muscle Tone: within normal limits Gait & Station: normal Patient leans: N/A  Psychiatric Specialty Exam: Review of Systems  All other systems reviewed and are negative.   Blood pressure (!) 160/83, pulse 83, height 5\' 5"  (1.651 m), weight 205 lb (93 kg), SpO2 96 %.Body mass index is 34.11 kg/m.  General Appearance: Casual, Neat and Well Groomed  Eye Contact:  Good  Speech:  Clear and Coherent  Volume:  Normal  Mood:  Euthymic  Affect:  Congruent  Thought Process:  Goal Directed  Orientation:  Full (Time, Place, and Person)  Thought Content: WDL   Suicidal Thoughts:  No  Homicidal Thoughts:  No  Memory:  Immediate;   Good Recent;   Good Remote;   Good  Judgement:  Good  Insight:  Fair  Psychomotor Activity:  Normal  Concentration:  Concentration: Good and Attention Span: Good  Recall:  Good  Fund of Knowledge: Good  Language: Good  Akathisia:  No  Handed:  Right  AIMS (if indicated): not done  Assets:  Communication Skills Desire for Improvement Physical Health Resilience Social Support Talents/Skills  ADL's:  Intact  Cognition: WNL  Sleep:  Good   Screenings:   Assessment and Plan: This patient is a 57 year old female with a history of depression anxiety and ADD.  She has been off work  for over a month because of the extremely stressful working conditions that she describes, being overworked with unrealistic expectations on production.  She has had time to rest and recoup.  She is also on a new antidepressant which she thinks is working better.  I have released her to return to work on 01/30/2018.  She will continue Vyvanse 70 mg every morning for focus, trazodone 200 mg at bedtime for sleep and Cymbalta 60 mg daily for depression.  She will return to see me  in 4 weeks and continue her counseling here.   Levonne Spiller, MD 01/27/2018, 11:41 AM

## 2018-02-08 DIAGNOSIS — M545 Low back pain: Secondary | ICD-10-CM | POA: Diagnosis not present

## 2018-02-08 DIAGNOSIS — M5416 Radiculopathy, lumbar region: Secondary | ICD-10-CM | POA: Diagnosis not present

## 2018-02-08 DIAGNOSIS — M5136 Other intervertebral disc degeneration, lumbar region: Secondary | ICD-10-CM | POA: Diagnosis not present

## 2018-02-16 DIAGNOSIS — R55 Syncope and collapse: Secondary | ICD-10-CM | POA: Diagnosis not present

## 2018-02-16 DIAGNOSIS — F330 Major depressive disorder, recurrent, mild: Secondary | ICD-10-CM | POA: Diagnosis not present

## 2018-02-22 DIAGNOSIS — R55 Syncope and collapse: Secondary | ICD-10-CM | POA: Diagnosis not present

## 2018-02-22 DIAGNOSIS — S01511D Laceration without foreign body of lip, subsequent encounter: Secondary | ICD-10-CM | POA: Diagnosis not present

## 2018-02-28 DIAGNOSIS — R569 Unspecified convulsions: Secondary | ICD-10-CM | POA: Diagnosis not present

## 2018-02-28 DIAGNOSIS — R55 Syncope and collapse: Secondary | ICD-10-CM | POA: Diagnosis not present

## 2018-02-28 DIAGNOSIS — E785 Hyperlipidemia, unspecified: Secondary | ICD-10-CM | POA: Diagnosis not present

## 2018-02-28 DIAGNOSIS — I10 Essential (primary) hypertension: Secondary | ICD-10-CM | POA: Diagnosis not present

## 2018-02-28 DIAGNOSIS — M545 Low back pain: Secondary | ICD-10-CM | POA: Diagnosis not present

## 2018-03-02 DIAGNOSIS — R569 Unspecified convulsions: Secondary | ICD-10-CM | POA: Diagnosis not present

## 2018-03-03 ENCOUNTER — Encounter (HOSPITAL_COMMUNITY): Payer: Self-pay | Admitting: Psychiatry

## 2018-03-03 ENCOUNTER — Ambulatory Visit (INDEPENDENT_AMBULATORY_CARE_PROVIDER_SITE_OTHER): Payer: BLUE CROSS/BLUE SHIELD | Admitting: Psychiatry

## 2018-03-03 VITALS — BP 126/78 | HR 81 | Ht 65.0 in | Wt 204.0 lb

## 2018-03-03 DIAGNOSIS — Z87891 Personal history of nicotine dependence: Secondary | ICD-10-CM

## 2018-03-03 DIAGNOSIS — F321 Major depressive disorder, single episode, moderate: Secondary | ICD-10-CM

## 2018-03-03 DIAGNOSIS — Z811 Family history of alcohol abuse and dependence: Secondary | ICD-10-CM

## 2018-03-03 DIAGNOSIS — F900 Attention-deficit hyperactivity disorder, predominantly inattentive type: Secondary | ICD-10-CM | POA: Diagnosis not present

## 2018-03-03 DIAGNOSIS — Z818 Family history of other mental and behavioral disorders: Secondary | ICD-10-CM | POA: Diagnosis not present

## 2018-03-03 MED ORDER — LISDEXAMFETAMINE DIMESYLATE 70 MG PO CAPS: 70.0000 mg | ORAL_CAPSULE | Freq: Every day | ORAL | 0 refills | Status: DC

## 2018-03-03 MED ORDER — TRAZODONE HCL 100 MG PO TABS: 200.0000 mg | ORAL_TABLET | Freq: Every day | ORAL | 2 refills | Status: DC

## 2018-03-03 MED ORDER — DULOXETINE HCL 60 MG PO CPEP
60.0000 mg | ORAL_CAPSULE | Freq: Every day | ORAL | 2 refills | Status: DC
Start: 2018-03-03 — End: 2018-05-05

## 2018-03-03 NOTE — Progress Notes (Signed)
[facility] MD/PA/NP OP Progress Note  03/03/2018 8:32 AM Laurie Reyes  MRN:  [id number]  Chief Complaint:  Chief Complaint    Depression; Anxiety; ADD; Follow-up     HPI: This patient is a 57 year old married black female who lives with her husband and great niece in [place].  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  She had taken time off work because of stress however she went back on August 12.  She states however at the end of August she came home from work one day was getting ready for bed and fell out in her house.  She is had a sudden sharp pain in her forehead and next thing she knew she is passed out on the floor.  She was taken to the [facility] emergency room and her brain CT and other studies were negative.  She is now seeing Dr. Merlene Laughter neurology and had an EEG yesterday.  She has not had any further falling out spells or headaches.  He is trying to rule out seizure disorder and has taken her out of work again until this can get figured out.  The patient states she was under a lot of stress because her sister and niece were trying to take her great-niece away from her.  This did not happen but they were making threats.  They came to visit and left without doing anything.  She states that she is feeling better now her mood is good she is sleeping well.  She feels ready to return to work once the neurological studies are completed. Visit Diagnosis:    ICD-10-CM   1. Current moderate episode of major depressive disorder, unspecified whether recurrent ([place]) F32.1   2. Attention deficit hyperactivity disorder (ADHD), predominantly inattentive type F90.0     Past Psychiatric History: Previous outpatient treatment in our office  Past Medical History:  Past Medical History:  Diagnosis Date  . ADHD (attention deficit hyperactivity  disorder)   . Gastric reflux   . Hot flashes, menopausal     Past Surgical History:  Procedure Laterality Date  . NASAL SINUS SURGERY    . SHOULDER SURGERY      Family Psychiatric History: See below  Family History:  Family History  Problem Relation Age of Onset  . Alcohol abuse Father   . ADD / ADHD Other   . ADD / ADHD Other     Social History:  Social History   Socioeconomic History  . Marital status: Married    Spouse name: Not on file  . Number of children: Not on file  . Years of education: Not on file  . Highest education level: Not on file  Occupational History  . Not on file  Social Needs  . Financial resource strain: Not on file  . Food insecurity:    Worry: Not on file    Inability: Not on file  . Transportation needs:    Medical: Not on file    Non-medical: Not on file  Tobacco Use  . Smoking status: Former Research scientist (life sciences)  . Smokeless tobacco: Never Used  Substance and Sexual Activity  . Alcohol use: No  . Drug use: No  . Sexual activity: Yes  Lifestyle  . Physical activity:    Days per week: Not on file    Minutes per session: Not on file  . Stress:  Not on file  Relationships  . Social connections:    Talks on phone: Not on file    Gets together: Not on file    Attends religious service: Not on file    Active member of club or organization: Not on file    Attends meetings of clubs or organizations: Not on file    Relationship status: Not on file  Other Topics Concern  . Not on file  Social History Narrative  . Not on file    Allergies:  Allergies  Allergen Reactions  . Penicillins   . Shellfish Allergy   . Sulfa Antibiotics     Metabolic Disorder Labs: No results found for: HGBA1C, MPG No results found for: PROLACTIN No results found for: CHOL, TRIG, HDL, CHOLHDL, VLDL, LDLCALC No results found for: TSH  Therapeutic Level Labs: No results found for: LITHIUM No results found for: VALPROATE No components found for:  CBMZ  Current  Medications: Current Outpatient Medications  Medication Sig Dispense Refill  . amLODipine-atorvastatin (CADUET) 5-10 MG tablet Take 1 tablet by mouth daily.    . DULoxetine (CYMBALTA) 60 MG capsule Take 1 capsule (60 mg total) by mouth daily. 30 capsule 2  . EPINEPHrine (EPI-PEN) 0.3 mg/0.3 mL SOAJ injection     . hydrOXYzine (ATARAX/VISTARIL) 25 MG tablet Take 1 tablet (25 mg total) by mouth daily as needed for anxiety. 30 tablet 2  . lisdexamfetamine (VYVANSE) 70 MG capsule Take 1 capsule (70 mg total) by mouth daily. 30 capsule 0  . lisdexamfetamine (VYVANSE) 70 MG capsule Take 1 capsule (70 mg total) by mouth daily. 30 capsule 0  . pantoprazole (PROTONIX) 40 MG tablet Take 40 mg by mouth 2 (two) times daily.     . traZODone (DESYREL) 100 MG tablet Take 2 tablets (200 mg total) by mouth at bedtime. 60 tablet 2   No current facility-administered medications for this visit.      Musculoskeletal: Strength & Muscle Tone: within normal limits Gait & Station: normal Patient leans: N/A  Psychiatric Specialty Exam: Review of Systems  Neurological: Positive for headaches.  All other systems reviewed and are negative.   Blood pressure 126/78, pulse 81, height 5\' 5"  (1.651 m), weight 204 lb (92.5 kg), SpO2 99 %.Body mass index is 33.95 kg/m.  General Appearance: Casual and Fairly Groomed  Eye Contact:  Good  Speech:  Clear and Coherent  Volume:  Normal  Mood:  Euthymic  Affect:  Congruent  Thought Process:  Goal Directed  Orientation:  Full (Time, Place, and Person)  Thought Content: WDL   Suicidal Thoughts:  No  Homicidal Thoughts:  No  Memory:  Immediate;   Good Recent;   Good Remote;   Good  Judgement:  Good  Insight:  Fair  Psychomotor Activity:  Normal  Concentration:  Concentration: Good and Attention Span: Good  Recall:  Good  Fund of Knowledge: Good  Language: Good  Akathisia:  No  Handed:  Right  AIMS (if indicated): not done  Assets:  Communication Skills Desire  for Improvement Physical Health Resilience Social Support Talents/Skills  ADL's:  Intact  Cognition: WNL  Sleep:  Good   Screenings:   Assessment and Plan:  This patient is a 57 year old female with a history of depression and ADHD.  She recently had some sort of odd spell starting with headache and ending with fainting.  She is being ruled out for seizure disorder but so far all of her tests have been normal.  Her brain  CT was negative by her report.  For now she will continue Vyvanse 70 mg every morning for ADD, trazodone 200 mg at bedtime for sleep and Cymbalta 60 mg daily for depression.  She will return to see me in 2 months  Levonne Spiller, MD 03/03/2018, 8:32 AM

## 2018-04-06 DIAGNOSIS — I951 Orthostatic hypotension: Secondary | ICD-10-CM | POA: Diagnosis not present

## 2018-04-06 DIAGNOSIS — I10 Essential (primary) hypertension: Secondary | ICD-10-CM | POA: Diagnosis not present

## 2018-04-06 DIAGNOSIS — G5711 Meralgia paresthetica, right lower limb: Secondary | ICD-10-CM | POA: Diagnosis not present

## 2018-04-24 ENCOUNTER — Other Ambulatory Visit (HOSPITAL_COMMUNITY): Payer: Self-pay | Admitting: Psychiatry

## 2018-05-01 DIAGNOSIS — R0789 Other chest pain: Secondary | ICD-10-CM | POA: Diagnosis not present

## 2018-05-01 DIAGNOSIS — R0602 Shortness of breath: Secondary | ICD-10-CM | POA: Diagnosis not present

## 2018-05-01 DIAGNOSIS — E669 Obesity, unspecified: Secondary | ICD-10-CM | POA: Diagnosis not present

## 2018-05-01 DIAGNOSIS — K210 Gastro-esophageal reflux disease with esophagitis: Secondary | ICD-10-CM | POA: Diagnosis not present

## 2018-05-05 ENCOUNTER — Ambulatory Visit (INDEPENDENT_AMBULATORY_CARE_PROVIDER_SITE_OTHER): Payer: BLUE CROSS/BLUE SHIELD | Admitting: Psychiatry

## 2018-05-05 ENCOUNTER — Encounter (HOSPITAL_COMMUNITY): Payer: Self-pay | Admitting: Psychiatry

## 2018-05-05 VITALS — BP 123/79 | HR 74 | Ht 65.0 in | Wt 208.0 lb

## 2018-05-05 DIAGNOSIS — F321 Major depressive disorder, single episode, moderate: Secondary | ICD-10-CM

## 2018-05-05 DIAGNOSIS — F900 Attention-deficit hyperactivity disorder, predominantly inattentive type: Secondary | ICD-10-CM

## 2018-05-05 MED ORDER — LISDEXAMFETAMINE DIMESYLATE 70 MG PO CAPS: 70.0000 mg | ORAL_CAPSULE | Freq: Every day | ORAL | 0 refills | Status: DC

## 2018-05-05 MED ORDER — DULOXETINE HCL 60 MG PO CPEP: 60.0000 mg | ORAL_CAPSULE | Freq: Every day | ORAL | 2 refills | Status: DC

## 2018-05-05 MED ORDER — HYDROXYZINE HCL 25 MG PO TABS: 25.0000 mg | ORAL_TABLET | Freq: Every day | ORAL | 2 refills | Status: DC | PRN

## 2018-05-05 MED ORDER — TRAZODONE HCL 100 MG PO TABS: 200.0000 mg | ORAL_TABLET | Freq: Every day | ORAL | 2 refills | Status: DC

## 2018-05-05 NOTE — Progress Notes (Signed)
[facility] MD/PA/NP OP Progress Note  05/05/2018 11:25 AM Laurie Reyes  MRN:  [id number]  Chief Complaint:  Chief Complaint    Depression; Anxiety; ADHD; Follow-up     HPI: This patient is a 57 year old married black female who lives with her husband in [place].  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  The patient returns after 2 months.  She had taken time off because work is gotten so stressful and she had also had a fall at home.  She still having leg pain from the fall.  Seizures have been ruled out.  She states that work is still extremely stressful but she has not been able to find anything else.  She said her 23 year old great niece would been living with her back to her sister in [place] because she could not handle the girl anymore.  She was very bilious and causing too much stress.  Overall the patient is sleeping well and so far handling the job stress but she realizes she cannot take this forever.  The medication for depression has been helpful and she denies suicidal ideation. Visit Diagnosis:    ICD-10-CM   1. Current moderate episode of major depressive disorder, unspecified whether recurrent ([place]) F32.1   2. Attention deficit hyperactivity disorder (ADHD), predominantly inattentive type F90.0     Past Psychiatric History: Previous outpatient treatment in our office  Past Medical History:  Past Medical History:  Diagnosis Date  . ADHD (attention deficit hyperactivity disorder)   . Gastric reflux   . Hot flashes, menopausal     Past Surgical History:  Procedure Laterality Date  . NASAL SINUS SURGERY    . SHOULDER SURGERY      Family Psychiatric History: See below  Family History:  Family History  Problem Relation Age of Onset  . Alcohol abuse Father   . ADD / ADHD Other   . ADD / ADHD Other     Social History:  Social History   Socioeconomic History  . Marital status: Married    Spouse  name: Not on file  . Number of children: Not on file  . Years of education: Not on file  . Highest education level: Not on file  Occupational History  . Not on file  Social Needs  . Financial resource strain: Not on file  . Food insecurity:    Worry: Not on file    Inability: Not on file  . Transportation needs:    Medical: Not on file    Non-medical: Not on file  Tobacco Use  . Smoking status: Former Research scientist (life sciences)  . Smokeless tobacco: Never Used  Substance and Sexual Activity  . Alcohol use: No  . Drug use: No  . Sexual activity: Yes  Lifestyle  . Physical activity:    Days per week: Not on file    Minutes per session: Not on file  . Stress: Not on file  Relationships  . Social connections:    Talks on phone: Not on file    Gets together: Not on file    Attends religious service: Not on file    Active member of club or organization: Not on file    Attends meetings of clubs or organizations: Not on file    Relationship status: Not on file  Other Topics Concern  . Not on file  Social History Narrative  . Not on file    Allergies:  Allergies  Allergen Reactions  .  Penicillins   . Shellfish Allergy   . Sulfa Antibiotics     Metabolic Disorder Labs: No results found for: HGBA1C, MPG No results found for: PROLACTIN No results found for: CHOL, TRIG, HDL, CHOLHDL, VLDL, LDLCALC No results found for: TSH  Therapeutic Level Labs: No results found for: LITHIUM No results found for: VALPROATE No components found for:  CBMZ  Current Medications: Current Outpatient Medications  Medication Sig Dispense Refill  . amLODipine-atorvastatin (CADUET) 5-10 MG tablet Take 1 tablet by mouth daily.    . DULoxetine (CYMBALTA) 60 MG capsule Take 1 capsule (60 mg total) by mouth daily. 30 capsule 2  . EPINEPHrine (EPI-PEN) 0.3 mg/0.3 mL SOAJ injection     . hydrOXYzine (ATARAX/VISTARIL) 25 MG tablet Take 1 tablet (25 mg total) by mouth daily as needed for anxiety. 30 tablet 2  .  lisdexamfetamine (VYVANSE) 70 MG capsule Take 1 capsule (70 mg total) by mouth daily. 30 capsule 0  . lisdexamfetamine (VYVANSE) 70 MG capsule Take 1 capsule (70 mg total) by mouth daily. 30 capsule 0  . pantoprazole (PROTONIX) 40 MG tablet Take 40 mg by mouth 2 (two) times daily.     . traZODone (DESYREL) 100 MG tablet Take 2 tablets (200 mg total) by mouth at bedtime. 60 tablet 2  . gabapentin (NEURONTIN) 300 MG capsule   1   No current facility-administered medications for this visit.      Musculoskeletal: Strength & Muscle Tone: within normal limits Gait & Station: normal Patient leans: N/A  Psychiatric Specialty Exam: Review of Systems  Musculoskeletal: Positive for back pain and joint pain.  Psychiatric/Behavioral: The patient is nervous/anxious.   All other systems reviewed and are negative.   Blood pressure 123/79, pulse 74, height 5\' 5"  (1.651 m), weight 208 lb (94.3 kg), SpO2 99 %.Body mass index is 34.61 kg/m.  General Appearance: Casual, Neat and Well Groomed  Eye Contact:  Good  Speech:  Clear and Coherent  Volume:  Normal  Mood:  Anxious  Affect:  Appropriate and Congruent  Thought Process:  Goal Directed  Orientation:  Full (Time, Place, and Person)  Thought Content: Rumination   Suicidal Thoughts:  No  Homicidal Thoughts:  No  Memory:  Immediate;   Good Recent;   Good Remote;   Good  Judgement:  Good  Insight:  Fair  Psychomotor Activity:  Decreased  Concentration:  Concentration: Good and Attention Span: Good  Recall:  Good  Fund of Knowledge: Good  Language: Good  Akathisia:  No  Handed:  Right  AIMS (if indicated): not done  Assets:  Communication Skills Desire for Improvement Resilience Social Support Talents/Skills Vocational/Educational  ADL's:  Intact  Cognition: WNL  Sleep:  Good   Screenings:   Assessment and Plan: Patient is a 57 year old female with a history of depression and anxiety and ADD.  She is doing the best she can to  handle her job stress.  She feels that the medications have been helpful.  She will continue trazodone 200 mg at bedtime for sleep, Vyvanse 70 mg every morning for focus, Cymbalta 60 mg daily for depression and hydroxyzine 25 mg daily as needed for anxiety.  She will return to see me in 2 months   Levonne Spiller, MD 05/05/2018, 11:25 AM

## 2018-07-07 ENCOUNTER — Encounter (HOSPITAL_COMMUNITY): Payer: Self-pay | Admitting: Psychiatry

## 2018-07-07 ENCOUNTER — Ambulatory Visit (HOSPITAL_COMMUNITY): Payer: BLUE CROSS/BLUE SHIELD | Admitting: Psychiatry

## 2018-07-07 ENCOUNTER — Ambulatory Visit (INDEPENDENT_AMBULATORY_CARE_PROVIDER_SITE_OTHER): Payer: BLUE CROSS/BLUE SHIELD | Admitting: Psychiatry

## 2018-07-07 VITALS — BP 112/74 | HR 77 | Ht 65.0 in | Wt 207.0 lb

## 2018-07-07 DIAGNOSIS — F900 Attention-deficit hyperactivity disorder, predominantly inattentive type: Secondary | ICD-10-CM

## 2018-07-07 DIAGNOSIS — F321 Major depressive disorder, single episode, moderate: Secondary | ICD-10-CM | POA: Diagnosis not present

## 2018-07-07 MED ORDER — HYDROXYZINE HCL 25 MG PO TABS: 25.0000 mg | ORAL_TABLET | Freq: Every day | ORAL | 2 refills | Status: DC | PRN

## 2018-07-07 MED ORDER — TRAZODONE HCL 100 MG PO TABS: 200.0000 mg | ORAL_TABLET | Freq: Every day | ORAL | 2 refills | Status: DC

## 2018-07-07 MED ORDER — LISDEXAMFETAMINE DIMESYLATE 70 MG PO CAPS: 70.0000 mg | ORAL_CAPSULE | Freq: Every day | ORAL | 0 refills | Status: DC

## 2018-07-07 MED ORDER — DULOXETINE HCL 60 MG PO CPEP: 60.0000 mg | ORAL_CAPSULE | Freq: Every day | ORAL | 2 refills | Status: DC

## 2018-07-07 NOTE — Progress Notes (Signed)
[facility] MD/PA/NP OP Progress Note  07/07/2018 11:01 AM Laurie Reyes  MRN:  [id number]  Chief Complaint:  Chief Complaint    Anxiety; Depression; ADD     HPI: This patient is a 58 year old married black female who lives with her husband in [place].  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  The patient returns after 2 months.  She states that she is doing much better because she is working in a new department in her office.  It is much less stressful and she likes it very much.  She is focusing well with the Vyvanse.  She still having trouble with sleep but she is having significant hot flashes and waking up drenched and cannot get back to sleep.  In the past she tried various hormones but the last one made her even more hyperactive.  I suggested she go back to OB/GYN to try something else and she agrees.  Her mood is much better and she is really having the panic attacks. Visit Diagnosis:    ICD-10-CM   1. Current moderate episode of major depressive disorder, unspecified whether recurrent ([place]) F32.1   2. Attention deficit hyperactivity disorder (ADHD), predominantly inattentive type F90.0     Past Psychiatric History: Previous outpatient treatment in our office  Past Medical History:  Past Medical History:  Diagnosis Date  . ADHD (attention deficit hyperactivity disorder)   . Gastric reflux   . Hot flashes, menopausal     Past Surgical History:  Procedure Laterality Date  . NASAL SINUS SURGERY    . SHOULDER SURGERY      Family Psychiatric History: See below  Family History:  Family History  Problem Relation Age of Onset  . Alcohol abuse Father   . ADD / ADHD Other   . ADD / ADHD Other     Social History:  Social History   Socioeconomic History  . Marital status: Married    Spouse name: Not on file  . Number of children: Not on file  . Years of education: Not on file  . Highest education level:  Not on file  Occupational History  . Not on file  Social Needs  . Financial resource strain: Not on file  . Food insecurity:    Worry: Not on file    Inability: Not on file  . Transportation needs:    Medical: Not on file    Non-medical: Not on file  Tobacco Use  . Smoking status: Former Research scientist (life sciences)  . Smokeless tobacco: Never Used  Substance and Sexual Activity  . Alcohol use: No  . Drug use: No  . Sexual activity: Yes  Lifestyle  . Physical activity:    Days per week: Not on file    Minutes per session: Not on file  . Stress: Not on file  Relationships  . Social connections:    Talks on phone: Not on file    Gets together: Not on file    Attends religious service: Not on file    Active member of club or organization: Not on file    Attends meetings of clubs or organizations: Not on file    Relationship status: Not on file  Other Topics Concern  . Not on file  Social History Narrative  . Not on file    Allergies:  Allergies  Allergen Reactions  . Penicillins   . Shellfish Allergy   . Sulfa Antibiotics     Metabolic Disorder  Labs: No results found for: HGBA1C, MPG No results found for: PROLACTIN No results found for: CHOL, TRIG, HDL, CHOLHDL, VLDL, LDLCALC No results found for: TSH  Therapeutic Level Labs: No results found for: LITHIUM No results found for: VALPROATE No components found for:  CBMZ  Current Medications: Current Outpatient Medications  Medication Sig Dispense Refill  . amLODipine-atorvastatin (CADUET) 5-10 MG tablet Take 1 tablet by mouth daily.    . DULoxetine (CYMBALTA) 60 MG capsule Take 1 capsule (60 mg total) by mouth daily. 30 capsule 2  . EPINEPHrine (EPI-PEN) 0.3 mg/0.3 mL SOAJ injection     . gabapentin (NEURONTIN) 300 MG capsule   1  . hydrOXYzine (ATARAX/VISTARIL) 25 MG tablet Take 1 tablet (25 mg total) by mouth daily as needed for anxiety. 30 tablet 2  . lisdexamfetamine (VYVANSE) 70 MG capsule Take 1 capsule (70 mg total) by  mouth daily. 30 capsule 0  . lisdexamfetamine (VYVANSE) 70 MG capsule Take 1 capsule (70 mg total) by mouth daily. 30 capsule 0  . pantoprazole (PROTONIX) 40 MG tablet Take 40 mg by mouth 2 (two) times daily.     . traZODone (DESYREL) 100 MG tablet Take 2 tablets (200 mg total) by mouth at bedtime. 60 tablet 2  . lisdexamfetamine (VYVANSE) 70 MG capsule Take 1 capsule (70 mg total) by mouth daily. 30 capsule 0   No current facility-administered medications for this visit.      Musculoskeletal: Strength & Muscle Tone: within normal limits Gait & Station: normal Patient leans: N/A  Psychiatric Specialty Exam: Review of Systems  Psychiatric/Behavioral: The patient has insomnia.   All other systems reviewed and are negative.   Blood pressure 112/74, pulse 77, height 5\' 5"  (1.651 m), weight 207 lb (93.9 kg), SpO2 100 %.Body mass index is 34.45 kg/m.  General Appearance: Casual and Fairly Groomed  Eye Contact:  Good  Speech:  Clear and Coherent  Volume:  Normal  Mood:  Euthymic  Affect:  Congruent  Thought Process:  Goal Directed  Orientation:  Full (Time, Place, and Person)  Thought Content: WDL   Suicidal Thoughts:  No  Homicidal Thoughts:  No  Memory:  Immediate;   Good Recent;   Good Remote;   Good  Judgement:  Good  Insight:  Fair  Psychomotor Activity:  Normal  Concentration:  Concentration: Good and Attention Span: Good  Recall:  Good  Fund of Knowledge: Good  Language: Good  Akathisia:  No  Handed:  Right  AIMS (if indicated): not done  Assets:  Communication Skills Desire for Improvement Physical Health Resilience Social Support Talents/Skills  ADL's:  Intact  Cognition: WNL  Sleep:  Fair   Screenings:   Assessment and Plan: Patient is a 58 year old female with a history of depression anxiety and ADD.  She is doing much better now that her job is changed and she is under less stress.  She is having insomnia related to hot flashes at night strongly  suggested she discuss this with her OB/GYN and try another hormonal preparation.  She will continue Cymbalta 60 mg daily for depression, trazodone 200 mg at bedtime for sleep and hydroxyzine 25 mg daily as needed for anxiety.  She will also continue Vyvanse 70 mg every morning for ADD.  She will return to see me in 3 months   Levonne Spiller, MD 07/07/2018, 11:01 AM

## 2018-07-25 DIAGNOSIS — N859 Noninflammatory disorder of uterus, unspecified: Secondary | ICD-10-CM | POA: Diagnosis not present

## 2018-07-25 DIAGNOSIS — R9389 Abnormal findings on diagnostic imaging of other specified body structures: Secondary | ICD-10-CM | POA: Diagnosis not present

## 2018-07-25 DIAGNOSIS — N950 Postmenopausal bleeding: Secondary | ICD-10-CM | POA: Diagnosis not present

## 2018-07-25 DIAGNOSIS — N644 Mastodynia: Secondary | ICD-10-CM | POA: Diagnosis not present

## 2018-07-25 DIAGNOSIS — R3989 Other symptoms and signs involving the genitourinary system: Secondary | ICD-10-CM | POA: Diagnosis not present

## 2018-08-11 DIAGNOSIS — Z0001 Encounter for general adult medical examination with abnormal findings: Secondary | ICD-10-CM | POA: Diagnosis not present

## 2018-08-11 DIAGNOSIS — Z0000 Encounter for general adult medical examination without abnormal findings: Secondary | ICD-10-CM | POA: Diagnosis not present

## 2018-08-11 DIAGNOSIS — E559 Vitamin D deficiency, unspecified: Secondary | ICD-10-CM | POA: Diagnosis not present

## 2018-08-11 DIAGNOSIS — R300 Dysuria: Secondary | ICD-10-CM | POA: Diagnosis not present

## 2018-08-11 DIAGNOSIS — E785 Hyperlipidemia, unspecified: Secondary | ICD-10-CM | POA: Diagnosis not present

## 2018-08-22 DIAGNOSIS — I10 Essential (primary) hypertension: Secondary | ICD-10-CM | POA: Diagnosis not present

## 2018-08-22 DIAGNOSIS — Z01419 Encounter for gynecological examination (general) (routine) without abnormal findings: Secondary | ICD-10-CM | POA: Diagnosis not present

## 2018-08-22 DIAGNOSIS — Z6833 Body mass index (BMI) 33.0-33.9, adult: Secondary | ICD-10-CM | POA: Diagnosis not present

## 2018-09-22 ENCOUNTER — Other Ambulatory Visit (HOSPITAL_COMMUNITY): Payer: Self-pay | Admitting: Psychiatry

## 2018-10-06 ENCOUNTER — Ambulatory Visit (INDEPENDENT_AMBULATORY_CARE_PROVIDER_SITE_OTHER): Payer: BLUE CROSS/BLUE SHIELD | Admitting: Psychiatry

## 2018-10-06 ENCOUNTER — Encounter (HOSPITAL_COMMUNITY): Payer: Self-pay | Admitting: Psychiatry

## 2018-10-06 ENCOUNTER — Other Ambulatory Visit: Payer: Self-pay

## 2018-10-06 DIAGNOSIS — F900 Attention-deficit hyperactivity disorder, predominantly inattentive type: Secondary | ICD-10-CM | POA: Diagnosis not present

## 2018-10-06 DIAGNOSIS — Z79899 Other long term (current) drug therapy: Secondary | ICD-10-CM

## 2018-10-06 DIAGNOSIS — F321 Major depressive disorder, single episode, moderate: Secondary | ICD-10-CM | POA: Diagnosis not present

## 2018-10-06 MED ORDER — LISDEXAMFETAMINE DIMESYLATE 70 MG PO CAPS: 70.0000 mg | ORAL_CAPSULE | Freq: Every day | ORAL | 0 refills | Status: DC

## 2018-10-06 MED ORDER — TRAZODONE HCL 100 MG PO TABS: 200.0000 mg | ORAL_TABLET | Freq: Every day | ORAL | 4 refills | Status: DC

## 2018-10-06 MED ORDER — DULOXETINE HCL 60 MG PO CPEP: ORAL_CAPSULE | ORAL | 2 refills | Status: DC

## 2018-10-06 NOTE — Progress Notes (Signed)
[facility] MD/PA/NP OP Progress Note  10/06/2018 8:52 AM Laurie Reyes  MRN:  [id number]  Chief Complaint:  Chief Complaint    ADD; Depression; Follow-up     Virtual Visit via Telephone Note  I connected with Laurie Reyes on 10/06/18 at  8:20 AM EDT by telephone and verified that I am speaking with the correct person using two identifiers.   I discussed the limitations, risks, security and privacy concerns of performing an evaluation and management service by telephone and the availability of in person appointments. I also discussed with the patient that there may be a patient responsible charge related to this service. The patient expressed understanding and agreed to proceed.      I discussed the assessment and treatment plan with the patient. The patient was provided an opportunity to ask questions and all were answered. The patient agreed with the plan and demonstrated an understanding of the instructions.   The patient was advised to call back or seek an in-person evaluation if the symptoms worsen or if the condition fails to improve as anticipated.  I provided 15 minutes of non-face-to-face time during this encounter.   Levonne Spiller, MD This patient is a 58 year old married black female who lives with her husband in [place].  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  The patient returns for follow-up after 3 months.  She is assessed via telephone because of the coronavirus pandemic.  She states that she is doing quite well.  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  She is enjoying her job although she is working part-time from home.  Her great niece is moved back in at least temporarily and they are getting along well.  She is sleeping well now her mood is good she denies significant symptoms of depression and/or anxiety.  She feels that her medications  have been quite helpful.  Her focus continues to be good on Vyvanse  Visit Diagnosis:    ICD-10-CM   1. Current moderate episode of major depressive disorder, unspecified whether recurrent ([place]) F32.1   2. Attention deficit hyperactivity disorder (ADHD), predominantly inattentive type F90.0     Past Psychiatric History: Previous outpatient treatment in our office  Past Medical History:  Past Medical History:  Diagnosis Date  . ADHD (attention deficit hyperactivity disorder)   . Gastric reflux   . Hot flashes, menopausal     Past Surgical History:  Procedure Laterality Date  . NASAL SINUS SURGERY    . SHOULDER SURGERY      Family Psychiatric History: see below  Family History:  Family History  Problem Relation Age of Onset  . Alcohol abuse Father   . ADD / ADHD Other   . ADD / ADHD Other     Social History:  Social History   Socioeconomic History  . Marital status: Married    Spouse name: Not on file  . Number of children: Not on file  . Years of education: Not on file  . Highest education level: Not on file  Occupational History  . Not on file  Social Needs  . Financial resource strain: Not on file  . Food insecurity:    Worry: Not on file    Inability: Not on file  . Transportation needs:    Medical: Not on file    Non-medical: Not on file  Tobacco Use  . Smoking  status: Former Research scientist (life sciences)  . Smokeless tobacco: Never Used  Substance and Sexual Activity  . Alcohol use: No  . Drug use: No  . Sexual activity: Yes  Lifestyle  . Physical activity:    Days per week: Not on file    Minutes per session: Not on file  . Stress: Not on file  Relationships  . Social connections:    Talks on phone: Not on file    Gets together: Not on file    Attends religious service: Not on file    Active member of club or organization: Not on file    Attends meetings of clubs or organizations: Not on file    Relationship status: Not on file  Other Topics Concern  . Not on  file  Social History Narrative  . Not on file    Allergies:  Allergies  Allergen Reactions  . Penicillins   . Shellfish Allergy   . Sulfa Antibiotics     Metabolic Disorder Labs: No results found for: HGBA1C, MPG No results found for: PROLACTIN No results found for: CHOL, TRIG, HDL, CHOLHDL, VLDL, LDLCALC No results found for: TSH  Therapeutic Level Labs: No results found for: LITHIUM No results found for: VALPROATE No components found for:  CBMZ  Current Medications: Current Outpatient Medications  Medication Sig Dispense Refill  . amLODipine-atorvastatin (CADUET) 5-10 MG tablet Take 1 tablet by mouth daily.    . DULoxetine (CYMBALTA) 60 MG capsule TAKE (1) CAPSULE BY MOUTH ONCE DAILY. 30 capsule 2  . EPINEPHrine (EPI-PEN) 0.3 mg/0.3 mL SOAJ injection     . gabapentin (NEURONTIN) 300 MG capsule   1  . hydrOXYzine (ATARAX/VISTARIL) 25 MG tablet Take 1 tablet (25 mg total) by mouth daily as needed for anxiety. 30 tablet 2  . lisdexamfetamine (VYVANSE) 70 MG capsule Take 1 capsule (70 mg total) by mouth daily. 30 capsule 0  . lisdexamfetamine (VYVANSE) 70 MG capsule Take 1 capsule (70 mg total) by mouth daily. 30 capsule 0  . lisdexamfetamine (VYVANSE) 70 MG capsule Take 1 capsule (70 mg total) by mouth daily. 30 capsule 0  . pantoprazole (PROTONIX) 40 MG tablet Take 40 mg by mouth 2 (two) times daily.     . traZODone (DESYREL) 100 MG tablet Take 2 tablets (200 mg total) by mouth at bedtime. 60 tablet 4   No current facility-administered medications for this visit.      Musculoskeletal: Strength & Muscle Tone: Not assessed, phone visit Gait & Station:  Patient leans:   Psychiatric Specialty Exam: Review of Systems  All other systems reviewed and are negative.   There were no vitals taken for this visit.There is no height or weight on file to calculate BMI.  General Appearance: n/a  Eye Contact:  NA  Speech:  Clear and Coherent  Volume:  Normal  Mood:  Euthymic   Affect:  Appropriate and Congruent  Thought Process:  Goal Directed  Orientation:  Full (Time, Place, and Person)  Thought Content: WDL   Suicidal Thoughts:  No  Homicidal Thoughts:  No  Memory:  Immediate;   Good Recent;   Good Remote;   Good  Judgement:  Good  Insight:  Fair  Psychomotor Activity:  Normal  Concentration:  Concentration: Good and Attention Span: Good  Recall:  Good  Fund of Knowledge: Good  Language: Good  Akathisia:  No  Handed:  Right  AIMS (if indicated): not done  Assets:  Communication Skills Desire for Improvement Physical Health Resilience Social  Support Talents/Skills Vocational/Educational  ADL's:  Intact  Cognition: WNL  Sleep:  Good   Screenings:   Assessment and Plan:  This patient is a 58 year old female with a history of depression and ADD.  She is doing well on her current regimen.  She will continue Cymbalta 60 mg daily for depression, trazodone 200 mg at bedtime for sleep and Vyvanse 70 mg every morning for focus.  She will return to see me in 3 months  Levonne Spiller, MD 10/06/2018, 8:52 AM

## 2018-11-27 ENCOUNTER — Other Ambulatory Visit (HOSPITAL_COMMUNITY): Payer: Self-pay | Admitting: Internal Medicine

## 2018-11-27 DIAGNOSIS — Z1231 Encounter for screening mammogram for malignant neoplasm of breast: Secondary | ICD-10-CM

## 2018-12-05 DIAGNOSIS — K210 Gastro-esophageal reflux disease with esophagitis: Secondary | ICD-10-CM | POA: Diagnosis not present

## 2018-12-05 DIAGNOSIS — R1011 Right upper quadrant pain: Secondary | ICD-10-CM | POA: Diagnosis not present

## 2018-12-05 DIAGNOSIS — K582 Mixed irritable bowel syndrome: Secondary | ICD-10-CM | POA: Diagnosis not present

## 2018-12-08 ENCOUNTER — Other Ambulatory Visit: Payer: Self-pay

## 2018-12-08 ENCOUNTER — Ambulatory Visit (HOSPITAL_COMMUNITY)
Admission: RE | Admit: 2018-12-08 | Discharge: 2018-12-08 | Disposition: A | Discharge status: Home / Self Care | Payer: BC Managed Care – PPO | Source: Ambulatory Visit | Attending: Internal Medicine | Admitting: Internal Medicine

## 2018-12-08 DIAGNOSIS — Z1231 Encounter for screening mammogram for malignant neoplasm of breast: Secondary | ICD-10-CM | POA: Insufficient documentation

## 2018-12-11 ENCOUNTER — Other Ambulatory Visit (HOSPITAL_COMMUNITY): Payer: Self-pay | Admitting: Psychiatry

## 2018-12-29 DIAGNOSIS — K582 Mixed irritable bowel syndrome: Secondary | ICD-10-CM | POA: Diagnosis not present

## 2018-12-29 DIAGNOSIS — K210 Gastro-esophageal reflux disease with esophagitis: Secondary | ICD-10-CM | POA: Diagnosis not present

## 2019-01-05 ENCOUNTER — Ambulatory Visit (INDEPENDENT_AMBULATORY_CARE_PROVIDER_SITE_OTHER): Payer: BC Managed Care – PPO | Admitting: Psychiatry

## 2019-01-05 ENCOUNTER — Other Ambulatory Visit: Payer: Self-pay

## 2019-01-05 ENCOUNTER — Encounter (HOSPITAL_COMMUNITY): Payer: Self-pay | Admitting: Psychiatry

## 2019-01-05 DIAGNOSIS — F321 Major depressive disorder, single episode, moderate: Secondary | ICD-10-CM

## 2019-01-05 DIAGNOSIS — F900 Attention-deficit hyperactivity disorder, predominantly inattentive type: Secondary | ICD-10-CM | POA: Diagnosis not present

## 2019-01-05 MED ORDER — LISDEXAMFETAMINE DIMESYLATE 70 MG PO CAPS: 70.0000 mg | ORAL_CAPSULE | Freq: Every day | ORAL | 0 refills | Status: DC

## 2019-01-05 MED ORDER — TRAZODONE HCL 100 MG PO TABS: 200.0000 mg | ORAL_TABLET | Freq: Every day | ORAL | 4 refills | Status: DC

## 2019-01-05 MED ORDER — DULOXETINE HCL 60 MG PO CPEP: 60.0000 mg | ORAL_CAPSULE | Freq: Every day | ORAL | 2 refills | Status: DC

## 2019-01-05 NOTE — Progress Notes (Signed)
Virtual Visit via Telephone Note  I connected with Laurie Reyes on 01/05/19 at  8:40 AM EDT by telephone and verified that I am speaking with the correct person using two identifiers.   I discussed the limitations, risks, security and privacy concerns of performing an evaluation and management service by telephone and the availability of in person appointments. I also discussed with the patient that there may be a patient responsible charge related to this service. The patient expressed understanding and agreed to proceed.       I discussed the assessment and treatment plan with the patient. The patient was provided an opportunity to ask questions and all were answered. The patient agreed with the plan and demonstrated an understanding of the instructions.   The patient was advised to call back or seek an in-person evaluation if the symptoms worsen or if the condition fails to improve as anticipated.  I provided 15 minutes of non-face-to-face time during this encounter.   Laurie Spiller, MD  [facility] MD/PA/NP OP Progress Note  01/05/2019 8:57 AM Laurie Reyes  MRN:  [id number]  Chief Complaint:  Chief Complaint    Depression; Anxiety; Follow-up     HPI: This patient is a 58 year old married black female who lives with her husband in [place].  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  The patient returns after 3 months.  She states that she is doing quite well.  She is still working from home most the time to the coronavirus pandemic.  She likes it and finds it more relaxing although she misses her colleagues.  She is getting along well with her family.  She is sleeping well and her energy is fairly good.  She did have some symptoms of irritable bowel syndrome but this is improved with Linzess.  She denies serious symptoms of depression anxiety or suicidal ideation.  Her focus continues to be good on the Vyvanse. Visit  Diagnosis:    ICD-10-CM   1. Current moderate episode of major depressive disorder, unspecified whether recurrent ([place])  F32.1   2. Attention deficit hyperactivity disorder (ADHD), predominantly inattentive type  F90.0     Past Psychiatric History: Previous outpatient treatment in our office  Past Medical History:  Past Medical History:  Diagnosis Date  . ADHD (attention deficit hyperactivity disorder)   . Gastric reflux   . Hot flashes, menopausal     Past Surgical History:  Procedure Laterality Date  . NASAL SINUS SURGERY    . SHOULDER SURGERY      Family Psychiatric History: see below  Family History:  Family History  Problem Relation Age of Onset  . Alcohol abuse Father   . ADD / ADHD Other   . ADD / ADHD Other     Social History:  Social History   Socioeconomic History  . Marital status: Married    Spouse name: Not on file  . Number of children: Not on file  . Years of education: Not on file  . Highest education level: Not on file  Occupational History  . Not on file  Social Needs  . Financial resource strain: Not on file  . Food insecurity    Worry: Not on file    Inability: Not on file  . Transportation needs    Medical: Not on file    Non-medical: Not on file  Tobacco Use  . Smoking status: Former Research scientist (life sciences)  . Smokeless tobacco: Never Used  Substance and  Sexual Activity  . Alcohol use: No  . Drug use: No  . Sexual activity: Yes  Lifestyle  . Physical activity    Days per week: Not on file    Minutes per session: Not on file  . Stress: Not on file  Relationships  . Social Herbalist on phone: Not on file    Gets together: Not on file    Attends religious service: Not on file    Active member of club or organization: Not on file    Attends meetings of clubs or organizations: Not on file    Relationship status: Not on file  Other Topics Concern  . Not on file  Social History Narrative  . Not on file    Allergies:  Allergies   Allergen Reactions  . Penicillins   . Shellfish Allergy   . Sulfa Antibiotics     Metabolic Disorder Labs: No results found for: HGBA1C, MPG No results found for: PROLACTIN No results found for: CHOL, TRIG, HDL, CHOLHDL, VLDL, LDLCALC No results found for: TSH  Therapeutic Level Labs: No results found for: LITHIUM No results found for: VALPROATE No components found for:  CBMZ  Current Medications: Current Outpatient Medications  Medication Sig Dispense Refill  . amLODipine-atorvastatin (CADUET) 5-10 MG tablet Take 1 tablet by mouth daily.    . DULoxetine (CYMBALTA) 60 MG capsule Take 1 capsule (60 mg total) by mouth daily. 30 capsule 2  . EPINEPHrine (EPI-PEN) 0.3 mg/0.3 mL SOAJ injection     . gabapentin (NEURONTIN) 300 MG capsule   1  . hydrOXYzine (ATARAX/VISTARIL) 25 MG tablet Take 1 tablet (25 mg total) by mouth daily as needed for anxiety. 30 tablet 2  . LINZESS 72 MCG capsule     . lisdexamfetamine (VYVANSE) 70 MG capsule Take 1 capsule (70 mg total) by mouth daily. 30 capsule 0  . lisdexamfetamine (VYVANSE) 70 MG capsule Take 1 capsule (70 mg total) by mouth daily. 30 capsule 0  . lisdexamfetamine (VYVANSE) 70 MG capsule Take 1 capsule (70 mg total) by mouth daily. 30 capsule 0  . pantoprazole (PROTONIX) 40 MG tablet Take 40 mg by mouth 2 (two) times daily.     . traZODone (DESYREL) 100 MG tablet Take 2 tablets (200 mg total) by mouth at bedtime. 60 tablet 4   No current facility-administered medications for this visit.      Musculoskeletal: Strength & Muscle Tone: within normal limits Gait & Station: normal Patient leans: N/A  Psychiatric Specialty Exam: Review of Systems  All other systems reviewed and are negative.   There were no vitals taken for this visit.There is no height or weight on file to calculate BMI.  General Appearance: NA  Eye Contact:  NA  Speech:  Clear and Coherent  Volume:  Normal  Mood:  Euthymic  Affect:  NA  Thought Process:  Goal  Directed  Orientation:  Full (Time, Place, and Person)  Thought Content: WDL   Suicidal Thoughts:  No  Homicidal Thoughts:  No  Memory:  Immediate;   Good Recent;   Good Remote;   Good  Judgement:  Good  Insight:  Fair  Psychomotor Activity:  Normal  Concentration:  Concentration: Good and Attention Span: Good  Recall:  Good  Fund of Knowledge: Good  Language: Good  Akathisia:  No  Handed:  Right  AIMS (if indicated): not done  Assets:  Communication Skills Desire for Improvement Physical Health Resilience Social Support Talents/Skills  ADL's:  Intact  Cognition: WNL  Sleep:  Good   Screenings:   Assessment and Plan: This patient is a 58 year old female with a history of depression ADD and insomnia.  She is doing very well on her current regimen.  She will continue Cymbalta 60 mg daily for depression, Vyvanse 70 mg every morning for focus and trazodone 100 to 200 mg at bedtime for sleep.  She will return to see me in 3 months   Laurie Spiller, MD 01/05/2019, 8:57 AM

## 2019-01-18 ENCOUNTER — Other Ambulatory Visit: Payer: Self-pay

## 2019-01-18 DIAGNOSIS — R6889 Other general symptoms and signs: Secondary | ICD-10-CM | POA: Diagnosis not present

## 2019-01-18 DIAGNOSIS — Z6836 Body mass index (BMI) 36.0-36.9, adult: Secondary | ICD-10-CM | POA: Diagnosis not present

## 2019-01-18 DIAGNOSIS — E7849 Other hyperlipidemia: Secondary | ICD-10-CM | POA: Diagnosis not present

## 2019-01-18 DIAGNOSIS — Z20822 Contact with and (suspected) exposure to covid-19: Secondary | ICD-10-CM

## 2019-01-18 DIAGNOSIS — K219 Gastro-esophageal reflux disease without esophagitis: Secondary | ICD-10-CM | POA: Diagnosis not present

## 2019-01-18 DIAGNOSIS — N182 Chronic kidney disease, stage 2 (mild): Secondary | ICD-10-CM | POA: Diagnosis not present

## 2019-01-21 LAB — NOVEL CORONAVIRUS, NAA: SARS-CoV-2, NAA: NOT DETECTED

## 2019-03-30 ENCOUNTER — Other Ambulatory Visit (HOSPITAL_COMMUNITY): Payer: Self-pay | Admitting: Psychiatry

## 2019-04-09 DIAGNOSIS — K581 Irritable bowel syndrome with constipation: Secondary | ICD-10-CM | POA: Diagnosis not present

## 2019-04-09 DIAGNOSIS — R1031 Right lower quadrant pain: Secondary | ICD-10-CM | POA: Diagnosis not present

## 2019-04-10 ENCOUNTER — Encounter (HOSPITAL_COMMUNITY): Payer: Self-pay | Admitting: Psychiatry

## 2019-04-10 ENCOUNTER — Other Ambulatory Visit: Payer: Self-pay

## 2019-04-10 ENCOUNTER — Ambulatory Visit (INDEPENDENT_AMBULATORY_CARE_PROVIDER_SITE_OTHER): Payer: BC Managed Care – PPO | Admitting: Psychiatry

## 2019-04-10 DIAGNOSIS — F321 Major depressive disorder, single episode, moderate: Secondary | ICD-10-CM

## 2019-04-10 DIAGNOSIS — F900 Attention-deficit hyperactivity disorder, predominantly inattentive type: Secondary | ICD-10-CM | POA: Diagnosis not present

## 2019-04-10 MED ORDER — DULOXETINE HCL 60 MG PO CPEP: ORAL_CAPSULE | ORAL | 2 refills | Status: DC

## 2019-04-10 MED ORDER — HYDROXYZINE HCL 25 MG PO TABS: 25.0000 mg | ORAL_TABLET | Freq: Every day | ORAL | 2 refills | Status: DC | PRN

## 2019-04-10 MED ORDER — LISDEXAMFETAMINE DIMESYLATE 70 MG PO CAPS: 70.0000 mg | ORAL_CAPSULE | Freq: Every day | ORAL | 0 refills | Status: DC

## 2019-04-10 MED ORDER — TRAZODONE HCL 100 MG PO TABS: 200.0000 mg | ORAL_TABLET | Freq: Every day | ORAL | 4 refills | Status: DC

## 2019-04-10 NOTE — Progress Notes (Signed)
Virtual Visit via Video Note  I connected with Laurie Reyes on 04/10/19 at  9:00 AM EDT by a video enabled telemedicine application and verified that I am speaking with the correct person using two identifiers.   I discussed the limitations of evaluation and management by telemedicine and the availability of in person appointments. The patient expressed understanding and agreed to proceed.     I discussed the assessment and treatment plan with the patient. The patient was provided an opportunity to ask questions and all were answered. The patient agreed with the plan and demonstrated an understanding of the instructions.   The patient was advised to call back or seek an in-person evaluation if the symptoms worsen or if the condition fails to improve as anticipated.  I provided 15 minutes of non-face-to-face time during this encounter.   Levonne Spiller, MD  [facility] MD/PA/NP OP Progress Note  04/10/2019 9:13 AM [name]  MRN:  [id number]  Chief Complaint:  Chief Complaint    ADD; Depression; Follow-up     HPI: This patient is a 58 year old married black female who lives with her husband in [place].  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  The patient returns after 3 months.  She states that for the most part she is doing well.  She continues to work at home due to the coronavirus pandemic.  She states it is okay but she misses interactions with coworkers.  Her great niece is still staying with her and she worries a lot about her and we discussed this at length today.  However the patient denies being seriously depressed and her focus is good.  She is sleeping well at night denies any thoughts of self-harm or suicidal ideation. Visit Diagnosis:    ICD-10-CM   1. Current moderate episode of major depressive disorder, unspecified whether recurrent ([place])  F32.1   2. Attention deficit hyperactivity disorder (ADHD),  predominantly inattentive type  F90.0     Past Psychiatric History: Previous outpatient treatment in our office  Past Medical History:  Past Medical History:  Diagnosis Date  . ADHD (attention deficit hyperactivity disorder)   . Gastric reflux   . Hot flashes, menopausal     Past Surgical History:  Procedure Laterality Date  . NASAL SINUS SURGERY    . SHOULDER SURGERY      Family Psychiatric History: see below  Family History:  Family History  Problem Relation Age of Onset  . Alcohol abuse Father   . ADD / ADHD Other   . ADD / ADHD Other     Social History:  Social History   Socioeconomic History  . Marital status: Married    Spouse name: Not on file  . Number of children: Not on file  . Years of education: Not on file  . Highest education level: Not on file  Occupational History  . Not on file  Social Needs  . Financial resource strain: Not on file  . Food insecurity    Worry: Not on file    Inability: Not on file  . Transportation needs    Medical: Not on file    Non-medical: Not on file  Tobacco Use  . Smoking status: Former Research scientist (life sciences)  . Smokeless tobacco: Never Used  Substance and Sexual Activity  . Alcohol use: No  . Drug use: No  . Sexual activity: Yes  Lifestyle  . Physical activity    Days per week: Not on  file    Minutes per session: Not on file  . Stress: Not on file  Relationships  . Social Herbalist on phone: Not on file    Gets together: Not on file    Attends religious service: Not on file    Active member of club or organization: Not on file    Attends meetings of clubs or organizations: Not on file    Relationship status: Not on file  Other Topics Concern  . Not on file  Social History Narrative  . Not on file    Allergies:  Allergies  Allergen Reactions  . Penicillins   . Shellfish Allergy   . Sulfa Antibiotics     Metabolic Disorder Labs: No results found for: HGBA1C, MPG No results found for: PROLACTIN No  results found for: CHOL, TRIG, HDL, CHOLHDL, VLDL, LDLCALC No results found for: TSH  Therapeutic Level Labs: No results found for: LITHIUM No results found for: VALPROATE No components found for:  CBMZ  Current Medications: Current Outpatient Medications  Medication Sig Dispense Refill  . amLODipine-atorvastatin (CADUET) 5-10 MG tablet Take 1 tablet by mouth daily.    . DULoxetine (CYMBALTA) 60 MG capsule TAKE (1) CAPSULE BY MOUTH ONCE DAILY. 30 capsule 2  . EPINEPHrine (EPI-PEN) 0.3 mg/0.3 mL SOAJ injection     . gabapentin (NEURONTIN) 300 MG capsule   1  . hydrOXYzine (ATARAX/VISTARIL) 25 MG tablet Take 1 tablet (25 mg total) by mouth daily as needed for anxiety. 30 tablet 2  . LINZESS 72 MCG capsule     . lisdexamfetamine (VYVANSE) 70 MG capsule Take 1 capsule (70 mg total) by mouth daily. 30 capsule 0  . lisdexamfetamine (VYVANSE) 70 MG capsule Take 1 capsule (70 mg total) by mouth daily. 30 capsule 0  . lisdexamfetamine (VYVANSE) 70 MG capsule Take 1 capsule (70 mg total) by mouth daily. 30 capsule 0  . pantoprazole (PROTONIX) 40 MG tablet Take 40 mg by mouth 2 (two) times daily.     . traZODone (DESYREL) 100 MG tablet Take 2 tablets (200 mg total) by mouth at bedtime. 60 tablet 4   No current facility-administered medications for this visit.      Musculoskeletal: Strength & Muscle Tone: within normal limits Gait & Station: normal Patient leans: N/A  Psychiatric Specialty Exam: Review of Systems  Gastrointestinal: Positive for abdominal pain, constipation and diarrhea.  All other systems reviewed and are negative.   There were no vitals taken for this visit.There is no height or weight on file to calculate BMI.  General Appearance: NA  Eye Contact:  NA  Speech:  Clear and Coherent  Volume:  Normal  Mood:  Euthymic  Affect:  NA  Thought Process:  Goal Directed  Orientation:  Full (Time, Place, and Person)  Thought Content: WDL   Suicidal Thoughts:  No  Homicidal  Thoughts:  No  Memory:  Immediate;   Good Recent;   Good Remote;   Good  Judgement:  Good  Insight:  Good  Psychomotor Activity:  Normal  Concentration:  Concentration: Good and Attention Span: Good  Recall:  Good  Fund of Knowledge: Good  Language: Good  Akathisia:  No  Handed:  Right  AIMS (if indicated): not done  Assets:  Communication Skills Desire for Improvement Resilience Social Support Talents/Skills  ADL's:  Intact  Cognition: WNL  Sleep:  Good   Screenings:   Assessment and Plan: This patient is a 58 year old female with a history  of depression ADD and insomnia.  She continues to do well on her current regimen.  She will continue Cymbalta 60 mg daily for depression, Vyvanse 70 mg every morning for focus and trazodone 100 to 200 mg at bedtime for sleep.  She will return to see me in 3 months   Levonne Spiller, MD 04/10/2019, 9:13 AM

## 2019-04-12 ENCOUNTER — Other Ambulatory Visit: Payer: Self-pay | Admitting: Gastroenterology

## 2019-04-13 ENCOUNTER — Other Ambulatory Visit: Payer: Self-pay | Admitting: Gastroenterology

## 2019-04-13 ENCOUNTER — Ambulatory Visit (HOSPITAL_COMMUNITY): Payer: BC Managed Care – PPO | Admitting: Psychiatry

## 2019-04-13 ENCOUNTER — Other Ambulatory Visit (HOSPITAL_COMMUNITY): Payer: Self-pay | Admitting: Gastroenterology

## 2019-04-13 DIAGNOSIS — R1031 Right lower quadrant pain: Secondary | ICD-10-CM

## 2019-04-13 DIAGNOSIS — K581 Irritable bowel syndrome with constipation: Secondary | ICD-10-CM

## 2019-04-20 ENCOUNTER — Ambulatory Visit (HOSPITAL_COMMUNITY): Payer: BC Managed Care – PPO

## 2019-04-27 ENCOUNTER — Ambulatory Visit (HOSPITAL_COMMUNITY): Payer: BC Managed Care – PPO

## 2019-05-03 ENCOUNTER — Encounter (HOSPITAL_COMMUNITY): Payer: Self-pay

## 2019-05-03 ENCOUNTER — Ambulatory Visit (HOSPITAL_COMMUNITY)
Admission: RE | Admit: 2019-05-03 | Discharge: 2019-05-03 | Disposition: A | Discharge status: Home / Self Care | Payer: BC Managed Care – PPO | Source: Ambulatory Visit | Attending: Gastroenterology | Admitting: Gastroenterology

## 2019-05-03 ENCOUNTER — Other Ambulatory Visit: Payer: Self-pay

## 2019-05-03 DIAGNOSIS — D259 Leiomyoma of uterus, unspecified: Secondary | ICD-10-CM | POA: Diagnosis not present

## 2019-05-03 DIAGNOSIS — R1031 Right lower quadrant pain: Secondary | ICD-10-CM | POA: Insufficient documentation

## 2019-05-03 DIAGNOSIS — K581 Irritable bowel syndrome with constipation: Secondary | ICD-10-CM | POA: Diagnosis not present

## 2019-05-16 DIAGNOSIS — D219 Benign neoplasm of connective and other soft tissue, unspecified: Secondary | ICD-10-CM | POA: Diagnosis not present

## 2019-05-16 DIAGNOSIS — R1031 Right lower quadrant pain: Secondary | ICD-10-CM | POA: Diagnosis not present

## 2019-06-28 ENCOUNTER — Other Ambulatory Visit (HOSPITAL_COMMUNITY): Payer: Self-pay | Admitting: Psychiatry

## 2019-07-11 ENCOUNTER — Encounter (HOSPITAL_COMMUNITY): Payer: Self-pay | Admitting: Psychiatry

## 2019-07-11 ENCOUNTER — Other Ambulatory Visit: Payer: Self-pay

## 2019-07-11 ENCOUNTER — Ambulatory Visit (INDEPENDENT_AMBULATORY_CARE_PROVIDER_SITE_OTHER): Payer: BC Managed Care – PPO | Admitting: Psychiatry

## 2019-07-11 DIAGNOSIS — F321 Major depressive disorder, single episode, moderate: Secondary | ICD-10-CM | POA: Diagnosis not present

## 2019-07-11 DIAGNOSIS — F900 Attention-deficit hyperactivity disorder, predominantly inattentive type: Secondary | ICD-10-CM

## 2019-07-11 MED ORDER — LISDEXAMFETAMINE DIMESYLATE 70 MG PO CAPS: 70.0000 mg | ORAL_CAPSULE | Freq: Every day | ORAL | 0 refills | Status: DC

## 2019-07-11 MED ORDER — HYDROXYZINE HCL 25 MG PO TABS: 25.0000 mg | ORAL_TABLET | Freq: Every day | ORAL | 2 refills | Status: DC | PRN

## 2019-07-11 MED ORDER — DULOXETINE HCL 60 MG PO CPEP: 60.0000 mg | ORAL_CAPSULE | Freq: Every day | ORAL | 2 refills | Status: DC

## 2019-07-11 MED ORDER — TRAZODONE HCL 100 MG PO TABS: 200.0000 mg | ORAL_TABLET | Freq: Every day | ORAL | 4 refills | Status: DC

## 2019-07-11 NOTE — Progress Notes (Signed)
[facility] MD/PA/NP OP Progress Note  07/11/2019 9:18 AM Laurie Reyes  MRN:  [id number]  Chief Complaint:  Chief Complaint    Depression; ADD; Follow-up     HPI: This patient is a 59 year old married black female who lives with her husband in [place].  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  The patient returns for follow-up after 3 months.  She states that she has been more stressed lately.  She continues to work from home and she finds this very isolating.  Also numerous members of her extended family have gotten coronavirus and many of them are children and this is gotten her worried.  Her great niece still lives with her and she thinks the girl is gotten very depressed and withdrawn.  All of these things are distracting her and making it harder to focus.  She does think the Vyvanse is doing as well as could be expected given all the other issues in her life.  She denies being seriously depressed or suicidal.  The Vistaril continues to help her anxiety.  Her sleep is interrupted by hot flashes and she is going to make an appointment with an OB/GYN to address this. Visit Diagnosis:    ICD-10-CM   1. Current moderate episode of major depressive disorder, unspecified whether recurrent ([place])  F32.1   2. Attention deficit hyperactivity disorder (ADHD), predominantly inattentive type  F90.0     Past Psychiatric History: Previous outpatient treatment in our office  Past Medical History:  Past Medical History:  Diagnosis Date  . ADHD (attention deficit hyperactivity disorder)   . Gastric reflux   . Hot flashes, menopausal     Past Surgical History:  Procedure Laterality Date  . NASAL SINUS SURGERY    . SHOULDER SURGERY      Family Psychiatric History: see below  Family History:  Family History  Problem Relation Age of Onset  . ADD / ADHD Other   . ADD / ADHD Other   . Alcohol abuse Father     Social History:  Social  History   Socioeconomic History  . Marital status: Married    Spouse name: Not on file  . Number of children: Not on file  . Years of education: Not on file  . Highest education level: Not on file  Occupational History  . Not on file  Tobacco Use  . Smoking status: Former Research scientist (life sciences)  . Smokeless tobacco: Never Used  Substance and Sexual Activity  . Alcohol use: No  . Drug use: No  . Sexual activity: Yes  Other Topics Concern  . Not on file  Social History Narrative  . Not on file   Social Determinants of Health   Financial Resource Strain:   . Difficulty of Paying Living Expenses: Not on file  Food Insecurity:   . Worried About Charity fundraiser in the Last Year: Not on file  . Ran Out of Food in the Last Year: Not on file  Transportation Needs:   . Lack of Transportation (Medical): Not on file  . Lack of Transportation (Non-Medical): Not on file  Physical Activity:   . Days of Exercise per Week: Not on file  . Minutes of Exercise per Session: Not on file  Stress:   . Feeling of Stress : Not on file  Social Connections:   . Frequency of Communication with Friends and Family: Not on file  . Frequency of Social Gatherings with Friends and  Family: Not on file  . Attends Religious Services: Not on file  . Active Member of Clubs or Organizations: Not on file  . Attends Archivist Meetings: Not on file  . Marital Status: Not on file    Allergies:  Allergies  Allergen Reactions  . Penicillins   . Shellfish Allergy   . Sulfa Antibiotics     Metabolic Disorder Labs: No results found for: HGBA1C, MPG No results found for: PROLACTIN No results found for: CHOL, TRIG, HDL, CHOLHDL, VLDL, LDLCALC No results found for: TSH  Therapeutic Level Labs: No results found for: LITHIUM No results found for: VALPROATE No components found for:  CBMZ  Current Medications: Current Outpatient Medications  Medication Sig Dispense Refill  . amLODipine-atorvastatin  (CADUET) 5-10 MG tablet Take 1 tablet by mouth daily.    . DULoxetine (CYMBALTA) 60 MG capsule Take 1 capsule (60 mg total) by mouth daily. 30 capsule 2  . EPINEPHrine (EPI-PEN) 0.3 mg/0.3 mL SOAJ injection     . gabapentin (NEURONTIN) 300 MG capsule   1  . hydrOXYzine (ATARAX/VISTARIL) 25 MG tablet Take 1 tablet (25 mg total) by mouth daily as needed for anxiety. 30 tablet 2  . LINZESS 72 MCG capsule     . lisdexamfetamine (VYVANSE) 70 MG capsule Take 1 capsule (70 mg total) by mouth daily. 30 capsule 0  . lisdexamfetamine (VYVANSE) 70 MG capsule Take 1 capsule (70 mg total) by mouth daily. 30 capsule 0  . lisdexamfetamine (VYVANSE) 70 MG capsule Take 1 capsule (70 mg total) by mouth daily. 30 capsule 0  . pantoprazole (PROTONIX) 40 MG tablet Take 40 mg by mouth 2 (two) times daily.     . traZODone (DESYREL) 100 MG tablet Take 2 tablets (200 mg total) by mouth at bedtime. 60 tablet 4   No current facility-administered medications for this visit.     Musculoskeletal: Strength & Muscle Tone: within normal limits Gait & Station: normal Patient leans: N/A  Psychiatric Specialty Exam: Review of Systems  Psychiatric/Behavioral: Positive for sleep disturbance. The patient is nervous/anxious.   All other systems reviewed and are negative.   There were no vitals taken for this visit.There is no height or weight on file to calculate BMI.  General Appearance: NA  Eye Contact:  NA  Speech:  Clear and Coherent  Volume:  Normal  Mood:  Anxious  Affect:  NA  Thought Process:  Goal Directed  Orientation:  Full (Time, Place, and Person)  Thought Content: Rumination   Suicidal Thoughts:  No  Homicidal Thoughts:  No  Memory:  Immediate;   Good Recent;   Good Remote;   Good  Judgement:  Good  Insight:  Good  Psychomotor Activity:  Normal  Concentration:  Concentration: Fair and Attention Span: Fair  Recall:  Good  Fund of Knowledge: Good  Language: Good  Akathisia:  No  Handed:  Right   AIMS (if indicated): not done  Assets:  Communication Skills Desire for Improvement Physical Health Resilience Social Support Talents/Skills  ADL's:  Intact  Cognition: WNL  Sleep:  Fair   Screenings:   Assessment and Plan: This patient is a 59 year old female with a history of depression, ADD and insomnia.  She continues to do okay on her current regimen but she does seem more stressed.  She agrees to restart therapy in our office with Maurice Small.  She will continue Cymbalta 60 mg daily for depression, Vyvanse 70 mg every morning for focus hydroxyzine 25 mg  daily as needed for anxiety and trazodone 100 to 200 mg at bedtime for sleep.  She will return to see me in 2 months.   Levonne Spiller, MD 07/11/2019, 9:18 AM

## 2019-07-20 ENCOUNTER — Other Ambulatory Visit: Payer: Self-pay

## 2019-07-20 ENCOUNTER — Ambulatory Visit (HOSPITAL_COMMUNITY): Payer: BC Managed Care – PPO | Admitting: Psychiatry

## 2019-07-20 ENCOUNTER — Telehealth (HOSPITAL_COMMUNITY): Payer: Self-pay | Admitting: Psychiatry

## 2019-07-20 NOTE — Telephone Encounter (Signed)
Therapist called patient for scheduled appointment, received voicemail message.  Therapist left message indicating attempt to contact and requesting patient call office.

## 2019-08-30 DIAGNOSIS — R0602 Shortness of breath: Secondary | ICD-10-CM | POA: Diagnosis not present

## 2019-08-30 DIAGNOSIS — I10 Essential (primary) hypertension: Secondary | ICD-10-CM | POA: Diagnosis not present

## 2019-08-30 DIAGNOSIS — R7309 Other abnormal glucose: Secondary | ICD-10-CM | POA: Diagnosis not present

## 2019-08-30 DIAGNOSIS — E669 Obesity, unspecified: Secondary | ICD-10-CM | POA: Diagnosis not present

## 2019-09-03 DIAGNOSIS — Z6836 Body mass index (BMI) 36.0-36.9, adult: Secondary | ICD-10-CM | POA: Diagnosis not present

## 2019-09-03 DIAGNOSIS — Z0001 Encounter for general adult medical examination with abnormal findings: Secondary | ICD-10-CM | POA: Diagnosis not present

## 2019-09-03 DIAGNOSIS — E7849 Other hyperlipidemia: Secondary | ICD-10-CM | POA: Diagnosis not present

## 2019-09-03 DIAGNOSIS — K219 Gastro-esophageal reflux disease without esophagitis: Secondary | ICD-10-CM | POA: Diagnosis not present

## 2019-09-10 ENCOUNTER — Ambulatory Visit (HOSPITAL_COMMUNITY): Payer: BC Managed Care – PPO | Admitting: Psychiatry

## 2019-09-12 ENCOUNTER — Telehealth (HOSPITAL_COMMUNITY): Payer: Self-pay | Admitting: Psychiatry

## 2019-09-12 ENCOUNTER — Ambulatory Visit (HOSPITAL_COMMUNITY): Payer: BC Managed Care – PPO | Admitting: Psychiatry

## 2019-09-12 ENCOUNTER — Other Ambulatory Visit: Payer: Self-pay

## 2019-09-26 ENCOUNTER — Ambulatory Visit (INDEPENDENT_AMBULATORY_CARE_PROVIDER_SITE_OTHER): Payer: BC Managed Care – PPO | Admitting: Psychiatry

## 2019-09-26 ENCOUNTER — Other Ambulatory Visit: Payer: Self-pay

## 2019-09-26 ENCOUNTER — Encounter (HOSPITAL_COMMUNITY): Payer: Self-pay | Admitting: Psychiatry

## 2019-09-26 DIAGNOSIS — F900 Attention-deficit hyperactivity disorder, predominantly inattentive type: Secondary | ICD-10-CM

## 2019-09-26 DIAGNOSIS — F321 Major depressive disorder, single episode, moderate: Secondary | ICD-10-CM

## 2019-09-26 MED ORDER — HYDROXYZINE HCL 25 MG PO TABS: 25.0000 mg | ORAL_TABLET | Freq: Every day | ORAL | 2 refills | Status: DC | PRN

## 2019-09-26 MED ORDER — LISDEXAMFETAMINE DIMESYLATE 70 MG PO CAPS: 70.0000 mg | ORAL_CAPSULE | Freq: Every day | ORAL | 0 refills | Status: DC

## 2019-09-26 MED ORDER — DULOXETINE HCL 60 MG PO CPEP: 60.0000 mg | ORAL_CAPSULE | Freq: Every day | ORAL | 2 refills | Status: DC

## 2019-09-26 MED ORDER — TRAZODONE HCL 100 MG PO TABS: 200.0000 mg | ORAL_TABLET | Freq: Every day | ORAL | 4 refills | Status: DC

## 2019-09-26 NOTE — Progress Notes (Signed)
Virtual Visit via Telephone Note  I connected with Laurie Reyes on 09/26/19 at  1:20 PM EDT by telephone and verified that I am speaking with the correct person using two identifiers.   I discussed the limitations, risks, security and privacy concerns of performing an evaluation and management service by telephone and the availability of in person appointments. I also discussed with the patient that there may be a patient responsible charge related to this service. The patient expressed understanding and agreed to proceed.    I discussed the assessment and treatment plan with the patient. The patient was provided an opportunity to ask questions and all were answered. The patient agreed with the plan and demonstrated an understanding of the instructions.   The patient was advised to call back or seek an in-person evaluation if the symptoms worsen or if the condition fails to improve as anticipated.  I provided 15 minutes of non-face-to-face time during this encounter.   Levonne Spiller, MD  [facility] MD/PA/NP OP Progress Note  09/26/2019 1:45 PM Laurie Reyes  MRN:  [id number]  Chief Complaint:  Chief Complaint    Depression; Anxiety; ADD; Follow-up     HPI: This patient is a 59 year old married black female lives with her husband in [place].  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  The patient returns after 3 months.  She continues to feel very stressed.  She is working from home and has been told that she and other coworkers will be expected to work from home indefinitely.  She states that the broadband in her area is very poor and she is constantly being cut off while she is trying to do her job and she finds this very stressful.  She is asked her workplace about finding a hot spot but they have declined this.  She states that she gets so stressed that she reverses numbers in her reports.  She is going to be seeing a new  neurologist just to make sure nothing is being missed.  She still thinks the Vyvanse is doing fairly well for her focus.  At times she feels depressed and worried but at other times she feels pretty good.  For the most part she is sleeping okay and the Vistaril continues to help her anxiety.  She denies suicidal ideation Visit Diagnosis:    ICD-10-CM   1. Current moderate episode of major depressive disorder, unspecified whether recurrent ([place])  F32.1   2. Attention deficit hyperactivity disorder (ADHD), predominantly inattentive type  F90.0     Past Psychiatric History: Previous outpatient treatment in our office  Past Medical History:  Past Medical History:  Diagnosis Date  . ADHD (attention deficit hyperactivity disorder)   . Gastric reflux   . Hot flashes, menopausal     Past Surgical History:  Procedure Laterality Date  . NASAL SINUS SURGERY    . SHOULDER SURGERY      Family Psychiatric History: see below  Family History:  Family History  Problem Relation Age of Onset  . ADD / ADHD Other   . ADD / ADHD Other   . Alcohol abuse Father     Social History:  Social History   Socioeconomic History  . Marital status: Married    Spouse name: Not on file  . Number of children: Not on file  . Years of education: Not on file  . Highest education level: Not on file  Occupational History  . Not on  file  Tobacco Use  . Smoking status: Former Research scientist (life sciences)  . Smokeless tobacco: Never Used  Substance and Sexual Activity  . Alcohol use: No  . Drug use: No  . Sexual activity: Yes  Other Topics Concern  . Not on file  Social History Narrative  . Not on file   Social Determinants of Health   Financial Resource Strain:   . Difficulty of Paying Living Expenses:   Food Insecurity:   . Worried About Charity fundraiser in the Last Year:   . Arboriculturist in the Last Year:   Transportation Needs:   . Film/video editor (Medical):   Marland Kitchen Lack of Transportation (Non-Medical):    Physical Activity:   . Days of Exercise per Week:   . Minutes of Exercise per Session:   Stress:   . Feeling of Stress :   Social Connections:   . Frequency of Communication with Friends and Family:   . Frequency of Social Gatherings with Friends and Family:   . Attends Religious Services:   . Active Member of Clubs or Organizations:   . Attends Archivist Meetings:   Marland Kitchen Marital Status:     Allergies:  Allergies  Allergen Reactions  . Penicillins   . Shellfish Allergy   . Sulfa Antibiotics     Metabolic Disorder Labs: No results found for: HGBA1C, MPG No results found for: PROLACTIN No results found for: CHOL, TRIG, HDL, CHOLHDL, VLDL, LDLCALC No results found for: TSH  Therapeutic Level Labs: No results found for: LITHIUM No results found for: VALPROATE No components found for:  CBMZ  Current Medications: Current Outpatient Medications  Medication Sig Dispense Refill  . amLODipine-atorvastatin (CADUET) 5-10 MG tablet Take 1 tablet by mouth daily.    . DULoxetine (CYMBALTA) 60 MG capsule Take 1 capsule (60 mg total) by mouth daily. 30 capsule 2  . EPINEPHrine (EPI-PEN) 0.3 mg/0.3 mL SOAJ injection     . gabapentin (NEURONTIN) 300 MG capsule   1  . hydrOXYzine (ATARAX/VISTARIL) 25 MG tablet Take 1 tablet (25 mg total) by mouth daily as needed for anxiety. 30 tablet 2  . LINZESS 72 MCG capsule     . lisdexamfetamine (VYVANSE) 70 MG capsule Take 1 capsule (70 mg total) by mouth daily. 30 capsule 0  . lisdexamfetamine (VYVANSE) 70 MG capsule Take 1 capsule (70 mg total) by mouth daily. 30 capsule 0  . lisdexamfetamine (VYVANSE) 70 MG capsule Take 1 capsule (70 mg total) by mouth daily. 30 capsule 0  . pantoprazole (PROTONIX) 40 MG tablet Take 40 mg by mouth 2 (two) times daily.     . traZODone (DESYREL) 100 MG tablet Take 2 tablets (200 mg total) by mouth at bedtime. 60 tablet 4   No current facility-administered medications for this visit.      Musculoskeletal: Strength & Muscle Tone: within normal limits Gait & Station: normal Patient leans: N/A  Psychiatric Specialty Exam: Review of Systems  Musculoskeletal: Positive for arthralgias.  Psychiatric/Behavioral: The patient is nervous/anxious.   All other systems reviewed and are negative.   There were no vitals taken for this visit.There is no height or weight on file to calculate BMI.  General Appearance: NA  Eye Contact:  NA  Speech:  Clear and Coherent  Volume:  Normal  Mood:  Euthymic  Affect:  NA  Thought Process:  Goal Directed  Orientation:  Full (Time, Place, and Person)  Thought Content: Rumination   Suicidal Thoughts:  No  Homicidal Thoughts:  No  Memory:  Immediate;   Good Recent;   Good Remote;   Good  Judgement:  Good  Insight:  Fair  Psychomotor Activity:  Normal  Concentration:  Concentration: Fair and Attention Span: Fair  Recall:  Good  Fund of Knowledge: Good  Language: Good  Akathisia:  No  Handed:  Right  AIMS (if indicated): not done  Assets:  Communication Skills Desire for Improvement Physical Health Resilience Social Support Talents/Skills  ADL's:  Intact  Cognition: WNL  Sleep:  Good   Screenings:   Assessment and Plan: This patient is a 59 year old female with a history of depression ADD and insomnia.  She is more stressed regarding her work and seems to be having more cognitive issues.  She is going to see neurology about this.  However her mood is under good control so she will continue Cymbalta 60 mg daily for depression Vyvanse 70 mg every morning for focus, hydroxyzine 25 mg daily as needed for anxiety and trazodone 100 to 200 mg at bedtime for sleep.  She will return to see me in 3 months   Levonne Spiller, MD 09/26/2019, 1:45 PM

## 2019-10-09 ENCOUNTER — Ambulatory Visit (HOSPITAL_COMMUNITY)
Admission: RE | Admit: 2019-10-09 | Discharge: 2019-10-09 | Disposition: A | Discharge status: Home / Self Care | Payer: BC Managed Care – PPO | Source: Ambulatory Visit | Attending: Neurology | Admitting: Neurology

## 2019-10-09 ENCOUNTER — Encounter (HOSPITAL_COMMUNITY): Payer: Self-pay

## 2019-10-09 ENCOUNTER — Other Ambulatory Visit (HOSPITAL_COMMUNITY): Payer: Self-pay | Admitting: Internal Medicine

## 2019-10-09 ENCOUNTER — Other Ambulatory Visit (HOSPITAL_COMMUNITY): Payer: Self-pay | Admitting: Neurology

## 2019-10-09 ENCOUNTER — Other Ambulatory Visit: Payer: Self-pay

## 2019-10-09 DIAGNOSIS — M545 Low back pain, unspecified: Secondary | ICD-10-CM

## 2019-10-09 DIAGNOSIS — Z1231 Encounter for screening mammogram for malignant neoplasm of breast: Secondary | ICD-10-CM

## 2019-10-09 DIAGNOSIS — G5711 Meralgia paresthetica, right lower limb: Secondary | ICD-10-CM | POA: Diagnosis not present

## 2019-10-09 DIAGNOSIS — I951 Orthostatic hypotension: Secondary | ICD-10-CM | POA: Diagnosis not present

## 2019-10-09 DIAGNOSIS — R569 Unspecified convulsions: Secondary | ICD-10-CM | POA: Diagnosis not present

## 2019-11-22 ENCOUNTER — Other Ambulatory Visit: Payer: Self-pay | Admitting: Neurology

## 2019-11-22 DIAGNOSIS — M5416 Radiculopathy, lumbar region: Secondary | ICD-10-CM

## 2019-11-22 DIAGNOSIS — R569 Unspecified convulsions: Secondary | ICD-10-CM | POA: Diagnosis not present

## 2019-11-22 DIAGNOSIS — G5711 Meralgia paresthetica, right lower limb: Secondary | ICD-10-CM | POA: Diagnosis not present

## 2019-11-22 DIAGNOSIS — M545 Low back pain: Secondary | ICD-10-CM | POA: Diagnosis not present

## 2019-12-01 ENCOUNTER — Other Ambulatory Visit (HOSPITAL_COMMUNITY): Payer: Self-pay | Admitting: Psychiatry

## 2019-12-01 ENCOUNTER — Other Ambulatory Visit: Payer: BC Managed Care – PPO

## 2019-12-03 ENCOUNTER — Encounter (HOSPITAL_COMMUNITY): Payer: Self-pay | Admitting: *Deleted

## 2019-12-03 NOTE — Telephone Encounter (Signed)
Call for appt

## 2019-12-03 NOTE — Telephone Encounter (Signed)
Unable to reach patient. Message sent to patient mychart.

## 2019-12-14 ENCOUNTER — Ambulatory Visit (HOSPITAL_COMMUNITY)
Admission: RE | Admit: 2019-12-14 | Discharge: 2019-12-14 | Disposition: A | Discharge status: Home / Self Care | Payer: BC Managed Care – PPO | Source: Ambulatory Visit | Attending: Internal Medicine | Admitting: Internal Medicine

## 2019-12-14 ENCOUNTER — Other Ambulatory Visit: Payer: Self-pay

## 2019-12-14 DIAGNOSIS — Z1231 Encounter for screening mammogram for malignant neoplasm of breast: Secondary | ICD-10-CM | POA: Insufficient documentation

## 2019-12-18 ENCOUNTER — Other Ambulatory Visit (HOSPITAL_COMMUNITY): Payer: Self-pay | Admitting: Internal Medicine

## 2019-12-25 ENCOUNTER — Other Ambulatory Visit (HOSPITAL_COMMUNITY): Payer: Self-pay | Admitting: Internal Medicine

## 2019-12-25 DIAGNOSIS — R928 Other abnormal and inconclusive findings on diagnostic imaging of breast: Secondary | ICD-10-CM

## 2019-12-27 DIAGNOSIS — G5711 Meralgia paresthetica, right lower limb: Secondary | ICD-10-CM | POA: Diagnosis not present

## 2019-12-27 DIAGNOSIS — R569 Unspecified convulsions: Secondary | ICD-10-CM | POA: Diagnosis not present

## 2019-12-27 DIAGNOSIS — M545 Low back pain: Secondary | ICD-10-CM | POA: Diagnosis not present

## 2019-12-27 DIAGNOSIS — G4733 Obstructive sleep apnea (adult) (pediatric): Secondary | ICD-10-CM | POA: Diagnosis not present

## 2020-01-08 ENCOUNTER — Ambulatory Visit (HOSPITAL_COMMUNITY)
Admission: RE | Admit: 2020-01-08 | Discharge: 2020-01-08 | Disposition: A | Discharge status: Home / Self Care | Payer: BC Managed Care – PPO | Source: Ambulatory Visit | Attending: Internal Medicine | Admitting: Internal Medicine

## 2020-01-08 ENCOUNTER — Other Ambulatory Visit: Payer: Self-pay

## 2020-01-08 DIAGNOSIS — R928 Other abnormal and inconclusive findings on diagnostic imaging of breast: Secondary | ICD-10-CM | POA: Diagnosis not present

## 2020-01-08 DIAGNOSIS — N6012 Diffuse cystic mastopathy of left breast: Secondary | ICD-10-CM | POA: Diagnosis not present

## 2020-01-31 ENCOUNTER — Other Ambulatory Visit (HOSPITAL_COMMUNITY): Payer: Self-pay | Admitting: Psychiatry

## 2020-02-04 NOTE — Telephone Encounter (Signed)
Call again for appt

## 2020-02-11 ENCOUNTER — Telehealth (INDEPENDENT_AMBULATORY_CARE_PROVIDER_SITE_OTHER): Payer: BC Managed Care – PPO | Admitting: Psychiatry

## 2020-02-11 ENCOUNTER — Other Ambulatory Visit: Payer: Self-pay

## 2020-02-11 ENCOUNTER — Encounter (HOSPITAL_COMMUNITY): Payer: Self-pay | Admitting: Psychiatry

## 2020-02-11 DIAGNOSIS — F900 Attention-deficit hyperactivity disorder, predominantly inattentive type: Secondary | ICD-10-CM | POA: Diagnosis not present

## 2020-02-11 DIAGNOSIS — F321 Major depressive disorder, single episode, moderate: Secondary | ICD-10-CM

## 2020-02-11 MED ORDER — LISDEXAMFETAMINE DIMESYLATE 70 MG PO CAPS: 70.0000 mg | ORAL_CAPSULE | Freq: Every day | ORAL | 0 refills | Status: DC

## 2020-02-11 MED ORDER — TRAZODONE HCL 100 MG PO TABS: 200.0000 mg | ORAL_TABLET | Freq: Every day | ORAL | 2 refills | Status: DC

## 2020-02-11 MED ORDER — DULOXETINE HCL 60 MG PO CPEP: 60.0000 mg | ORAL_CAPSULE | Freq: Two times a day (BID) | ORAL | 2 refills | Status: DC

## 2020-02-11 MED ORDER — HYDROXYZINE HCL 25 MG PO TABS: ORAL_TABLET | ORAL | 2 refills | Status: DC

## 2020-02-11 NOTE — Progress Notes (Signed)
Virtual Visit via Telephone Note  I connected with Laurie Reyes on 02/11/20 at  9:40 AM EDT by telephone and verified that I am speaking with the correct person using two identifiers.   I discussed the limitations, risks, security and privacy concerns of performing an evaluation and management service by telephone and the availability of in person appointments. I also discussed with the patient that there may be a patient responsible charge related to this service. The patient expressed understanding and agreed to proceed.   I discussed the assessment and treatment plan with the patient. The patient was provided an opportunity to ask questions and all were answered. The patient agreed with the plan and demonstrated an understanding of the instructions.   The patient was advised to call back or seek an in-person evaluation if the symptoms worsen or if the condition fails to improve as anticipated.  I provided 15 minutes of non-face-to-face time during this encounter. Location: Provider office, patient home  Laurie Spiller, MD  [facility] MD/PA/NP OP Progress Note  02/11/2020 10:01 AM Laurie Reyes  MRN:  [id number]  Chief Complaint:  Chief Complaint    Depression; Anxiety; ADD; Follow-up     DSK:AJGO patient is a 59 year old married black female lives with her husband in [place].  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  The patient returns for follow-up after about 4 months.  She states that she has been going to see highly neurology because she has pain going down her leg.  She has been going to physical therapy for this.  She still finds her job very stressful.  She is working from home and often the broadband is not working and she has disconnection issues.  She states that she is going to be transferred to the [facility] office and perhaps things will get better in terms of the computer support.  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  She states that she has been very stressed regarding her job and often gets overwhelmed when the technology does not work and she is behind.  Given that she feels depressed and is also undergoing chronic pain I suggest that we increase her Cymbalta and she agrees.  She is generally functioning okay and denies suicidal ideation and the hydroxyzine continues to help her anxiety.  Vyvanse continues to help with her focus Visit Diagnosis:    ICD-10-CM   1. Current moderate episode of major depressive disorder, unspecified whether recurrent ([place])  F32.1   2. Attention deficit hyperactivity disorder (ADHD), predominantly inattentive type  F90.0     Past Psychiatric History: Previous outpatient treatment in our office  Past Medical History:  Past Medical History:  Diagnosis Date  . ADHD (attention deficit hyperactivity disorder)   . Gastric reflux   . Hot flashes, menopausal     Past Surgical History:  Procedure Laterality Date  . NASAL SINUS SURGERY    . SHOULDER SURGERY      Family Psychiatric History: see below  Family History:  Family History  Problem Relation Age of Onset  . ADD / ADHD Other   . ADD / ADHD Other   . Alcohol abuse Father     Social History:  Social History   Socioeconomic History  . Marital status: Married    Spouse name: Not on file  . Number of children: Not on file  . Years of education: Not on file  .  Highest education level: Not on file  Occupational History  . Not on file  Tobacco Use  . Smoking status: Former Research scientist (life sciences)  . Smokeless tobacco: Never Used  Substance and Sexual Activity  . Alcohol use: No  . Drug use: No  . Sexual activity: Yes  Other Topics Concern  . Not on file  Social History Narrative  . Not on file   Social Determinants of Health   Financial Resource Strain:   . Difficulty of Paying Living Expenses: Not on file  Food Insecurity:   . Worried About Charity fundraiser in the Last  Year: Not on file  . Ran Out of Food in the Last Year: Not on file  Transportation Needs:   . Lack of Transportation (Medical): Not on file  . Lack of Transportation (Non-Medical): Not on file  Physical Activity:   . Days of Exercise per Week: Not on file  . Minutes of Exercise per Session: Not on file  Stress:   . Feeling of Stress : Not on file  Social Connections:   . Frequency of Communication with Friends and Family: Not on file  . Frequency of Social Gatherings with Friends and Family: Not on file  . Attends Religious Services: Not on file  . Active Member of Clubs or Organizations: Not on file  . Attends Archivist Meetings: Not on file  . Marital Status: Not on file    Allergies:  Allergies  Allergen Reactions  . Penicillins   . Shellfish Allergy   . Sulfa Antibiotics     Metabolic Disorder Labs: No results found for: HGBA1C, MPG No results found for: PROLACTIN No results found for: CHOL, TRIG, HDL, CHOLHDL, VLDL, LDLCALC No results found for: TSH  Therapeutic Level Labs: No results found for: LITHIUM No results found for: VALPROATE No components found for:  CBMZ  Current Medications: Current Outpatient Medications  Medication Sig Dispense Refill  . ergocalciferol (VITAMIN D2) 1.25 MG (50000 UT) capsule Vitamin D2 1,250 mcg (50,000 unit) capsule  Take 1 capsule every month by oral route.    Marland Kitchen amLODipine (NORVASC) 5 MG tablet Take 5 mg by mouth daily.    Marland Kitchen amLODipine-atorvastatin (CADUET) 5-10 MG tablet Take 1 tablet by mouth daily.    . baclofen (LIORESAL) 10 MG tablet baclofen 10 mg tablet    . DULoxetine (CYMBALTA) 60 MG capsule Take 1 capsule (60 mg total) by mouth 2 (two) times daily. 60 capsule 2  . EPINEPHrine (EPI-PEN) 0.3 mg/0.3 mL SOAJ injection     . gabapentin (NEURONTIN) 300 MG capsule   1  . hydrOXYzine (ATARAX/VISTARIL) 25 MG tablet TAKE (1) TABLET BY MOUTH ONCE DAILY AS NEEDED FOR ANXIETY. 30 tablet 2  . LINZESS 72 MCG capsule     .  lisdexamfetamine (VYVANSE) 70 MG capsule Take 1 capsule (70 mg total) by mouth daily. 30 capsule 0  . lisdexamfetamine (VYVANSE) 70 MG capsule Take 1 capsule (70 mg total) by mouth daily. 30 capsule 0  . lisdexamfetamine (VYVANSE) 70 MG capsule Take 1 capsule (70 mg total) by mouth daily. 30 capsule 0  . pantoprazole (PROTONIX) 40 MG tablet Take 40 mg by mouth 2 (two) times daily.     . traMADol (ULTRAM) 50 MG tablet tramadol 50 mg tablet  Take 2 tablets twice a day by oral route as needed.  Take 1-2 twice a day as needed    . traZODone (DESYREL) 100 MG tablet Take 2 tablets (200 mg total) by  mouth at bedtime. 60 tablet 2   No current facility-administered medications for this visit.     Musculoskeletal: Strength & Muscle Tone: within normal limits Gait & Station: normal Patient leans: N/A  Psychiatric Specialty Exam: Review of Systems  Musculoskeletal: Positive for back pain and myalgias.  Psychiatric/Behavioral: Positive for dysphoric mood. The patient is nervous/anxious.   All other systems reviewed and are negative.   There were no vitals taken for this visit.There is no height or weight on file to calculate BMI.  General Appearance: NA  Eye Contact:  NA  Speech:  Clear and Coherent  Volume:  Normal  Mood:  Anxious and Dysphoric  Affect:  NA  Thought Process:  Goal Directed  Orientation:  Full (Time, Place, and Person)  Thought Content: Rumination   Suicidal Thoughts:  No  Homicidal Thoughts:  No  Memory:  Immediate;   Good Recent;   Good Remote;   Good  Judgement:  Good  Insight:  Fair  Psychomotor Activity:  Decreased  Concentration:  Concentration: Good and Attention Span: Good  Recall:  Good  Fund of Knowledge: Good  Language: Good  Akathisia:  No  Handed:  Right  AIMS (if indicated): not done  Assets:  Communication Skills Desire for Improvement Physical Health Resilience Social Support Talents/Skills Vocational/Educational  ADL's:  Intact  Cognition:  WNL  Sleep:  Good   Screenings:   Assessment and Plan: This patient is a 59 year old female with a history of depression ADD and insomnia.  She is having more stress related to her job as well as chronic pain so we will increase Cymbalta to 60 mg twice daily.  She will continue Vyvanse 70 mg every morning for focus, hydroxyzine 25 mg daily as needed for anxiety and trazodone 100 to 200 mg at bedtime for sleep.  She will return to see me in 3 months   Laurie Spiller, MD 02/11/2020, 10:01 AM

## 2020-02-21 DIAGNOSIS — G5711 Meralgia paresthetica, right lower limb: Secondary | ICD-10-CM | POA: Diagnosis not present

## 2020-02-21 DIAGNOSIS — G4733 Obstructive sleep apnea (adult) (pediatric): Secondary | ICD-10-CM | POA: Diagnosis not present

## 2020-02-21 DIAGNOSIS — M5416 Radiculopathy, lumbar region: Secondary | ICD-10-CM | POA: Diagnosis not present

## 2020-02-21 DIAGNOSIS — M545 Low back pain: Secondary | ICD-10-CM | POA: Diagnosis not present

## 2020-03-13 DIAGNOSIS — K581 Irritable bowel syndrome with constipation: Secondary | ICD-10-CM | POA: Diagnosis not present

## 2020-03-13 DIAGNOSIS — K2100 Gastro-esophageal reflux disease with esophagitis, without bleeding: Secondary | ICD-10-CM | POA: Diagnosis not present

## 2020-03-13 DIAGNOSIS — K625 Hemorrhage of anus and rectum: Secondary | ICD-10-CM | POA: Diagnosis not present

## 2020-04-04 DIAGNOSIS — I959 Hypotension, unspecified: Secondary | ICD-10-CM | POA: Diagnosis not present

## 2020-04-04 DIAGNOSIS — G4733 Obstructive sleep apnea (adult) (pediatric): Secondary | ICD-10-CM | POA: Diagnosis not present

## 2020-04-24 DIAGNOSIS — M545 Low back pain, unspecified: Secondary | ICD-10-CM | POA: Diagnosis not present

## 2020-04-24 DIAGNOSIS — R404 Transient alteration of awareness: Secondary | ICD-10-CM | POA: Diagnosis not present

## 2020-04-24 DIAGNOSIS — G5711 Meralgia paresthetica, right lower limb: Secondary | ICD-10-CM | POA: Diagnosis not present

## 2020-04-24 DIAGNOSIS — I10 Essential (primary) hypertension: Secondary | ICD-10-CM | POA: Diagnosis not present

## 2020-04-28 ENCOUNTER — Other Ambulatory Visit: Payer: Self-pay

## 2020-04-28 ENCOUNTER — Encounter (HOSPITAL_COMMUNITY): Payer: Self-pay | Admitting: Psychiatry

## 2020-04-28 ENCOUNTER — Telehealth (INDEPENDENT_AMBULATORY_CARE_PROVIDER_SITE_OTHER): Payer: BC Managed Care – PPO | Admitting: Psychiatry

## 2020-04-28 DIAGNOSIS — F900 Attention-deficit hyperactivity disorder, predominantly inattentive type: Secondary | ICD-10-CM | POA: Diagnosis not present

## 2020-04-28 DIAGNOSIS — F321 Major depressive disorder, single episode, moderate: Secondary | ICD-10-CM

## 2020-04-28 MED ORDER — LISDEXAMFETAMINE DIMESYLATE 70 MG PO CAPS: 70.0000 mg | ORAL_CAPSULE | Freq: Every day | ORAL | 0 refills | Status: DC

## 2020-04-28 MED ORDER — HYDROXYZINE HCL 25 MG PO TABS: ORAL_TABLET | ORAL | 2 refills | Status: DC

## 2020-04-28 MED ORDER — DULOXETINE HCL 60 MG PO CPEP: 60.0000 mg | ORAL_CAPSULE | Freq: Two times a day (BID) | ORAL | 2 refills | Status: DC

## 2020-04-28 MED ORDER — TRAZODONE HCL 100 MG PO TABS: 200.0000 mg | ORAL_TABLET | Freq: Every day | ORAL | 2 refills | Status: DC

## 2020-04-28 NOTE — Progress Notes (Signed)
Virtual Visit via Telephone Note  I connected with Laurie Reyes on 04/28/20 at  2:00 PM EST by telephone and verified that I am speaking with the correct person using two identifiers.  Location: Patient: home Provider: office   I discussed the limitations, risks, security and privacy concerns of performing an evaluation and management service by telephone and the availability of in person appointments. I also discussed with the patient that there may be a patient responsible charge related to this service. The patient expressed understanding and agreed to proceed.    I discussed the assessment and treatment plan with the patient. The patient was provided an opportunity to ask questions and all were answered. The patient agreed with the plan and demonstrated an understanding of the instructions.   The patient was advised to call back or seek an in-person evaluation if the symptoms worsen or if the condition fails to improve as anticipated.  I provided 15 minutes of non-face-to-face time during this encounter.   Levonne Spiller, MD  [facility] MD/PA/NP OP Progress Note  04/28/2020 2:18 PM Laurie Reyes  MRN:  [id number]  Chief Complaint:  Chief Complaint    Depression; Anxiety; Follow-up     HPI: This patient is a 59 year old married black female lives with her husband in [place]. 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  The patient returns for follow-up after 3 months.  She states that she has been having severe back pain and she thinks it is from sitting so much for her job.  She had finished physical therapy and was feeling better and then her back "went out again."  She recently saw highly neurology and was given muscle relaxers and gabapentin.  She cannot take too much of these due to drowsiness.  She is scheduled to have an MRI.  In the meantime the neurologist took her out of work for 4 weeks.  She states that she is  going to try to find a different job in [place] system if possible.  Overall her mood is fairly good.  She is sleeping well with the trazodone and the Vyvanse helped her stay focused.  She has been having more anxiety but admits she has not been using the hydroxyzine so I urged her to try it.  She denies thoughts of self-harm or suicide Visit Diagnosis:    ICD-10-CM   1. Current moderate episode of major depressive disorder, unspecified whether recurrent ([place])  F32.1   2. Attention deficit hyperactivity disorder (ADHD), predominantly inattentive type  F90.0     Past Psychiatric History: Previous outpatient treatment in our office  Past Medical History:  Past Medical History:  Diagnosis Date  . ADHD (attention deficit hyperactivity disorder)   . Gastric reflux   . Hot flashes, menopausal     Past Surgical History:  Procedure Laterality Date  . NASAL SINUS SURGERY    . SHOULDER SURGERY      Family Psychiatric History: see below  Family History:  Family History  Problem Relation Age of Onset  . ADD / ADHD Other   . ADD / ADHD Other   . Alcohol abuse Father     Social History:  Social History   Socioeconomic History  . Marital status: Married    Spouse name: Not on file  . Number of children: Not on file  . Years of education: Not on file  . Highest education level: Not on file  Occupational History  .  Not on file  Tobacco Use  . Smoking status: Former Research scientist (life sciences)  . Smokeless tobacco: Never Used  Substance and Sexual Activity  . Alcohol use: No  . Drug use: No  . Sexual activity: Yes  Other Topics Concern  . Not on file  Social History Narrative  . Not on file   Social Determinants of Health   Financial Resource Strain:   . Difficulty of Paying Living Expenses: Not on file  Food Insecurity:   . Worried About Charity fundraiser in the Last Year: Not on file  . Ran Out of Food in the Last Year: Not on file  Transportation Needs:   . Lack of  Transportation (Medical): Not on file  . Lack of Transportation (Non-Medical): Not on file  Physical Activity:   . Days of Exercise per Week: Not on file  . Minutes of Exercise per Session: Not on file  Stress:   . Feeling of Stress : Not on file  Social Connections:   . Frequency of Communication with Friends and Family: Not on file  . Frequency of Social Gatherings with Friends and Family: Not on file  . Attends Religious Services: Not on file  . Active Member of Clubs or Organizations: Not on file  . Attends Archivist Meetings: Not on file  . Marital Status: Not on file    Allergies:  Allergies  Allergen Reactions  . Penicillins   . Shellfish Allergy   . Sulfa Antibiotics     Metabolic Disorder Labs: No results found for: HGBA1C, MPG No results found for: PROLACTIN No results found for: CHOL, TRIG, HDL, CHOLHDL, VLDL, LDLCALC No results found for: TSH  Therapeutic Level Labs: No results found for: LITHIUM No results found for: VALPROATE No components found for:  CBMZ  Current Medications: Current Outpatient Medications  Medication Sig Dispense Refill  . amLODipine (NORVASC) 5 MG tablet Take 5 mg by mouth daily.    Marland Kitchen amLODipine-atorvastatin (CADUET) 5-10 MG tablet Take 1 tablet by mouth daily.    . baclofen (LIORESAL) 10 MG tablet baclofen 10 mg tablet    . cyclobenzaprine (FLEXERIL) 10 MG tablet cyclobenzaprine 10 mg tablet  Take 1 tablet twice a day by oral route as needed.    . DULoxetine (CYMBALTA) 60 MG capsule Take 1 capsule (60 mg total) by mouth 2 (two) times daily. 60 capsule 2  . EPINEPHrine (EPI-PEN) 0.3 mg/0.3 mL SOAJ injection     . ergocalciferol (VITAMIN D2) 1.25 MG (50000 UT) capsule Vitamin D2 1,250 mcg (50,000 unit) capsule  Take 1 capsule every month by oral route.    . gabapentin (NEURONTIN) 300 MG capsule   1  . hydrOXYzine (ATARAX/VISTARIL) 25 MG tablet TAKE (1) TABLET BY MOUTH ONCE DAILY AS NEEDED FOR ANXIETY. 30 tablet 2  . LINZESS  72 MCG capsule     . lisdexamfetamine (VYVANSE) 70 MG capsule Take 1 capsule (70 mg total) by mouth daily. 30 capsule 0  . lisdexamfetamine (VYVANSE) 70 MG capsule Take 1 capsule (70 mg total) by mouth daily. 30 capsule 0  . lisdexamfetamine (VYVANSE) 70 MG capsule Take 1 capsule (70 mg total) by mouth daily. 30 capsule 0  . pantoprazole (PROTONIX) 40 MG tablet Take 40 mg by mouth 2 (two) times daily.     . traMADol (ULTRAM) 50 MG tablet tramadol 50 mg tablet  Take 2 tablets twice a day by oral route as needed.  Take 1-2 twice a day as needed    .  traZODone (DESYREL) 100 MG tablet Take 2 tablets (200 mg total) by mouth at bedtime. 60 tablet 2   No current facility-administered medications for this visit.     Musculoskeletal: Strength & Muscle Tone: decreased Gait & Station: unsteady Patient leans: N/A  Psychiatric Specialty Exam: Review of Systems  Musculoskeletal: Positive for back pain.  Psychiatric/Behavioral: The patient is nervous/anxious.   All other systems reviewed and are negative.   There were no vitals taken for this visit.There is no height or weight on file to calculate BMI.  General Appearance: NA  Eye Contact:  NA  Speech:  Clear and Coherent  Volume:  Normal  Mood:  Anxious and Euthymic  Affect:  NA  Thought Process:  Goal Directed  Orientation:  Full (Time, Place, and Person)  Thought Content: Rumination   Suicidal Thoughts:  No  Homicidal Thoughts:  No  Memory:  Immediate;   Good Recent;   Good Remote;   Good  Judgement:  Good  Insight:  Good  Psychomotor Activity:  Decreased  Concentration:  Concentration: Fair and Attention Span: Fair  Recall:  Good  Fund of Knowledge: Good  Language: Good  Akathisia:  No  Handed:  Right  AIMS (if indicated): not done  Assets:  Communication Skills Desire for Improvement Resilience Social Support Talents/Skills  ADL's:  Intact  Cognition: WNL  Sleep:  Good   Screenings:   Assessment and Plan: This  patient is a 59 year old female with a history of depression ADD and insomnia.  She is having more chronic pain related to work and is now been taken out of work for 4 weeks.  She does feel like the increase in Cymbalta has helped her mood so she will continue 60 mg twice daily, she will continue Vyvanse 70 mg every morning for focus, hydroxyzine 25 mg daily as needed for anxiety and trazodone 100 to 200 mg at bedtime for sleep.  She will return to see me in 3 months   Levonne Spiller, MD 04/28/2020, 2:18 PM

## 2020-05-05 DIAGNOSIS — G4733 Obstructive sleep apnea (adult) (pediatric): Secondary | ICD-10-CM | POA: Diagnosis not present

## 2020-05-26 DIAGNOSIS — G4733 Obstructive sleep apnea (adult) (pediatric): Secondary | ICD-10-CM | POA: Diagnosis not present

## 2020-05-26 DIAGNOSIS — I10 Essential (primary) hypertension: Secondary | ICD-10-CM | POA: Diagnosis not present

## 2020-05-26 DIAGNOSIS — M545 Low back pain, unspecified: Secondary | ICD-10-CM | POA: Diagnosis not present

## 2020-05-26 DIAGNOSIS — M5416 Radiculopathy, lumbar region: Secondary | ICD-10-CM | POA: Diagnosis not present

## 2020-06-04 DIAGNOSIS — G4733 Obstructive sleep apnea (adult) (pediatric): Secondary | ICD-10-CM | POA: Diagnosis not present

## 2020-06-23 ENCOUNTER — Ambulatory Visit (HOSPITAL_COMMUNITY)
Admission: RE | Admit: 2020-06-23 | Discharge: 2020-06-23 | Disposition: A | Discharge status: Home / Self Care | Payer: BC Managed Care – PPO | Source: Ambulatory Visit | Attending: Neurology | Admitting: Neurology

## 2020-06-23 ENCOUNTER — Other Ambulatory Visit: Payer: Self-pay

## 2020-06-23 DIAGNOSIS — M5416 Radiculopathy, lumbar region: Secondary | ICD-10-CM

## 2020-06-23 DIAGNOSIS — M545 Low back pain, unspecified: Secondary | ICD-10-CM | POA: Diagnosis not present

## 2020-06-24 ENCOUNTER — Other Ambulatory Visit: Payer: Self-pay | Admitting: Neurology

## 2020-06-24 DIAGNOSIS — M5416 Radiculopathy, lumbar region: Secondary | ICD-10-CM

## 2020-07-01 ENCOUNTER — Ambulatory Visit
Admission: RE | Admit: 2020-07-01 | Discharge: 2020-07-01 | Disposition: A | Discharge status: Home / Self Care | Payer: BC Managed Care – PPO | Source: Ambulatory Visit | Attending: Neurology | Admitting: Neurology

## 2020-07-01 ENCOUNTER — Other Ambulatory Visit: Payer: Self-pay

## 2020-07-01 DIAGNOSIS — M5116 Intervertebral disc disorders with radiculopathy, lumbar region: Secondary | ICD-10-CM | POA: Diagnosis not present

## 2020-07-01 DIAGNOSIS — M5416 Radiculopathy, lumbar region: Secondary | ICD-10-CM

## 2020-07-01 MED ORDER — METHYLPREDNISOLONE ACETATE 40 MG/ML INJ SUSP (RADIOLOG
120.0000 mg | Freq: Once | INTRAMUSCULAR | Status: AC
  Administered 2020-07-01: 120 mg via EPIDURAL

## 2020-07-01 MED ORDER — IOPAMIDOL (ISOVUE-M 200) INJECTION 41%
1.0000 mL | Freq: Once | INTRAMUSCULAR | Status: AC
  Administered 2020-07-01: 1 mL via EPIDURAL

## 2020-07-01 NOTE — Discharge Instructions (Signed)

## 2020-07-02 ENCOUNTER — Other Ambulatory Visit (HOSPITAL_COMMUNITY): Payer: Self-pay | Admitting: Psychiatry

## 2020-07-08 ENCOUNTER — Telehealth (HOSPITAL_COMMUNITY): Payer: Self-pay | Admitting: Psychiatry

## 2020-07-08 NOTE — Telephone Encounter (Signed)
Called both home and cell unable to leave message a either number no vm ste up for either number

## 2020-07-21 ENCOUNTER — Other Ambulatory Visit: Payer: Self-pay | Admitting: Neurology

## 2020-07-21 DIAGNOSIS — E559 Vitamin D deficiency, unspecified: Secondary | ICD-10-CM | POA: Diagnosis not present

## 2020-07-21 DIAGNOSIS — M545 Low back pain, unspecified: Secondary | ICD-10-CM

## 2020-07-21 DIAGNOSIS — E1165 Type 2 diabetes mellitus with hyperglycemia: Secondary | ICD-10-CM | POA: Diagnosis not present

## 2020-07-21 DIAGNOSIS — R739 Hyperglycemia, unspecified: Secondary | ICD-10-CM | POA: Diagnosis not present

## 2020-07-21 DIAGNOSIS — M5416 Radiculopathy, lumbar region: Secondary | ICD-10-CM | POA: Diagnosis not present

## 2020-07-21 DIAGNOSIS — I10 Essential (primary) hypertension: Secondary | ICD-10-CM | POA: Diagnosis not present

## 2020-07-21 DIAGNOSIS — G8929 Other chronic pain: Secondary | ICD-10-CM

## 2020-07-21 DIAGNOSIS — E785 Hyperlipidemia, unspecified: Secondary | ICD-10-CM | POA: Diagnosis not present

## 2020-07-21 DIAGNOSIS — R569 Unspecified convulsions: Secondary | ICD-10-CM | POA: Diagnosis not present

## 2020-07-21 DIAGNOSIS — F330 Major depressive disorder, recurrent, mild: Secondary | ICD-10-CM | POA: Diagnosis not present

## 2020-07-25 ENCOUNTER — Ambulatory Visit
Admission: RE | Admit: 2020-07-25 | Discharge: 2020-07-25 | Disposition: A | Discharge status: Home / Self Care | Payer: BC Managed Care – PPO | Source: Ambulatory Visit | Attending: Neurology | Admitting: Neurology

## 2020-07-25 ENCOUNTER — Other Ambulatory Visit: Payer: Self-pay

## 2020-07-25 DIAGNOSIS — M545 Low back pain, unspecified: Secondary | ICD-10-CM

## 2020-07-25 DIAGNOSIS — M47817 Spondylosis without myelopathy or radiculopathy, lumbosacral region: Secondary | ICD-10-CM | POA: Diagnosis not present

## 2020-07-25 MED ORDER — IOPAMIDOL (ISOVUE-M 200) INJECTION 41%
1.0000 mL | Freq: Once | INTRAMUSCULAR | Status: AC
  Administered 2020-07-25: 1 mL via EPIDURAL

## 2020-07-25 MED ORDER — METHYLPREDNISOLONE ACETATE 40 MG/ML INJ SUSP (RADIOLOG
120.0000 mg | Freq: Once | INTRAMUSCULAR | Status: AC
  Administered 2020-07-25: 120 mg via EPIDURAL

## 2020-07-25 NOTE — Discharge Instructions (Signed)

## 2020-07-29 ENCOUNTER — Telehealth (HOSPITAL_COMMUNITY): Payer: Self-pay | Admitting: Psychiatry

## 2020-07-29 ENCOUNTER — Encounter (HOSPITAL_COMMUNITY): Payer: Self-pay | Admitting: Psychiatry

## 2020-07-29 ENCOUNTER — Other Ambulatory Visit: Payer: Self-pay

## 2020-07-29 ENCOUNTER — Other Ambulatory Visit: Payer: BC Managed Care – PPO

## 2020-07-29 ENCOUNTER — Telehealth (INDEPENDENT_AMBULATORY_CARE_PROVIDER_SITE_OTHER): Payer: BC Managed Care – PPO | Admitting: Psychiatry

## 2020-07-29 DIAGNOSIS — F900 Attention-deficit hyperactivity disorder, predominantly inattentive type: Secondary | ICD-10-CM

## 2020-07-29 DIAGNOSIS — F321 Major depressive disorder, single episode, moderate: Secondary | ICD-10-CM | POA: Diagnosis not present

## 2020-07-29 MED ORDER — DULOXETINE HCL 60 MG PO CPEP: ORAL_CAPSULE | ORAL | 5 refills | Status: DC

## 2020-07-29 MED ORDER — TRAZODONE HCL 100 MG PO TABS: 200.0000 mg | ORAL_TABLET | Freq: Every day | ORAL | 2 refills | Status: DC

## 2020-07-29 MED ORDER — LISDEXAMFETAMINE DIMESYLATE 70 MG PO CAPS: 70.0000 mg | ORAL_CAPSULE | Freq: Every day | ORAL | 0 refills | Status: DC

## 2020-07-29 MED ORDER — HYDROXYZINE HCL 25 MG PO TABS: ORAL_TABLET | ORAL | 2 refills | Status: DC

## 2020-07-29 NOTE — Telephone Encounter (Signed)
Called to schedule f/u appt, left vm 

## 2020-07-29 NOTE — Progress Notes (Signed)
Virtual Visit via Telephone Note  I connected with Laurie Reyes on 07/29/20 at  9:40 AM EST by telephone and verified that I am speaking with the correct person using two identifiers.  Location: Patient: home Provider: home   I discussed the limitations, risks, security and privacy concerns of performing an evaluation and management service by telephone and the availability of in person appointments. I also discussed with the patient that there may be a patient responsible charge related to this service. The patient expressed understanding and agreed to proceed.   I discussed the assessment and treatment plan with the patient. The patient was provided an opportunity to ask questions and all were answered. The patient agreed with the plan and demonstrated an understanding of the instructions.   The patient was advised to call back or seek an in-person evaluation if the symptoms worsen or if the condition fails to improve as anticipated.  I provided 15 minutes of non-face-to-face time during this encounter.   Levonne Spiller, MD  [facility] MD/PA/NP OP Progress Note  07/29/2020 10:36 AM Laurie Reyes  MRN:  [id number]  Chief Complaint:  Chief Complaint    ADHD; Depression; Follow-up     HPI: This patient is a 60 year old married black female lives with her husband in [place]. 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  The patient returns for follow-up after 3 months. She states that she has been out of work on medical leave for the last 2 months because of back pain. She has gotten some injections in her back however and now she is starting to feel better. She is hoping to return to work next week.  In the interim she has been doing some research on trying to start some community projects to help kids in her area. She states that her mood has been stable and she denies serious depression or anxiety. She is sleeping well with  the trazodone. The Vyvanse continues to help with her focus. Visit Diagnosis:    ICD-10-CM   1. Current moderate episode of major depressive disorder, unspecified whether recurrent ([place])  F32.1   2. Attention deficit hyperactivity disorder (ADHD), predominantly inattentive type  F90.0     Past Psychiatric History: Previous outpatient treatment in our office  Past Medical History:  Past Medical History:  Diagnosis Date  . ADHD (attention deficit hyperactivity disorder)   . Gastric reflux   . Hot flashes, menopausal     Past Surgical History:  Procedure Laterality Date  . NASAL SINUS SURGERY    . SHOULDER SURGERY      Family Psychiatric History: See below  Family History:  Family History  Problem Relation Age of Onset  . ADD / ADHD Other   . ADD / ADHD Other   . Alcohol abuse Father     Social History:  Social History   Socioeconomic History  . Marital status: Married    Spouse name: Not on file  . Number of children: Not on file  . Years of education: Not on file  . Highest education level: Not on file  Occupational History  . Not on file  Tobacco Use  . Smoking status: Former Research scientist (life sciences)  . Smokeless tobacco: Never Used  Substance and Sexual Activity  . Alcohol use: No  . Drug use: No  . Sexual activity: Yes  Other Topics Concern  . Not on file  Social History Narrative  . Not on file   Social Determinants  of Health   Financial Resource Strain: Not on file  Food Insecurity: Not on file  Transportation Needs: Not on file  Physical Activity: Not on file  Stress: Not on file  Social Connections: Not on file    Allergies:  Allergies  Allergen Reactions  . Penicillins Shortness Of Breath and Rash  . Shellfish Allergy Anaphylaxis and Shortness Of Breath  . Sulfa Antibiotics Shortness Of Breath and Rash    Metabolic Disorder Labs: No results found for: HGBA1C, MPG No results found for: PROLACTIN No results found for: CHOL, TRIG, HDL, CHOLHDL, VLDL,  LDLCALC No results found for: TSH  Therapeutic Level Labs: No results found for: LITHIUM No results found for: VALPROATE No components found for:  CBMZ  Current Medications: Current Outpatient Medications  Medication Sig Dispense Refill  . lisdexamfetamine (VYVANSE) 70 MG capsule Take 1 capsule (70 mg total) by mouth daily. 30 capsule 0  . lisdexamfetamine (VYVANSE) 70 MG capsule Take 1 capsule (70 mg total) by mouth daily. 30 capsule 0  . amLODipine (NORVASC) 5 MG tablet Take 5 mg by mouth daily.    Marland Kitchen amLODipine-atorvastatin (CADUET) 5-10 MG tablet Take 1 tablet by mouth daily.    . baclofen (LIORESAL) 10 MG tablet baclofen 10 mg tablet    . cyclobenzaprine (FLEXERIL) 10 MG tablet cyclobenzaprine 10 mg tablet  Take 1 tablet twice a day by oral route as needed.    . DULoxetine (CYMBALTA) 60 MG capsule TAKE (1) CAPSULE BY MOUTH TWICE DAILY. 60 capsule 5  . EPINEPHrine (EPI-PEN) 0.3 mg/0.3 mL SOAJ injection     . ergocalciferol (VITAMIN D2) 1.25 MG (50000 UT) capsule Vitamin D2 1,250 mcg (50,000 unit) capsule  Take 1 capsule every month by oral route.    . gabapentin (NEURONTIN) 300 MG capsule   1  . hydrOXYzine (ATARAX/VISTARIL) 25 MG tablet TAKE 1 TABLET BY MOUTH ONCE DAILY AS NEEDED FOR ANXIETY 30 tablet 2  . LINZESS 72 MCG capsule     . lisdexamfetamine (VYVANSE) 70 MG capsule Take 1 capsule (70 mg total) by mouth daily. 30 capsule 0  . meclizine (ANTIVERT) 25 MG tablet meclizine 25 mg tablet    . nitrofurantoin, macrocrystal-monohydrate, (MACROBID) 100 MG capsule nitrofurantoin monohydrate/macrocrystals 100 mg capsule    . ondansetron (ZOFRAN) 4 MG tablet ondansetron HCl 4 mg tablet    . pantoprazole (PROTONIX) 40 MG tablet Take 40 mg by mouth 2 (two) times daily.     Marland Kitchen Plecanatide 3 MG TABS Take by mouth.    . traMADol (ULTRAM) 50 MG tablet tramadol 50 mg tablet  Take 2 tablets twice a day by oral route as needed.  Take 1-2 twice a day as needed    . traZODone (DESYREL) 100 MG  tablet Take 2 tablets (200 mg total) by mouth at bedtime. 60 tablet 2   No current facility-administered medications for this visit.     Musculoskeletal: Strength & Muscle Tone: within normal limits Gait & Station: normal Patient leans: N/A  Psychiatric Specialty Exam: Review of Systems  Musculoskeletal: Positive for back pain.  All other systems reviewed and are negative.   There were no vitals taken for this visit.There is no height or weight on file to calculate BMI.  General Appearance: NA  Eye Contact:  NA  Speech:  Clear and Coherent  Volume:  Normal  Mood:  Euthymic  Affect:  NA  Thought Process:  Goal Directed  Orientation:  Full (Time, Place, and Person)  Thought Content: WDL  Suicidal Thoughts:  No  Homicidal Thoughts:  No  Memory:  Immediate;   Good Recent;   Good Remote;   Good  Judgement:  Good  Insight:  Good  Psychomotor Activity:  Normal  Concentration:  Concentration: Good and Attention Span: Good  Recall:  Good  Fund of Knowledge: Good  Language: Good  Akathisia:  No  Handed:  Right  AIMS (if indicated): not done  Assets:  Communication Skills Desire for Improvement Resilience Social Support Talents/Skills  ADL's:  Intact  Cognition: WNL  Sleep:  Good   Screenings:   Assessment and Plan: This patient is a 60 year old female with a history depression ADD and insomnia. She is doing well on her current regimen. She she will continue Cymbalta 60 mg twice daily for depression and chronic pain, Vyvanse 70 mg every morning for focus, hydroxyzine 25 mg daily if needed for anxiety and trazodone 200 mg at bedtime for sleep. She will return to see me in 3 months   Levonne Spiller, MD 07/29/2020, 10:36 AM

## 2020-08-15 DIAGNOSIS — M5416 Radiculopathy, lumbar region: Secondary | ICD-10-CM | POA: Diagnosis not present

## 2020-08-18 DIAGNOSIS — M5416 Radiculopathy, lumbar region: Secondary | ICD-10-CM | POA: Diagnosis not present

## 2020-08-18 DIAGNOSIS — G4733 Obstructive sleep apnea (adult) (pediatric): Secondary | ICD-10-CM | POA: Diagnosis not present

## 2020-08-18 DIAGNOSIS — M545 Low back pain, unspecified: Secondary | ICD-10-CM | POA: Diagnosis not present

## 2020-08-18 DIAGNOSIS — I10 Essential (primary) hypertension: Secondary | ICD-10-CM | POA: Diagnosis not present

## 2020-08-20 ENCOUNTER — Other Ambulatory Visit: Payer: Self-pay | Admitting: Neurology

## 2020-08-20 DIAGNOSIS — M545 Low back pain, unspecified: Secondary | ICD-10-CM

## 2020-08-25 DIAGNOSIS — I10 Essential (primary) hypertension: Secondary | ICD-10-CM | POA: Diagnosis not present

## 2020-08-25 DIAGNOSIS — E669 Obesity, unspecified: Secondary | ICD-10-CM | POA: Diagnosis not present

## 2020-08-25 DIAGNOSIS — R079 Chest pain, unspecified: Secondary | ICD-10-CM | POA: Diagnosis not present

## 2020-08-25 DIAGNOSIS — R0602 Shortness of breath: Secondary | ICD-10-CM | POA: Diagnosis not present

## 2020-09-03 ENCOUNTER — Other Ambulatory Visit: Payer: Self-pay

## 2020-09-03 ENCOUNTER — Telehealth (HOSPITAL_COMMUNITY): Payer: BC Managed Care – PPO | Admitting: Psychiatry

## 2020-09-04 ENCOUNTER — Other Ambulatory Visit: Payer: Self-pay

## 2020-09-04 ENCOUNTER — Telehealth (HOSPITAL_COMMUNITY): Payer: Self-pay | Admitting: Psychiatry

## 2020-09-04 ENCOUNTER — Encounter (HOSPITAL_COMMUNITY): Payer: Self-pay

## 2020-09-04 ENCOUNTER — Telehealth (INDEPENDENT_AMBULATORY_CARE_PROVIDER_SITE_OTHER): Payer: BC Managed Care – PPO | Admitting: Psychiatry

## 2020-09-04 ENCOUNTER — Encounter (HOSPITAL_COMMUNITY): Payer: Self-pay | Admitting: Psychiatry

## 2020-09-04 DIAGNOSIS — F900 Attention-deficit hyperactivity disorder, predominantly inattentive type: Secondary | ICD-10-CM

## 2020-09-04 DIAGNOSIS — F321 Major depressive disorder, single episode, moderate: Secondary | ICD-10-CM

## 2020-09-04 MED ORDER — TRAZODONE HCL 100 MG PO TABS: 200.0000 mg | ORAL_TABLET | Freq: Every day | ORAL | 2 refills | Status: DC

## 2020-09-04 MED ORDER — HYDROXYZINE HCL 25 MG PO TABS: ORAL_TABLET | ORAL | 2 refills | Status: DC

## 2020-09-04 MED ORDER — LISDEXAMFETAMINE DIMESYLATE 70 MG PO CAPS: 70.0000 mg | ORAL_CAPSULE | Freq: Every day | ORAL | 0 refills | Status: DC

## 2020-09-04 MED ORDER — DULOXETINE HCL 60 MG PO CPEP: ORAL_CAPSULE | ORAL | 5 refills | Status: DC

## 2020-09-04 NOTE — Progress Notes (Signed)
Virtual Visit via Telephone Note  I connected with Fusako Tanabe on 09/04/20 at 10:40 AM EDT by telephone and verified that I am speaking with the correct person using two identifiers.  Location: Patient: home Provider: office   I discussed the limitations, risks, security and privacy concerns of performing an evaluation and management service by telephone and the availability of in person appointments. I also discussed with the patient that there may be a patient responsible charge related to this service. The patient expressed understanding and agreed to proceed.    I discussed the assessment and treatment plan with the patient. The patient was provided an opportunity to ask questions and all were answered. The patient agreed with the plan and demonstrated an understanding of the instructions.   The patient was advised to call back or seek an in-person evaluation if the symptoms worsen or if the condition fails to improve as anticipated.  I provided 15 minutes of non-face-to-face time during this encounter.   Levonne Spiller, MD  [facility] MD/PA/NP OP Progress Note  09/04/2020 10:56 AM [name]  MRN:  [id number]  Chief Complaint:  Chief Complaint    Depression; Anxiety; Follow-up     HPI: This patient is a 60 year old married black female lives with her husband in [place]. 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  The patient returns for follow-up after 6 weeks.  Last time she had been out on medical leave because of back pain.  When she got back to work she was transferred to the [facility] office and is doing a totally different job.  She states that she is getting very little training and is constantly being berated and criticized by her supervisor.  She is very upset and not hardly able to function.  She just works from home in her office and goes back to bed.  She has been crying all the time and having some  difficulty sleeping.  She claims that she just cannot continue to work this way.  I have encouraged her to make a report to her HR department.  If she really feels like the work is making her depression anxiety so bad that she cannot function well we can fill out forms for FMLA leave or for short-term disability.  She denies suicidal ideation.  She does think the medications work but that she is just overly stressed by the situation Visit Diagnosis:    ICD-10-CM   1. Current moderate episode of major depressive disorder, unspecified whether recurrent ([place])  F32.1   2. Attention deficit hyperactivity disorder (ADHD), predominantly inattentive type  F90.0     Past Psychiatric History: Previous outpatient treatment in our office  Past Medical History:  Past Medical History:  Diagnosis Date  . ADHD (attention deficit hyperactivity disorder)   . Gastric reflux   . Hot flashes, menopausal     Past Surgical History:  Procedure Laterality Date  . NASAL SINUS SURGERY    . SHOULDER SURGERY      Family Psychiatric History: see below  Family History:  Family History  Problem Relation Age of Onset  . ADD / ADHD Other   . ADD / ADHD Other   . Alcohol abuse Father     Social History:  Social History   Socioeconomic History  . Marital status: Married    Spouse name: Not on file  . Number of children: Not on file  . Years of education: Not on file  .  Highest education level: Not on file  Occupational History  . Not on file  Tobacco Use  . Smoking status: Former Research scientist (life sciences)  . Smokeless tobacco: Never Used  Substance and Sexual Activity  . Alcohol use: No  . Drug use: No  . Sexual activity: Yes  Other Topics Concern  . Not on file  Social History Narrative  . Not on file   Social Determinants of Health   Financial Resource Strain: Not on file  Food Insecurity: Not on file  Transportation Needs: Not on file  Physical Activity: Not on file  Stress: Not on file  Social  Connections: Not on file    Allergies:  Allergies  Allergen Reactions  . Penicillins Shortness Of Breath and Rash  . Shellfish Allergy Anaphylaxis and Shortness Of Breath  . Sulfa Antibiotics Shortness Of Breath and Rash    Metabolic Disorder Labs: No results found for: HGBA1C, MPG No results found for: PROLACTIN No results found for: CHOL, TRIG, HDL, CHOLHDL, VLDL, LDLCALC No results found for: TSH  Therapeutic Level Labs: No results found for: LITHIUM No results found for: VALPROATE No components found for:  CBMZ  Current Medications: Current Outpatient Medications  Medication Sig Dispense Refill  . amLODipine (NORVASC) 5 MG tablet Take 5 mg by mouth daily.    Marland Kitchen amLODipine-atorvastatin (CADUET) 5-10 MG tablet Take 1 tablet by mouth daily.    . baclofen (LIORESAL) 10 MG tablet baclofen 10 mg tablet    . cyclobenzaprine (FLEXERIL) 10 MG tablet cyclobenzaprine 10 mg tablet  Take 1 tablet twice a day by oral route as needed.    . DULoxetine (CYMBALTA) 60 MG capsule TAKE (1) CAPSULE BY MOUTH TWICE DAILY. 60 capsule 5  . EPINEPHrine (EPI-PEN) 0.3 mg/0.3 mL SOAJ injection     . ergocalciferol (VITAMIN D2) 1.25 MG (50000 UT) capsule Vitamin D2 1,250 mcg (50,000 unit) capsule  Take 1 capsule every month by oral route.    . gabapentin (NEURONTIN) 300 MG capsule   1  . hydrOXYzine (ATARAX/VISTARIL) 25 MG tablet TAKE 1 TABLET BY MOUTH ONCE DAILY AS NEEDED FOR ANXIETY 30 tablet 2  . LINZESS 72 MCG capsule     . lisdexamfetamine (VYVANSE) 70 MG capsule Take 1 capsule (70 mg total) by mouth daily. 30 capsule 0  . lisdexamfetamine (VYVANSE) 70 MG capsule Take 1 capsule (70 mg total) by mouth daily. 30 capsule 0  . lisdexamfetamine (VYVANSE) 70 MG capsule Take 1 capsule (70 mg total) by mouth daily. 30 capsule 0  . meclizine (ANTIVERT) 25 MG tablet meclizine 25 mg tablet    . nitrofurantoin, macrocrystal-monohydrate, (MACROBID) 100 MG capsule nitrofurantoin monohydrate/macrocrystals 100 mg  capsule    . ondansetron (ZOFRAN) 4 MG tablet ondansetron HCl 4 mg tablet    . pantoprazole (PROTONIX) 40 MG tablet Take 40 mg by mouth 2 (two) times daily.     Marland Kitchen Plecanatide 3 MG TABS Take by mouth.    . traMADol (ULTRAM) 50 MG tablet tramadol 50 mg tablet  Take 2 tablets twice a day by oral route as needed.  Take 1-2 twice a day as needed    . traZODone (DESYREL) 100 MG tablet Take 2 tablets (200 mg total) by mouth at bedtime. 60 tablet 2   No current facility-administered medications for this visit.     Musculoskeletal: Strength & Muscle Tone: within normal limits Gait & Station: normal Patient leans: N/A  Psychiatric Specialty Exam: Review of Systems  Psychiatric/Behavioral: Positive for decreased concentration, dysphoric mood  and sleep disturbance. The patient is nervous/anxious.   All other systems reviewed and are negative.   There were no vitals taken for this visit.There is no height or weight on file to calculate BMI.  General Appearance: NA  Eye Contact:  NA  Speech:  Clear and Coherent  Volume:  Normal  Mood:  Anxious and Depressed  Affect:  NA  Thought Process:  Goal Directed  Orientation:  Full (Time, Place, and Person)  Thought Content: Rumination   Suicidal Thoughts:  No  Homicidal Thoughts:  No  Memory:  Immediate;   Good Recent;   Fair Remote;   Fair  Judgement:  Good  Insight:  Good  Psychomotor Activity:  Decreased  Concentration:  Concentration: Poor and Attention Span: Poor  Recall:  Good  Fund of Knowledge: Good  Language: Good  Akathisia:  No  Handed:  Right  AIMS (if indicated): not done  Assets:  Communication Skills Desire for Improvement Resilience Social Support Talents/Skills  ADL's:  Intact  Cognition: WNL  Sleep:  Fair   Screenings: PHQ2-9   Flowsheet Row Video Visit from 09/04/2020 in [facility]  PHQ-2 Total Score 5  PHQ-9 Total Score 18    Flowsheet Row Video Visit from  09/04/2020 in [place] No Risk       Assessment and Plan: This patient is a 60 year old female with a history of depression ADD and insomnia.  She is no longer doing well since her job has become so stressful.  She may need to go out on short-term disability.  I will be happy to fill out paperwork if needed.  She will now continue Cymbalta 60 mg twice daily for depression and chronic pain, Vyvanse 70 mg every morning for focus and trazodone 200 mg at bedtime for sleep, hydroxyzine 25 mg daily if needed for anxiety.  She will return to see me in 4 weeks and we will refer her for therapy   Levonne Spiller, MD 09/04/2020, 10:56 AM

## 2020-09-04 NOTE — Telephone Encounter (Signed)
Called to schedule f/u appt, and schedule NP appt, pt advised she would return call

## 2020-09-05 DIAGNOSIS — I10 Essential (primary) hypertension: Secondary | ICD-10-CM | POA: Diagnosis not present

## 2020-09-05 DIAGNOSIS — F909 Attention-deficit hyperactivity disorder, unspecified type: Secondary | ICD-10-CM | POA: Diagnosis not present

## 2020-09-05 DIAGNOSIS — Z0001 Encounter for general adult medical examination with abnormal findings: Secondary | ICD-10-CM | POA: Diagnosis not present

## 2020-09-05 DIAGNOSIS — Z6836 Body mass index (BMI) 36.0-36.9, adult: Secondary | ICD-10-CM | POA: Diagnosis not present

## 2020-09-05 DIAGNOSIS — F330 Major depressive disorder, recurrent, mild: Secondary | ICD-10-CM | POA: Diagnosis not present

## 2020-09-15 DIAGNOSIS — M48061 Spinal stenosis, lumbar region without neurogenic claudication: Secondary | ICD-10-CM | POA: Diagnosis not present

## 2020-09-15 DIAGNOSIS — I10 Essential (primary) hypertension: Secondary | ICD-10-CM | POA: Diagnosis not present

## 2020-09-15 DIAGNOSIS — M5126 Other intervertebral disc displacement, lumbar region: Secondary | ICD-10-CM | POA: Diagnosis not present

## 2020-09-15 DIAGNOSIS — M5416 Radiculopathy, lumbar region: Secondary | ICD-10-CM | POA: Diagnosis not present

## 2020-09-19 ENCOUNTER — Ambulatory Visit (HOSPITAL_COMMUNITY): Payer: BC Managed Care – PPO | Admitting: Psychiatry

## 2020-09-19 ENCOUNTER — Other Ambulatory Visit: Payer: Self-pay

## 2020-09-19 DIAGNOSIS — I10 Essential (primary) hypertension: Secondary | ICD-10-CM | POA: Diagnosis not present

## 2020-09-19 DIAGNOSIS — M542 Cervicalgia: Secondary | ICD-10-CM | POA: Diagnosis not present

## 2020-09-19 DIAGNOSIS — M5416 Radiculopathy, lumbar region: Secondary | ICD-10-CM | POA: Diagnosis not present

## 2020-09-19 DIAGNOSIS — R569 Unspecified convulsions: Secondary | ICD-10-CM | POA: Diagnosis not present

## 2020-10-02 ENCOUNTER — Ambulatory Visit (HOSPITAL_COMMUNITY): Payer: BC Managed Care – PPO | Admitting: Psychiatry

## 2020-10-07 ENCOUNTER — Other Ambulatory Visit: Payer: Self-pay

## 2020-10-07 ENCOUNTER — Telehealth (INDEPENDENT_AMBULATORY_CARE_PROVIDER_SITE_OTHER): Payer: BC Managed Care – PPO | Admitting: Psychiatry

## 2020-10-07 ENCOUNTER — Encounter (HOSPITAL_COMMUNITY): Payer: Self-pay | Admitting: Psychiatry

## 2020-10-07 DIAGNOSIS — F900 Attention-deficit hyperactivity disorder, predominantly inattentive type: Secondary | ICD-10-CM | POA: Diagnosis not present

## 2020-10-07 DIAGNOSIS — F321 Major depressive disorder, single episode, moderate: Secondary | ICD-10-CM | POA: Diagnosis not present

## 2020-10-07 MED ORDER — LISDEXAMFETAMINE DIMESYLATE 70 MG PO CAPS: 70.0000 mg | ORAL_CAPSULE | Freq: Every day | ORAL | 0 refills | Status: DC

## 2020-10-07 MED ORDER — TRAZODONE HCL 100 MG PO TABS: 200.0000 mg | ORAL_TABLET | Freq: Every day | ORAL | 2 refills | Status: DC

## 2020-10-07 MED ORDER — DULOXETINE HCL 60 MG PO CPEP: ORAL_CAPSULE | ORAL | 5 refills | Status: DC

## 2020-10-07 NOTE — Progress Notes (Signed)
Virtual Visit via Telephone Note  I connected with Laurie Reyes on 10/07/20 at  9:20 AM EDT by telephone and verified that I am speaking with the correct person using two identifiers.  Location: Patient: home Provider: home   I discussed the limitations, risks, security and privacy concerns of performing an evaluation and management service by telephone and the availability of in person appointments. I also discussed with the patient that there may be a patient responsible charge related to this service. The patient expressed understanding and agreed to proceed.    I discussed the assessment and treatment plan with the patient. The patient was provided an opportunity to ask questions and all were answered. The patient agreed with the plan and demonstrated an understanding of the instructions.   The patient was advised to call back or seek an in-person evaluation if the symptoms worsen or if the condition fails to improve as anticipated.  I provided 15 minutes of non-face-to-face time during this encounter.   Laurie Spiller, MD  [facility] MD/PA/NP OP Progress Note  10/07/2020 9:46 AM Laurie Reyes  MRN:  [id number]  Chief Complaint:  Chief Complaint    Depression; Anxiety; Follow-up     HPI: This patient is a 60 year old married black female who lives with her husband in [place].  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  Last time I talked with her she was very upset about her job.  Her job change decrease the and she was getting very little training by her report.  She was constantly being criticized.  She was extremely upset.  I did fill out forms for her to have temporary relief.  However since then her back pain is gotten worse and the neurosurgeon has decided that she needs surgery.  She is actually scheduled for surgery this Friday.  She is going to be out of work for this as well.  She has been out of work for the  last 3 weeks and she is trying to relax.  Her mood is definitely improved.  She is trying to get her mind and body prepared for surgery.  She denies suicidal ideation and seems very positive and upbeat. Visit Diagnosis:    ICD-10-CM   1. Current moderate episode of major depressive disorder, unspecified whether recurrent ([place])  F32.1   2. Attention deficit hyperactivity disorder (ADHD), predominantly inattentive type  F90.0     Past Psychiatric History: Previous outpatient treatment in our office  Past Medical History:  Past Medical History:  Diagnosis Date  . ADHD (attention deficit hyperactivity disorder)   . Gastric reflux   . Hot flashes, menopausal     Past Surgical History:  Procedure Laterality Date  . NASAL SINUS SURGERY    . SHOULDER SURGERY      Family Psychiatric History: see below  Family History:  Family History  Problem Relation Age of Onset  . ADD / ADHD Other   . ADD / ADHD Other   . Alcohol abuse Father     Social History:  Social History   Socioeconomic History  . Marital status: Married    Spouse name: Not on file  . Number of children: Not on file  . Years of education: Not on file  . Highest education level: Not on file  Occupational History  . Not on file  Tobacco Use  . Smoking status: Former Research scientist (life sciences)  . Smokeless tobacco: Never Used  Substance and Sexual Activity  .  Alcohol use: No  . Drug use: No  . Sexual activity: Yes  Other Topics Concern  . Not on file  Social History Narrative  . Not on file   Social Determinants of Health   Financial Resource Strain: Not on file  Food Insecurity: Not on file  Transportation Needs: Not on file  Physical Activity: Not on file  Stress: Not on file  Social Connections: Not on file    Allergies:  Allergies  Allergen Reactions  . Penicillins Shortness Of Breath and Rash  . Shellfish Allergy Anaphylaxis and Shortness Of Breath  . Sulfa Antibiotics Shortness Of Breath and Rash    Metabolic  Disorder Labs: No results found for: HGBA1C, MPG No results found for: PROLACTIN No results found for: CHOL, TRIG, HDL, CHOLHDL, VLDL, LDLCALC No results found for: TSH  Therapeutic Level Labs: No results found for: LITHIUM No results found for: VALPROATE No components found for:  CBMZ  Current Medications: Current Outpatient Medications  Medication Sig Dispense Refill  . amLODipine (NORVASC) 5 MG tablet Take 5 mg by mouth daily.    Marland Kitchen amLODipine-atorvastatin (CADUET) 5-10 MG tablet Take 1 tablet by mouth daily.    . baclofen (LIORESAL) 10 MG tablet baclofen 10 mg tablet    . cyclobenzaprine (FLEXERIL) 10 MG tablet cyclobenzaprine 10 mg tablet  Take 1 tablet twice a day by oral route as needed.    . DULoxetine (CYMBALTA) 60 MG capsule TAKE (1) CAPSULE BY MOUTH TWICE DAILY. 60 capsule 5  . EPINEPHrine (EPI-PEN) 0.3 mg/0.3 mL SOAJ injection     . ergocalciferol (VITAMIN D2) 1.25 MG (50000 UT) capsule Vitamin D2 1,250 mcg (50,000 unit) capsule  Take 1 capsule every month by oral route.    . gabapentin (NEURONTIN) 300 MG capsule   1  . hydrOXYzine (ATARAX/VISTARIL) 25 MG tablet TAKE 1 TABLET BY MOUTH ONCE DAILY AS NEEDED FOR ANXIETY 30 tablet 2  . LINZESS 72 MCG capsule     . lisdexamfetamine (VYVANSE) 70 MG capsule Take 1 capsule (70 mg total) by mouth daily. 30 capsule 0  . lisdexamfetamine (VYVANSE) 70 MG capsule Take 1 capsule (70 mg total) by mouth daily. 30 capsule 0  . lisdexamfetamine (VYVANSE) 70 MG capsule Take 1 capsule (70 mg total) by mouth daily. 30 capsule 0  . meclizine (ANTIVERT) 25 MG tablet meclizine 25 mg tablet    . nitrofurantoin, macrocrystal-monohydrate, (MACROBID) 100 MG capsule nitrofurantoin monohydrate/macrocrystals 100 mg capsule    . ondansetron (ZOFRAN) 4 MG tablet ondansetron HCl 4 mg tablet    . pantoprazole (PROTONIX) 40 MG tablet Take 40 mg by mouth 2 (two) times daily.     Marland Kitchen Plecanatide 3 MG TABS Take by mouth.    . traMADol (ULTRAM) 50 MG tablet  tramadol 50 mg tablet  Take 2 tablets twice a day by oral route as needed.  Take 1-2 twice a day as needed    . traZODone (DESYREL) 100 MG tablet Take 2 tablets (200 mg total) by mouth at bedtime. 60 tablet 2   No current facility-administered medications for this visit.     Musculoskeletal: Strength & Muscle Tone: within normal limits Gait & Station: normal Patient leans: N/A  Psychiatric Specialty Exam: Review of Systems  Musculoskeletal: Positive for back pain.  All other systems reviewed and are negative.   There were no vitals taken for this visit.There is no height or weight on file to calculate BMI.  General Appearance: NA  Eye Contact:  NA  Speech:  Clear and Coherent  Volume:  Normal  Mood:  Euthymic  Affect:  NA  Thought Process:  Goal Directed  Orientation:  Full (Time, Place, and Person)  Thought Content: Rumination   Suicidal Thoughts:  No  Homicidal Thoughts:  No  Memory:  Immediate;   Good Recent;   Good Remote;   Good  Judgement:  Good  Insight:  Good  Psychomotor Activity:  Decreased  Concentration:  Concentration: Good and Attention Span: Good  Recall:  Good  Fund of Knowledge: Good  Language: Good  Akathisia:  No  Handed:  Right  AIMS (if indicated): not done  Assets:  Communication Skills Desire for Improvement Resilience Social Support Talents/Skills  ADL's:  Intact  Cognition: WNL  Sleep:  Good   Screenings: PHQ2-9   Flowsheet Row Video Visit from 09/04/2020 in [facility]  PHQ-2 Total Score 5  PHQ-9 Total Score 18    Flowsheet Row Video Visit from 09/04/2020 in [facility] No Risk       Assessment and Plan: This patient is a 60 year old female with a history of depression ADD and insomnia.  She feels better now that she has been off work and is preparing for back surgery.  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  For now she will continue Cymbalta 60 mg twice daily for depression and chronic pain, Vyvanse 70 mg every morning for focus, trazodone 200 mg at bedtime for sleep and hydroxyzine 25 mg daily only if needed for anxiety.  She will return to see me in 85-month   Laurie Spiller, MD 10/07/2020, 9:46 AM

## 2020-10-10 DIAGNOSIS — M5126 Other intervertebral disc displacement, lumbar region: Secondary | ICD-10-CM | POA: Diagnosis not present

## 2020-10-10 DIAGNOSIS — M5116 Intervertebral disc disorders with radiculopathy, lumbar region: Secondary | ICD-10-CM | POA: Diagnosis not present

## 2020-10-10 DIAGNOSIS — M4726 Other spondylosis with radiculopathy, lumbar region: Secondary | ICD-10-CM | POA: Diagnosis not present

## 2020-10-10 HISTORY — PX: BACK SURGERY: SHX140

## 2020-10-20 ENCOUNTER — Encounter (HOSPITAL_COMMUNITY): Payer: Self-pay | Admitting: Psychiatry

## 2020-10-20 ENCOUNTER — Other Ambulatory Visit: Payer: Self-pay

## 2020-10-20 ENCOUNTER — Ambulatory Visit (INDEPENDENT_AMBULATORY_CARE_PROVIDER_SITE_OTHER): Payer: BC Managed Care – PPO | Admitting: Psychiatry

## 2020-10-20 DIAGNOSIS — F321 Major depressive disorder, single episode, moderate: Secondary | ICD-10-CM | POA: Diagnosis not present

## 2020-10-20 NOTE — Progress Notes (Signed)
Virtual Visit via Video Note  I connected with Laurie Reyes on 10/20/20 at 10:00 AM EDT by a video enabled telemedicine application and verified that I am speaking with the correct person using two identifiers.  Location: Patient: Home Provider: [place] office    I discussed the limitations of evaluation and management by telemedicine and the availability of in person appointments. The patient expressed understanding and agreed to proceed.  I provided 60 minutes of non-face-to-face time during this encounter.   Alonza Smoker, LCSW    Comprehensive Clinical Assessment (CCA) Note  10/20/2020 Laurie Reyes [id number]  Chief Complaint:  Chief Complaint  Patient presents with  . Depression  . Stress   Visit Diagnosis: MDD, Moderate    Patient Determined To Be At Risk for Harm To Self or Others Based on Review of Patient Reported Information or Presenting Complaint? No (Pt denies SI/HI/SIB, no hx of suicide attempts, violence, no fam hx of suicide, homicide, violence, no guns or weapons in the home.)    CCA Biopsychosocial Intake/Chief Complaint:  "Deep depression and stress, cry all the time, feeling hopeless. This started in October 2021. I have a lot of stress from my job. They closed my office in October 2021 and assigned me to [facility]. The transition was difficult as it was totally different from what I was doing. They weren't willing to train me. I was out from November due to January due to back issues aggravated by the sitting. I didn't get the training when I returned to work. I felt like they were trying to push me out the door. I left beginning of April on medical leave in preparation for back surgery"  Current Symptoms/Problems: crying all the time, hopeless,   Patient Reported Schizophrenia/Schizoaffective Diagnosis in Past: No   Strengths: desire for improvement,  Preferences: Inidvidual therapy  Abilities: No data  recorded  Type of Services Patient Feels are Needed: " I want assurance of what I am going through is not me, that I am not incapable"   Initial Clinical Notes/Concerns: Pt. is referred for services by psychiatrist Dr. Harrington Challenger due to pt experiencing symptoms of depression. She denies any psychiatric hospitalizations. She was seen once by this clinician.   Mental Health Symptoms Depression:  Difficulty Concentrating; Fatigue; Hopelessness; Weight gain/loss; Tearfulness; Irritability; Sleep (too much or little)   Duration of Depressive symptoms: Greater than two weeks   Mania:  No data recorded  Anxiety:   Tension; Worrying; Fatigue; Difficulty concentrating; Irritability; Sleep; Restlessness   Psychosis:  None   Duration of Psychotic symptoms: No data recorded  Trauma:  No data recorded  Obsessions:  None   Compulsions:  No data recorded  Inattention:  Forgetful   Hyperactivity/Impulsivity:  No data recorded  Oppositional/Defiant Behaviors:  No data recorded  Emotional Irregularity:  No data recorded  Other Mood/Personality Symptoms:  No data recorded   Mental Status Exam Appearance and self-care  Stature:  No data recorded  Weight:  Average weight   Clothing:  Casual   Grooming:  Normal   Cosmetic use:  None   Posture/gait:  No data recorded  Motor activity:  No data recorded  Sensorium  Attention:  Normal   Concentration:  Normal   Orientation:  X5   Recall/memory:  Normal   Affect and Mood  Affect:  Depressed; Anxious   Mood:  Depressed   Relating  Eye contact:  No data recorded  Facial expression:  Responsive   Attitude toward examiner:  Cooperative   Thought and Language  Speech flow: Normal   Thought content:  Appropriate to Mood and Circumstances   Preoccupation:  Ruminations   Hallucinations:  No data recorded  Organization:  No data recorded  [employer] of Knowledge:  Average   Intelligence:  Average   Abstraction:   Normal   Judgement:  Good   Reality Testing:  Realistic   Insight:  Good   Decision Making:  Normal   Social Functioning  Social Maturity:  Isolates   Social Judgement:  Normal   Stress  Stressors:  Work   Coping Ability:  Programme researcher, broadcasting/film/video Deficits:  None   Supports:  Friends/Service system     Religion: Religion/Spirituality Are You A Religious Person?: Yes What is Your Religious Affiliation?: Non-Denominational How Might This Affect Treatment?: No effect  Leisure/Recreation: Leisure / Recreation Do You Have Hobbies?: Yes Leisure and Hobbies: reading  Exercise/Diet: Exercise/Diet Do You Exercise?: Yes What Type of Exercise Do You Do?: Run/Walk How Many Times a Week Do You Exercise?: Daily Have You Gained or Lost A Significant Amount of Weight in the Past Six Months?: Yes-Gained Number of Pounds Gained: 17 Do You Follow a Special Diet?: No Do You Have Any Trouble Sleeping?: Yes Explanation of Sleeping Difficulties: excesive sleeping   CCA Employment/Education Employment/Work Situation: Employment / Work Situation Employment situation: Leave of absence (medical leave due to back surgery  still works for [employer].) What is the longest time patient has a held a job?: 16 years 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, 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[place], [place] Has patient ever been in the [employer]?: No  Education: Education Is Patient Currently Attending School?: No Did Teacher, adult education From [employer]?: Yes Did Physicist, medical?: Yes What Type of College Degree Do you Have?: BS Criminal Justice from  [place], Did [employer] Attend Graduate School?: Yes What is Your Press photographer?: Master's Degree i n Financial controller from [employer] Did You Have Any Special Interests In School?: none Did You Have An Individualized Education Program (IIEP): No Did You Have Any Difficulty At [employer]?: No Patient's  Education Has Been Impacted by Current Illness: No   CCA Family/Childhood History Family and Relationship History: Family history Marital status: Married Number of Years Married: 23 What types of issues is patient dealing with in the relationship?: get along well Are you sexually active?: Yes Does patient have children?: Yes How many children?: 2 (70 yo son, 64 yo daughter) How is patient's relationship with their children?: rocky relationship with son, ( pt takes care of his son and he doesn't do anything), good relationship with daughter  Childhood History:  Childhood History By whom was/is the patient raised?: Mother Additional childhood history information: Pt was born in [place], [place], reared in [place] and [place] Description of patient's relationship with caregiver when they were a child: " I adored my mother" Patient's description of current relationship with people who raised him/her: "Nothing has changed" How were you disciplined when you got in trouble as a child/adolescent?: switches when a little girl, priviledges taken away Does patient have siblings?: Yes Number of Siblings: 5 Description of patient's current relationship with siblings: one is deceased, very close knit family Did patient suffer any verbal/emotional/physical/sexual abuse as a child?: No Did patient suffer from severe childhood neglect?: No Has patient ever been sexually abused/assaulted/raped as an adolescent or adult?: No Was the patient ever a victim of  a crime or a disaster?: No Witnessed domestic violence?: No Has patient been affected by domestic violence as an adult?: No  Child/Adolescent Assessment:     CCA Substance Use Alcohol/Drug Use: Alcohol / Drug Use Pain Medications: see patient record Prescriptions: see patient record Over the Counter: see patient record History of alcohol / drug use?: No history of alcohol / drug abuse                         ASAM's:  Six  Dimensions of Multidimensional Assessment  Dimension 1:  Acute Intoxication and/or Withdrawal Potential:   Dimension 1:  Description of individual's past and current experiences of substance use and withdrawal: None  Dimension 2:  Biomedical Conditions and Complications:      Dimension 3:  Emotional, Behavioral, or Cognitive Conditions and Complications:    Dimension 4:  Readiness to Change:    Dimension 5:  Relapse, Continued use, or Continued Problem Potential:    Dimension 6:  Recovery/Living Environment:    ASAM Severity Score: ASAM's Severity Rating Score: 0  ASAM Recommended Level of Treatment:     Substance use Disorder (SUD)   Recommendations for Services/Supports/Treatments: Recommendations for Services/Supports/Treatments Recommendations For Services/Supports/Treatments: Individual Therapy,Medication Management  /Patient  attends assessment appointment today.  Confidentiality and limits were discussed.  Nutritional assessment, pain assessment, PHQ 2 and 9 with C-SS RS administered.  Patient agrees to return for an appointment in 2 weeks.  She will continue to see psychiatrist Dr. Harrington Challenger for medication management.  She agrees to call this practice, call 911, I have someone take her to the ER should symptoms worsen individual therapy is recommended 1 time over 1 to 4 weeks to improve coping skills to overcome depression, resume normal interest in activities, and elevate mood.  DSM5 Diagnoses: Patient Active Problem List   Diagnosis Date Noted  . ADD (attention deficit disorder) 04/06/2013  . SINUS PAIN 02/25/2009    Patient Centered Plan: Patient is on the following Treatment Plan(s): Developed next session   Referrals to Alternative Service(s): Referred to Alternative Service(s):   Place:   Date:   Time:    Referred to Alternative Service(s):   Place:   Date:   Time:    Referred to Alternative Service(s):   Place:   Date:   Time:    Referred to Alternative Service(s):    Place:   Date:   Time:     Alonza Smoker, LCSW

## 2020-10-27 ENCOUNTER — Telehealth (HOSPITAL_COMMUNITY): Payer: Self-pay | Admitting: Psychiatry

## 2020-11-05 ENCOUNTER — Ambulatory Visit (INDEPENDENT_AMBULATORY_CARE_PROVIDER_SITE_OTHER): Payer: BC Managed Care – PPO | Admitting: Psychiatry

## 2020-11-05 ENCOUNTER — Other Ambulatory Visit: Payer: Self-pay

## 2020-11-05 DIAGNOSIS — F900 Attention-deficit hyperactivity disorder, predominantly inattentive type: Secondary | ICD-10-CM | POA: Diagnosis not present

## 2020-11-05 DIAGNOSIS — F321 Major depressive disorder, single episode, moderate: Secondary | ICD-10-CM | POA: Diagnosis not present

## 2020-11-05 NOTE — Progress Notes (Signed)
Virtual Visit via Video Note  I connected with Laurie Reyes on 11/05/20 at 10:12 AM EDT  by a video enabled telemedicine application and verified that I am speaking with the correct person using two identifiers.  Location: Patient: Home Provider: [place] office   I discussed the limitations of evaluation and management by telemedicine and the availability of in person appointments. The patient expressed understanding and agreed to proceed.  I provided 43 minutes of non-face-to-face time during this encounter.   Laurie Smoker, LCSW    THERAPIST PROGRESS NOTE  Session Time: Wednesday 11/05/2020 10:12 AM - 10:55 AM  Participation Level: Active  Behavioral Response: CasualAlertDepressed  Type of Therapy: Individual Therapy  Treatment Goals addressed: Elevate mood and show evidence of usual energy, activities, and socialization level AEB pt socializing with family and friends and initiating phone contact 3 times a week for 3 consecutive weeks per patient's report  Interventions: CBT and Supportive  Summary: Laurie Reyes is a 60 y.o. female referred for services by psychiatrist Dr. Harrington Challenger due to patient experiencing symptoms of depression.  She denies any psychiatric hospitalizations.  She has  been seen once by this clinician.  Patient states experiencing deep depression and stress, crying all the time, and feeling hopeless.  This started in October 2021 when the office where she were closed and patient was reassigned to another location.  She reports the transition was difficult and staff were not willing to train her  to do her new responsibilities.  She experienced back issues and was out of work  from November 20 to January 2022.  She still did not receive any more training when she returned to work.  She has been on medical leave since April 2022 due to back surgery.  Patient last was seen via virtual visit for assessment appointment about 2 to 3  weeks ago.  Patient continues to experience symptoms of depression.  She reports increased behavioral activation  including walking to help heal her back.  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  She reports positive morning routine.  She reports attending church this past Sunday and enjoyed this very much.  She still is avoiding deeper interaction and socialization with family and friends.  She also tends to stay at home most of the time and does not visit family and friends or go on hours to the park like she used to do.  She continues to experience fatigue, poor appetite, worry, tearfulness, depressed mood, loss of libido, and doubts about self and capabilities   Suicidal/Homicidal: Nowithout intent/plan  Therapist Response: Reviewed symptoms, praised and reinforced patient's efforts to increase behavioral activation, discussed effects discussed stressors, facilitated expression of thoughts and feelings, validated feelings,  assisted patient develop treatment plan, obtained patient's permission to initial plan as this was a virtual visit, discussed the role of self-care incoping with depression, developed plan with patient to eat 3 meals a day as she is skipping meals  Plan: Return again in 2 weeks.  Diagnosis: Axis I:MDD        Laurie Smoker, LCSW 11/05/2020

## 2020-11-18 DIAGNOSIS — M545 Low back pain, unspecified: Secondary | ICD-10-CM | POA: Diagnosis not present

## 2020-11-18 DIAGNOSIS — M542 Cervicalgia: Secondary | ICD-10-CM | POA: Diagnosis not present

## 2020-11-18 DIAGNOSIS — M5416 Radiculopathy, lumbar region: Secondary | ICD-10-CM | POA: Diagnosis not present

## 2020-11-18 DIAGNOSIS — I10 Essential (primary) hypertension: Secondary | ICD-10-CM | POA: Diagnosis not present

## 2020-11-19 ENCOUNTER — Ambulatory Visit (INDEPENDENT_AMBULATORY_CARE_PROVIDER_SITE_OTHER): Payer: BC Managed Care – PPO | Admitting: Psychiatry

## 2020-11-19 ENCOUNTER — Other Ambulatory Visit: Payer: Self-pay

## 2020-11-19 DIAGNOSIS — F321 Major depressive disorder, single episode, moderate: Secondary | ICD-10-CM

## 2020-11-19 NOTE — Progress Notes (Signed)
Virtual Visit via Telephone Note  I connected with Laurie Reyes on 11/19/20 at 9:15 AM EDT  by telephone and verified that I am speaking with the correct person using two identifiers.  Location: Patient: Home Provider: [place] office    I discussed the limitations, risks, security and privacy concerns of performing an evaluation and management service by telephone and the availability of in person appointments. I also discussed with the patient that there may be a patient responsible charge related to this service. The patient expressed understanding and agreed to proceed.    I provided 45 minutes of non-face-to-face time during this encounter.   Alonza Smoker, LCSW   THERAPIST PROGRESS NOTE  Session Time: Wednesday 11/19/2020 9:15 AM - 10:00 AM   Participation Level: Active  Behavioral Response: CasualAlertDepressed  Type of Therapy: Individual Therapy  Treatment Goals addressed: Elevate mood and show evidence of usual energy, activities, and socialization level AEB pt socializing with family and friends and initiating phone contact 3 times a week for 3 consecutive weeks per patient's report  Interventions: CBT and Supportive  Summary: Laurie Reyes is a 60 y.o. female referred for services by psychiatrist Dr. Harrington Challenger due to patient experiencing symptoms of depression.  She denies any psychiatric hospitalizations.  She has  been seen once by this clinician.  Patient states experiencing deep depression and stress, crying all the time, and feeling hopeless.  This started in October 2021 when the office where she were closed and patient was reassigned to another location.  She reports the transition was difficult and staff were not willing to train her  to do her new responsibilities.  She experienced back issues and was out of work  from November 20 to January 2022.  She still did not receive any more training when she returned to work.  She has been on  medical leave since April 2022 due to back surgery.  Patient last was seen via virtual visit for assessment appointment about 2 to 3 weeks ago.  Patient reports feeling better since last session.  She has improved self-care regarding eating.  She reports increased energy, improved mood, and increased behavioral activation.  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  She reports talking on the phone to her sister and her mother daily.  She reports she actually visited her mother's home this past weekend and enjoyed socializing with mother sitting on the porch.  Patient reports decreased self blame regarding her job and states situation on  job happened for a reason.  She has been using spirituality and prayer which have been helpful.  Suicidal/Homicidal: Nowithout intent/plan  Therapist Response: Reviewed symptoms, praised and reinforced patient's efforts to improve self-care/increase behavioral activation/socialization, discussed effects, developed plan with patient to use daily planning to help maintain consistent efforts, oriented patient to CBT and assisted patient identify the connection between thoughts/mood/and behavior, assisted patient identify ways to use her spirituality/daily affirmations to replace unhelpful thoughts with helpful thoughts, facilitated expression of thoughts and feelings about situation she experienced on her job, validated and normalized feelings related to grief and loss regarding job,  Plan: Return again in 2 weeks.  Diagnosis: Axis I:MDD        Alonza Smoker, LCSW 11/19/2020

## 2020-12-03 ENCOUNTER — Ambulatory Visit (INDEPENDENT_AMBULATORY_CARE_PROVIDER_SITE_OTHER): Payer: BC Managed Care – PPO | Admitting: Psychiatry

## 2020-12-03 ENCOUNTER — Other Ambulatory Visit: Payer: Self-pay

## 2020-12-03 DIAGNOSIS — F321 Major depressive disorder, single episode, moderate: Secondary | ICD-10-CM

## 2020-12-03 NOTE — Progress Notes (Signed)
Virtual Visit via Video Note  I connected with Adalyna Godbee on 12/03/20 at 10:05 AM EDT by a video enabled telemedicine application and verified that I am speaking with the correct person using two identifiers.  Location: Patient: Home Provider: [place] office    I discussed the limitations of evaluation and management by telemedicine and the availability of in person appointments. The patient expressed understanding and agreed to proceed.   I provided  50 minutes of non-face-to-face time during this encounter.   Alonza Smoker, LCSW    THERAPIST PROGRESS NOTE  Session Time: Wednesday 11/23/2020 10:05 AM - 10:55 AM   Participation Level: Active  Behavioral Response: CasualAlertDepressed  Type of Therapy: Individual Therapy  Treatment Goals addressed: Elevate mood and show evidence of usual energy, activities, and socialization level AEB pt socializing with family and friends and initiating phone contact 3 times a week for 3 consecutive weeks per patient's report  Interventions: CBT and Supportive  Summary: Conny Situ is a 60 y.o. female referred for services by psychiatrist Dr. Harrington Challenger due to patient experiencing symptoms of depression.  She denies any psychiatric hospitalizations.  She has  been seen once by this clinician.  Patient states experiencing deep depression and stress, crying all the time, and feeling hopeless.  This started in October 2021 when the office where she were closed and patient was reassigned to another location.  She reports the transition was difficult and staff were not willing to train her  to do her new responsibilities.  She experienced back issues and was out of work  from November 20 to January 2022.  She still did not receive any more training when she returned to work.  She has been on medical leave since April 2022 due to back surgery.  Patient last was seen via virtual visit about 2 to 3 weeks ago.  Patient  reports decreased symptoms of depression but increased stress and anxiety.  She has maintain consistent use of daily planning and involvement in activities.  She has increased socialization and reports recently enjoying attending a retirement party for a friend.  He also reports increased social involvement with her mother and recently enjoying visiting her sister in [place].  She also states going to a couple of stores with her mother and sister.  Patient reports starting to feel a little like her old self regarding socialization.  She continues her job search efforts but expresses discouragement she has not yet found a job.  She reports increased ruminating thoughts and anxiety about possibly having to return to her current job.  She anticipates she will probably be out on medical leave until August 1.   Suicidal/Homicidal: Nowithout intent/plan  Therapist Response: Reviewed symptoms, praised and reinforced patient's efforts to maintain consistent behavioral activation/socialization, discussed effects, developed plan with patient to continue using daily planning, discussed stressors, facilitated expression of thoughts and feelings, validated feelings, discussed rationale for/provided instructions for/and developed plan with patient to designate a 15-minute daily worry time, discussed rationale for and assisted patient practice mindfulness activity (leaves on a stream) to cope with ruminating thoughts, will send patient handouts via mail Plan: Return again in 2 weeks.  Diagnosis: Axis I:MDD        Alonza Smoker, LCSW 12/03/2020

## 2020-12-05 ENCOUNTER — Telehealth (HOSPITAL_COMMUNITY): Payer: BC Managed Care – PPO | Admitting: Psychiatry

## 2020-12-05 DIAGNOSIS — N951 Menopausal and female climacteric states: Secondary | ICD-10-CM | POA: Diagnosis not present

## 2020-12-05 DIAGNOSIS — N841 Polyp of cervix uteri: Secondary | ICD-10-CM | POA: Diagnosis not present

## 2020-12-05 DIAGNOSIS — Z01419 Encounter for gynecological examination (general) (routine) without abnormal findings: Secondary | ICD-10-CM | POA: Diagnosis not present

## 2020-12-08 ENCOUNTER — Telehealth (INDEPENDENT_AMBULATORY_CARE_PROVIDER_SITE_OTHER): Payer: BC Managed Care – PPO | Admitting: Psychiatry

## 2020-12-08 ENCOUNTER — Encounter (HOSPITAL_COMMUNITY): Payer: Self-pay | Admitting: Psychiatry

## 2020-12-08 ENCOUNTER — Other Ambulatory Visit: Payer: Self-pay

## 2020-12-08 DIAGNOSIS — F900 Attention-deficit hyperactivity disorder, predominantly inattentive type: Secondary | ICD-10-CM | POA: Diagnosis not present

## 2020-12-08 DIAGNOSIS — F321 Major depressive disorder, single episode, moderate: Secondary | ICD-10-CM

## 2020-12-08 MED ORDER — LISDEXAMFETAMINE DIMESYLATE 70 MG PO CAPS: 70.0000 mg | ORAL_CAPSULE | Freq: Every day | ORAL | 0 refills | Status: DC

## 2020-12-08 MED ORDER — TRAZODONE HCL 100 MG PO TABS: 200.0000 mg | ORAL_TABLET | Freq: Every day | ORAL | 2 refills | Status: AC

## 2020-12-08 MED ORDER — DULOXETINE HCL 60 MG PO CPEP: ORAL_CAPSULE | ORAL | 5 refills | Status: AC

## 2020-12-08 MED ORDER — HYDROXYZINE HCL 25 MG PO TABS: ORAL_TABLET | ORAL | 2 refills | Status: DC

## 2020-12-08 NOTE — Progress Notes (Signed)
Virtual Visit via Telephone Note  I connected with Mauriana Dann on 12/08/20 at 11:20 AM EDT by telephone and verified that I am speaking with the correct person using two identifiers.  Location: Patient: home Provider: home office   I discussed the limitations, risks, security and privacy concerns of performing an evaluation and management service by telephone and the availability of in person appointments. I also discussed with the patient that there may be a patient responsible charge related to this service. The patient expressed understanding and agreed to proceed.      I discussed the assessment and treatment plan with the patient. The patient was provided an opportunity to ask questions and all were answered. The patient agreed with the plan and demonstrated an understanding of the instructions.   The patient was advised to call back or seek an in-person evaluation if the symptoms worsen or if the condition fails to improve as anticipated.  I provided 15 minutes of non-face-to-face time during this encounter.   Levonne Spiller, MD  [facility] MD/PA/NP OP Progress Note  12/08/2020 11:37 AM Lacreasha Hinds  MRN:  [id number]  Chief Complaint:  Chief Complaint   Anxiety; Depression; Follow-up    HPI: This patient is a 60 year old married black female who lives with her husband in [place].  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  The patient returns for follow-up after 2 months.  She did have back surgery on April 21.  The surgery has been successful and she is in less pain and is able to walk several times a day.  She is very apprehensive however about going back to work.  She states that she felt mistreated there and she was extremely stressed.  She is supposed to see her surgeon on July 6 and she thinks he will clear her to return to work shortly thereafter   There is a possibility that she is going to be moved to  another position.  This was probably for the past.  She will not know until she goes back.  In the interim she states while at home she is feeling good she denies significant depression or anxiety but states is this starts up again as soon as she goes back to this job.  She is not sleeping as well and has had to take 200 mg of trazodone.  She denies any thoughts of self-harm or suicide. Visit Diagnosis:    ICD-10-CM   1. Current moderate episode of major depressive disorder, unspecified whether recurrent ([place])  F32.1     2. Attention deficit hyperactivity disorder (ADHD), predominantly inattentive type  F90.0       Past Psychiatric History: Previous outpatient treatment in our office  Past Medical History:  Past Medical History:  Diagnosis Date   ADHD (attention deficit hyperactivity disorder)    Depression    Gastric reflux    Hot flashes, menopausal     Past Surgical History:  Procedure Laterality Date   BACK SURGERY  10/10/2020   NASAL SINUS SURGERY     SHOULDER SURGERY      Family Psychiatric History: see below  Family History:  Family History  Problem Relation Age of Onset   ADD / ADHD Other    ADD / ADHD Other    Alcohol abuse Father     Social History:  Social History   Socioeconomic History   Marital status: Married    Spouse name: Not on file  Number of children: Not on file   Years of education: Not on file   Highest education level: Not on file  Occupational History   Not on file  Tobacco Use   Smoking status: Former    Pack years: 0.00   Smokeless tobacco: Never  Substance and Sexual Activity   Alcohol use: No   Drug use: No   Sexual activity: Yes  Other Topics Concern   Not on file  Social History Narrative   Not on file   Social Determinants of Health   Financial Resource Strain: Not on file  Food Insecurity: Not on file  Transportation Needs: Not on file  Physical Activity: Not on file  Stress: Not on file  Social Connections: Not on  file    Allergies:  Allergies  Allergen Reactions   Penicillins Shortness Of Breath and Rash   Shellfish Allergy Anaphylaxis and Shortness Of Breath   Sulfa Antibiotics Shortness Of Breath and Rash    Metabolic Disorder Labs: No results found for: HGBA1C, MPG No results found for: PROLACTIN No results found for: CHOL, TRIG, HDL, CHOLHDL, VLDL, LDLCALC No results found for: TSH  Therapeutic Level Labs: No results found for: LITHIUM No results found for: VALPROATE No components found for:  CBMZ  Current Medications: Current Outpatient Medications  Medication Sig Dispense Refill   amLODipine (NORVASC) 5 MG tablet Take 5 mg by mouth daily.     amLODipine-atorvastatin (CADUET) 5-10 MG tablet Take 1 tablet by mouth daily. (Patient not taking: Reported on 10/20/2020)     baclofen (LIORESAL) 10 MG tablet baclofen 10 mg tablet (Patient not taking: Reported on 10/20/2020)     cyclobenzaprine (FLEXERIL) 10 MG tablet cyclobenzaprine 10 mg tablet  Take 1 tablet twice a day by oral route as needed. (Patient not taking: Reported on 10/20/2020)     DULoxetine (CYMBALTA) 60 MG capsule TAKE (1) CAPSULE BY MOUTH TWICE DAILY. 60 capsule 5   EPINEPHrine (EPI-PEN) 0.3 mg/0.3 mL SOAJ injection      ergocalciferol (VITAMIN D2) 1.25 MG (50000 UT) capsule Vitamin D2 1,250 mcg (50,000 unit) capsule  Take 1 capsule every month by oral route. (Patient not taking: Reported on 10/20/2020)     gabapentin (NEURONTIN) 300 MG capsule   1   hydrOXYzine (ATARAX/VISTARIL) 25 MG tablet TAKE 1 TABLET BY MOUTH ONCE DAILY AS NEEDED FOR ANXIETY 30 tablet 2   LINZESS 72 MCG capsule  (Patient not taking: Reported on 10/20/2020)     lisdexamfetamine (VYVANSE) 70 MG capsule Take 1 capsule (70 mg total) by mouth daily. 30 capsule 0   lisdexamfetamine (VYVANSE) 70 MG capsule Take 1 capsule (70 mg total) by mouth daily. 30 capsule 0   lisdexamfetamine (VYVANSE) 70 MG capsule Take 1 capsule (70 mg total) by mouth daily. 30 capsule 0    meclizine (ANTIVERT) 25 MG tablet meclizine 25 mg tablet (Patient not taking: Reported on 10/20/2020)     nitrofurantoin, macrocrystal-monohydrate, (MACROBID) 100 MG capsule nitrofurantoin monohydrate/macrocrystals 100 mg capsule (Patient not taking: Reported on 10/20/2020)     ondansetron (ZOFRAN) 4 MG tablet ondansetron HCl 4 mg tablet (Patient not taking: Reported on 10/20/2020)     pantoprazole (PROTONIX) 40 MG tablet Take 40 mg by mouth 2 (two) times daily.     Plecanatide 3 MG TABS Take by mouth. (Patient not taking: Reported on 10/20/2020)     traMADol (ULTRAM) 50 MG tablet tramadol 50 mg tablet  Take 2 tablets twice a day by oral route as needed.  Take 1-2 twice a day as needed     traZODone (DESYREL) 100 MG tablet Take 2 tablets (200 mg total) by mouth at bedtime. 60 tablet 2   No current facility-administered medications for this visit.     Musculoskeletal: Strength & Muscle Tone: decreased Gait & Station: normal Patient leans: N/A  Psychiatric Specialty Exam: Review of Systems  Musculoskeletal:  Positive for back pain.  Psychiatric/Behavioral:  The patient is nervous/anxious.   All other systems reviewed and are negative.  There were no vitals taken for this visit.There is no height or weight on file to calculate BMI.  General Appearance: NA  Eye Contact:  NA  Speech:  Clear and Coherent  Volume:  Normal  Mood:  Anxious and Euthymic  Affect:  NA  Thought Process:  Goal Directed  Orientation:  Full (Time, Place, and Person)  Thought Content: Rumination   Suicidal Thoughts:  No  Homicidal Thoughts:  No  Memory:  Immediate;   Good Recent;   Good Remote;   Good  Judgement:  Good  Insight:  Good  Psychomotor Activity:  Decreased  Concentration:  Concentration: Good and Attention Span: Good  Recall:  Good  Fund of Knowledge: Good  Language: Good  Akathisia:  No  Handed:  Right  AIMS (if indicated): not done  Assets:  Communication Skills Desire for  Improvement Resilience Social Support Talents/Skills  ADL's:  Intact  Cognition: WNL  Sleep:  Fair   Screenings: PHQ2-9    Flowsheet Row Video Visit from 12/08/2020 in [facility] from 10/20/2020 in [facility] Video Visit from 09/04/2020 in [facility]  PHQ-2 Total Score 1 6 5   PHQ-9 Total Score 6 23 18       Flowsheet Row Video Visit from 12/08/2020 in [facility] from 10/20/2020 in [facility] Video Visit from 09/04/2020 in [facility] No Risk No Risk No Risk        Assessment and Plan: This patient is a 60 year old female with a history of depression ADD and insomnia.  She finds her job extremely stressful but feels better now that she is out of work for a while.  Only time will tell how she is going to do when she goes back.  She just feels like she does not have much choice other than to apply for long-term disability.  She is at least going to go back and try.  For now she will continue Cymbalta 60 mg twice daily for depression and chronic pain, Vyvanse 70 mg every day for focus, trazodone 200 mg at bedtime for sleep and hydroxyzine 25 mg daily if needed for anxiety.  She will return to see me in 6 weeks   Levonne Spiller, MD 12/08/2020, 11:37 AM

## 2020-12-17 ENCOUNTER — Other Ambulatory Visit: Payer: Self-pay

## 2020-12-17 ENCOUNTER — Ambulatory Visit (INDEPENDENT_AMBULATORY_CARE_PROVIDER_SITE_OTHER): Payer: BC Managed Care – PPO | Admitting: Psychiatry

## 2020-12-17 DIAGNOSIS — F900 Attention-deficit hyperactivity disorder, predominantly inattentive type: Secondary | ICD-10-CM

## 2020-12-17 DIAGNOSIS — F321 Major depressive disorder, single episode, moderate: Secondary | ICD-10-CM | POA: Diagnosis not present

## 2020-12-17 NOTE — Progress Notes (Signed)
Virtual Visit via Video Note  I connected with Laurie Reyes on 12/17/20 at 2:15 PM EDT  by a video enabled telemedicine application and verified that I am speaking with the correct person using two identifiers.  Location: Patient: Home Provider: [place] office    I discussed the limitations of evaluation and management by telemedicine and the availability of in person appointments. The patient expressed understanding and agreed to proceed.   I provided 40 minutes of non-face-to-face time during this encounter.   Alonza Smoker, LCSW   THERAPIST PROGRESS NOTE  Session Time: Wednesday 12/17/2020 2:15 PM - 2:55 PM  Participation Level: Active  Behavioral Response: CasualAlertDepressed  Type of Therapy: Individual Therapy  Treatment Goals addressed: Elevate mood and show evidence of usual energy, activities, and socialization level AEB pt socializing with family and friends and initiating phone contact 3 times a week for 3 consecutive weeks per patient's report  Interventions: CBT and Supportive  Summary: Laurie Reyes is a 60 y.o. female referred for services by psychiatrist Dr. Harrington Challenger due to patient experiencing symptoms of depression.  She denies any psychiatric hospitalizations.  She has  been seen once by this clinician.  Patient states experiencing deep depression and stress, crying all the time, and feeling hopeless.  This started in October 2021 when the office where she were closed and patient was reassigned to another location.  She reports the transition was difficult and staff were not willing to train her  to do her new responsibilities.  She experienced back issues and was out of work  from November 20 to January 2022.  She still did not receive any more training when she returned to work.  She has been on medical leave since April 2022 due to back surgery.  Patient last was seen via virtual visit about 2 to 3 weeks ago.  Patient reports  continued decreased intensity/frequency of symptoms of depression and anxiety.  She reports continuing to cope better with anxiety.  She reports implementing designated worry time and practicing mindfulness activity to cope with ruminating thoughts.  She reports this has been helpful in  coping with thoughts of possibly having to return to her job.  She reports being able to let go of ruminating thoughts/stressful feelings and expresses acceptance of this should she have to return  She has continued her job search efforts and is excited about an interview she had earlier this week.  She is optimistic about possibly getting the job. She has maintained consistent use of daily planning and involvement in activities.  She has been attending church regularly and expresses excitement about attending a church social event tonight.  She continues to socialize with family and friends and reports attending a family function this past weekend.    Suicidal/Homicidal: Nowithout intent/plan  Therapist Response: Reviewed symptoms, praised and reinforced patient implementing daily worry time and practicing mindfulness activity, discussed effects, praised and reinforced efforts to maintain consistent behavioral activation/socialization, developed plan with patient to continue using daily planning, discussed stressors, facilitated expression of thoughts and feelings, validated feelings, discussed patient's progress in treatment, began to discuss focusing on relapse prevention strategies, will send patient handout on early warning signs of depression, developed plan with patient to complete handout in preparation for next session   plan: Return again in 2 weeks.  Diagnosis: Axis I:MDD        Alonza Smoker, LCSW 12/17/2020

## 2020-12-24 DIAGNOSIS — G5711 Meralgia paresthetica, right lower limb: Secondary | ICD-10-CM | POA: Diagnosis not present

## 2020-12-24 DIAGNOSIS — R55 Syncope and collapse: Secondary | ICD-10-CM | POA: Diagnosis not present

## 2020-12-24 DIAGNOSIS — R404 Transient alteration of awareness: Secondary | ICD-10-CM | POA: Diagnosis not present

## 2020-12-24 DIAGNOSIS — I10 Essential (primary) hypertension: Secondary | ICD-10-CM | POA: Diagnosis not present

## 2021-01-15 ENCOUNTER — Other Ambulatory Visit (HOSPITAL_COMMUNITY): Payer: Self-pay | Admitting: Internal Medicine

## 2021-01-15 DIAGNOSIS — Z1231 Encounter for screening mammogram for malignant neoplasm of breast: Secondary | ICD-10-CM

## 2021-01-26 ENCOUNTER — Ambulatory Visit (HOSPITAL_COMMUNITY): Payer: BC Managed Care – PPO

## 2021-01-28 DIAGNOSIS — M5126 Other intervertebral disc displacement, lumbar region: Secondary | ICD-10-CM | POA: Diagnosis not present

## 2021-01-28 DIAGNOSIS — M5416 Radiculopathy, lumbar region: Secondary | ICD-10-CM | POA: Diagnosis not present

## 2021-01-28 DIAGNOSIS — M48061 Spinal stenosis, lumbar region without neurogenic claudication: Secondary | ICD-10-CM | POA: Diagnosis not present

## 2021-02-09 ENCOUNTER — Other Ambulatory Visit: Payer: Self-pay

## 2021-02-09 ENCOUNTER — Ambulatory Visit (HOSPITAL_COMMUNITY)
Admission: RE | Admit: 2021-02-09 | Discharge: 2021-02-09 | Disposition: A | Discharge status: Home / Self Care | Payer: BC Managed Care – PPO | Source: Ambulatory Visit | Attending: Internal Medicine | Admitting: Internal Medicine

## 2021-02-09 DIAGNOSIS — Z1231 Encounter for screening mammogram for malignant neoplasm of breast: Secondary | ICD-10-CM | POA: Insufficient documentation

## 2021-04-03 DIAGNOSIS — R7303 Prediabetes: Secondary | ICD-10-CM | POA: Diagnosis not present

## 2021-04-03 DIAGNOSIS — G5711 Meralgia paresthetica, right lower limb: Secondary | ICD-10-CM | POA: Diagnosis not present

## 2021-04-03 DIAGNOSIS — K219 Gastro-esophageal reflux disease without esophagitis: Secondary | ICD-10-CM | POA: Diagnosis not present

## 2021-04-03 DIAGNOSIS — M461 Sacroiliitis, not elsewhere classified: Secondary | ICD-10-CM | POA: Diagnosis not present

## 2021-04-03 DIAGNOSIS — F330 Major depressive disorder, recurrent, mild: Secondary | ICD-10-CM | POA: Diagnosis not present

## 2021-04-03 DIAGNOSIS — Z23 Encounter for immunization: Secondary | ICD-10-CM | POA: Diagnosis not present

## 2021-04-03 DIAGNOSIS — M542 Cervicalgia: Secondary | ICD-10-CM | POA: Diagnosis not present

## 2021-04-03 DIAGNOSIS — M5416 Radiculopathy, lumbar region: Secondary | ICD-10-CM | POA: Diagnosis not present

## 2021-04-03 DIAGNOSIS — I10 Essential (primary) hypertension: Secondary | ICD-10-CM | POA: Diagnosis not present

## 2021-05-26 DIAGNOSIS — R1314 Dysphagia, pharyngoesophageal phase: Secondary | ICD-10-CM | POA: Diagnosis not present

## 2021-05-26 DIAGNOSIS — K2100 Gastro-esophageal reflux disease with esophagitis, without bleeding: Secondary | ICD-10-CM | POA: Diagnosis not present

## 2021-05-26 DIAGNOSIS — R194 Change in bowel habit: Secondary | ICD-10-CM | POA: Diagnosis not present

## 2021-05-26 DIAGNOSIS — R151 Fecal smearing: Secondary | ICD-10-CM | POA: Diagnosis not present

## 2021-07-08 DIAGNOSIS — R7303 Prediabetes: Secondary | ICD-10-CM | POA: Diagnosis not present

## 2021-07-08 DIAGNOSIS — E785 Hyperlipidemia, unspecified: Secondary | ICD-10-CM | POA: Diagnosis not present

## 2021-07-08 DIAGNOSIS — I10 Essential (primary) hypertension: Secondary | ICD-10-CM | POA: Diagnosis not present

## 2021-07-08 DIAGNOSIS — M5416 Radiculopathy, lumbar region: Secondary | ICD-10-CM | POA: Diagnosis not present

## 2021-07-30 ENCOUNTER — Other Ambulatory Visit: Payer: Self-pay | Admitting: Neurosurgery

## 2021-07-30 DIAGNOSIS — M5416 Radiculopathy, lumbar region: Secondary | ICD-10-CM

## 2021-08-14 ENCOUNTER — Encounter: Payer: Self-pay | Admitting: *Deleted

## 2021-08-16 ENCOUNTER — Ambulatory Visit
Admission: RE | Admit: 2021-08-16 | Discharge: 2021-08-16 | Disposition: A | Discharge status: Home / Self Care | Payer: BC Managed Care – PPO | Source: Ambulatory Visit | Attending: Neurosurgery | Admitting: Neurosurgery

## 2021-08-16 ENCOUNTER — Other Ambulatory Visit: Payer: Self-pay

## 2021-08-16 DIAGNOSIS — M48061 Spinal stenosis, lumbar region without neurogenic claudication: Secondary | ICD-10-CM | POA: Diagnosis not present

## 2021-08-16 DIAGNOSIS — M545 Low back pain, unspecified: Secondary | ICD-10-CM | POA: Diagnosis not present

## 2021-08-16 DIAGNOSIS — M4807 Spinal stenosis, lumbosacral region: Secondary | ICD-10-CM | POA: Diagnosis not present

## 2021-08-16 DIAGNOSIS — M5416 Radiculopathy, lumbar region: Secondary | ICD-10-CM

## 2021-08-16 DIAGNOSIS — R200 Anesthesia of skin: Secondary | ICD-10-CM | POA: Diagnosis not present

## 2021-08-17 ENCOUNTER — Ambulatory Visit
Admission: RE | Admit: 2021-08-17 | Discharge: 2021-08-17 | Disposition: A | Discharge status: Home / Self Care | Payer: BC Managed Care – PPO | Attending: Gastroenterology | Admitting: Gastroenterology

## 2021-08-17 ENCOUNTER — Encounter: Payer: Self-pay | Admitting: *Deleted

## 2021-08-17 ENCOUNTER — Ambulatory Visit: Payer: BC Managed Care – PPO | Admitting: Anesthesiology

## 2021-08-17 ENCOUNTER — Encounter
Admission: RE | Disposition: A | Discharge status: Home / Self Care | Payer: Self-pay | Source: Home / Self Care | Attending: Gastroenterology

## 2021-08-17 DIAGNOSIS — K635 Polyp of colon: Secondary | ICD-10-CM | POA: Diagnosis not present

## 2021-08-17 DIAGNOSIS — K219 Gastro-esophageal reflux disease without esophagitis: Secondary | ICD-10-CM | POA: Insufficient documentation

## 2021-08-17 DIAGNOSIS — D122 Benign neoplasm of ascending colon: Secondary | ICD-10-CM | POA: Insufficient documentation

## 2021-08-17 DIAGNOSIS — R1310 Dysphagia, unspecified: Secondary | ICD-10-CM | POA: Insufficient documentation

## 2021-08-17 DIAGNOSIS — K5900 Constipation, unspecified: Secondary | ICD-10-CM | POA: Insufficient documentation

## 2021-08-17 DIAGNOSIS — Z800 Family history of malignant neoplasm of digestive organs: Secondary | ICD-10-CM | POA: Diagnosis not present

## 2021-08-17 DIAGNOSIS — Z1211 Encounter for screening for malignant neoplasm of colon: Secondary | ICD-10-CM | POA: Diagnosis not present

## 2021-08-17 DIAGNOSIS — K449 Diaphragmatic hernia without obstruction or gangrene: Secondary | ICD-10-CM | POA: Diagnosis not present

## 2021-08-17 DIAGNOSIS — I10 Essential (primary) hypertension: Secondary | ICD-10-CM | POA: Insufficient documentation

## 2021-08-17 DIAGNOSIS — Q438 Other specified congenital malformations of intestine: Secondary | ICD-10-CM | POA: Insufficient documentation

## 2021-08-17 HISTORY — PX: ESOPHAGOGASTRODUODENOSCOPY (EGD) WITH PROPOFOL: SHX5813

## 2021-08-17 HISTORY — DX: Essential (primary) hypertension: I10

## 2021-08-17 HISTORY — PX: COLONOSCOPY WITH PROPOFOL: SHX5780

## 2021-08-17 HISTORY — DX: Dysphagia, unspecified: R13.10

## 2021-08-17 HISTORY — DX: Gastro-esophageal reflux disease without esophagitis: K21.9

## 2021-08-17 SURGERY — COLONOSCOPY WITH PROPOFOL
Anesthesia: General

## 2021-08-17 MED ORDER — SODIUM CHLORIDE 0.9 % IV SOLN
INTRAVENOUS | Status: DC
  Administered 2021-08-17: 20 mL/h via INTRAVENOUS

## 2021-08-17 MED ORDER — PROPOFOL 10 MG/ML IV BOLUS
INTRAVENOUS | Status: DC | PRN
  Administered 2021-08-17: 70 mg via INTRAVENOUS
  Administered 2021-08-17: 30 mg via INTRAVENOUS

## 2021-08-17 MED ORDER — PROPOFOL 500 MG/50ML IV EMUL
INTRAVENOUS | Status: DC | PRN
  Administered 2021-08-17: 150 ug/kg/min via INTRAVENOUS

## 2021-08-17 MED ORDER — LIDOCAINE HCL (CARDIAC) PF 100 MG/5ML IV SOSY
PREFILLED_SYRINGE | INTRAVENOUS | Status: DC | PRN
  Administered 2021-08-17: 50 mg via INTRAVENOUS

## 2021-08-17 NOTE — Anesthesia Procedure Notes (Signed)
Date/Time: 08/17/2021 8:42 AM Performed by: Johnna Acosta, CRNA Pre-anesthesia Checklist: Patient identified, Emergency Drugs available, Suction available, Patient being monitored and Timeout performed Patient Re-evaluated:Patient Re-evaluated prior to induction Oxygen Delivery Method: Nasal cannula Preoxygenation: Pre-oxygenation with 100% oxygen Induction Type: IV induction

## 2021-08-17 NOTE — Anesthesia Postprocedure Evaluation (Signed)
Anesthesia Post Note  Patient: Jeraldine Simmons-Foster  Procedure(s) Performed: COLONOSCOPY WITH PROPOFOL ESOPHAGOGASTRODUODENOSCOPY (EGD) WITH PROPOFOL  Patient location during evaluation: Endoscopy Anesthesia Type: General Level of consciousness: awake and alert Pain management: pain level controlled Vital Signs Assessment: post-procedure vital signs reviewed and stable Respiratory status: spontaneous breathing, nonlabored ventilation, respiratory function stable and patient connected to nasal cannula oxygen Cardiovascular status: blood pressure returned to baseline and stable Postop Assessment: no apparent nausea or vomiting Anesthetic complications: no   No notable events documented.   Last Vitals:  Vitals:   08/17/21 0940 08/17/21 0950  BP: 138/86 133/76  Pulse: 79 68  Resp: 18   Temp:    SpO2: 100% 100%    Last Pain:  Vitals:   08/17/21 0920  TempSrc: Temporal  PainSc:                  Precious Haws Imajean Mcdermid

## 2021-08-17 NOTE — Interval H&P Note (Signed)
History and Physical Interval Note:  08/17/2021 8:41 AM  Laurie Reyes  has presented today for surgery, with the diagnosis of [id number]) BOWEL HABIT CHANGES(R19.4),PHARYNGOESOPHAGEAL DYSPHAGIA(R13/14) FECAL SMEARING (R15.1.  The various methods of treatment have been discussed with the patient and family. After consideration of risks, benefits and other options for treatment, the patient has consented to  Procedure(s): COLONOSCOPY WITH PROPOFOL (N/A) ESOPHAGOGASTRODUODENOSCOPY (EGD) WITH PROPOFOL (N/A) as a surgical intervention.  The patient's history has been reviewed, patient examined, no change in status, stable for surgery.  I have reviewed the patient's chart and labs.  Questions were answered to the patient's satisfaction.     Lesly Rubenstein  Ok to proceed with EGD/Colonoscopy

## 2021-08-17 NOTE — Anesthesia Preprocedure Evaluation (Signed)
Anesthesia Evaluation  Patient identified by MRN, date of birth, ID band Patient awake    Reviewed: Allergy & Precautions, NPO status , Patient's Chart, lab work & pertinent test results  History of Anesthesia Complications Negative for: history of anesthetic complications  Airway Mallampati: III  TM Distance: >3 FB Neck ROM: full    Dental  (+) Missing, Poor Dentition   Pulmonary neg shortness of breath, asthma , former smoker,    Pulmonary exam normal        Cardiovascular Exercise Tolerance: Good hypertension, (-) angina(-) DOE Normal cardiovascular exam     Neuro/Psych PSYCHIATRIC DISORDERS negative neurological ROS     GI/Hepatic Neg liver ROS, GERD  Controlled,  Endo/Other  negative endocrine ROS  Renal/GU negative Renal ROS  negative genitourinary   Musculoskeletal   Abdominal   Peds  Hematology negative hematology ROS (+)   Anesthesia Other Findings Patient reports that she does that think that any food or pills are stuck in her throat at this time.  Past Medical History: No date: ADHD (attention deficit hyperactivity disorder) No date: Depression No date: Dysphagia No date: Gastric reflux No date: GERD (gastroesophageal reflux disease) No date: Hot flashes, menopausal No date: Hypertension  Past Surgical History: 10/10/2020: BACK SURGERY No date: NASAL SINUS SURGERY No date: SHOULDER SURGERY  BMI    Body Mass Index: 34.45 kg/m      Reproductive/Obstetrics negative OB ROS                             Anesthesia Physical Anesthesia Plan  ASA: 3  Anesthesia Plan: General   Post-op Pain Management:    Induction: Intravenous  PONV Risk Score and Plan: Propofol infusion and TIVA  Airway Management Planned: Natural Airway and Nasal Cannula  Additional Equipment:   Intra-op Plan:   Post-operative Plan:   Informed Consent: I have reviewed the patients  History and Physical, chart, labs and discussed the procedure including the risks, benefits and alternatives for the proposed anesthesia with the patient or authorized representative who has indicated his/her understanding and acceptance.     Dental Advisory Given  Plan Discussed with: Anesthesiologist, CRNA and Surgeon  Anesthesia Plan Comments: (Patient consented for risks of anesthesia including but not limited to:  - adverse reactions to medications - risk of airway placement if required - damage to eyes, teeth, lips or other oral mucosa - nerve damage due to positioning  - sore throat or hoarseness - Damage to heart, brain, nerves, lungs, other parts of body or loss of life  Patient voiced understanding.)        Anesthesia Quick Evaluation

## 2021-08-17 NOTE — Op Note (Signed)
[facility] Patient Name: Laurie Reyes Procedure Date: 08/17/2021 8:34 AM MRN: [id number] Account #: [id number] Date of Birth: 1961/03/22 Admit Type: Outpatient Age: 61 Room: [facility] ENDO ROOM 3 Gender: Female Note Status: Finalized Instrument Name: Upper Endoscope [id number] Procedure:             Upper GI endoscopy Indications:           Dysphagia, Gastro-esophageal reflux disease Providers:             Eather Colas MD, MD Referring MD:          No Local Md, MD (Referring MD) Medicines:             Monitored Anesthesia Care Complications:         No immediate complications. Estimated blood loss:                         Minimal. Procedure:             Pre-Anesthesia Assessment:                        - Prior to the procedure, a History and Physical was                         performed, and patient medications and allergies were                         reviewed. The patient is competent. The risks and                         benefits of the procedure and the sedation options and                         risks were discussed with the patient. All questions                         were answered and informed consent was obtained.                         Patient identification and proposed procedure were                         verified by the physician, the nurse, the                         anesthesiologist, the anesthetist and the technician                         in the endoscopy suite. Mental Status Examination:                         alert and oriented. Airway Examination: normal                         oropharyngeal airway and neck mobility. Respiratory                         Examination: clear to auscultation. CV Examination:  normal. Prophylactic Antibiotics: The patient does not                         require prophylactic antibiotics. Prior                         Anticoagulants: The patient has taken no  previous                         anticoagulant or antiplatelet agents. ASA Grade                         Assessment: II - A patient with mild systemic disease.                         After reviewing the risks and benefits, the patient                         was deemed in satisfactory condition to undergo the                         procedure. The anesthesia plan was to use monitored                         anesthesia care (MAC). Immediately prior to                         administration of medications, the patient was                         re-assessed for adequacy to receive sedatives. The                         heart rate, respiratory rate, oxygen saturations,                         blood pressure, adequacy of pulmonary ventilation, and                         response to care were monitored throughout the                         procedure. The physical status of the patient was                         re-assessed after the procedure.                        After obtaining informed consent, the endoscope was                         passed under direct vision. Throughout the procedure,                         the patient's blood pressure, pulse, and oxygen                         saturations were monitored continuously. The Endoscope  was introduced through the mouth, and advanced to the                         second part of duodenum. The upper GI endoscopy was                         accomplished without difficulty. The patient tolerated                         the procedure well. Findings:      No endoscopic abnormality was evident in the esophagus to explain the       patient's complaint of dysphagia. It was decided, however, to proceed       with dilation in the mid esophagus and in the distal esophagus. A TTS       dilator was passed through the scope. Dilation with a 15-16.5-18 mm       balloon dilator was performed to 18 mm. The dilation site was  examined       and showed no change. Biopsies were obtained from the proximal and       distal esophagus with cold forceps for histology of suspected       eosinophilic esophagitis. Estimated blood loss was minimal.      A small hiatal hernia was present.      The entire examined stomach was normal.      The examined duodenum was normal. Impression:            - No endoscopic esophageal abnormality to explain                         patient's dysphagia. Esophagus dilated. Dilated.                         Biopsied.                        - Small hiatal hernia.                        - Normal stomach.                        - Normal examined duodenum. Recommendation:        - Perform a colonoscopy today.                        - Await pathology results. Procedure Code(s):     --- Professional ---                        [phone], Esophagogastroduodenoscopy, flexible,                         transoral; with transendoscopic balloon dilation of                         esophagus (less than 30 mm diameter) Diagnosis Code(s):     --- Professional ---                        R13.10, Dysphagia, unspecified  K44.9, Diaphragmatic hernia without obstruction or                         gangrene                        K21.9, Gastro-esophageal reflux disease without                         esophagitis CPT copyright 2019 American Medical Association. All rights reserved. The codes documented in this report are preliminary and upon coder review may  be revised to meet current compliance requirements. Eather Colas MD, MD 08/17/2021 9:22:50 AM Number of Addenda: 0 Note Initiated On: 08/17/2021 8:34 AM Estimated Blood Loss:  Estimated blood loss was minimal.      [facility]

## 2021-08-17 NOTE — H&P (Signed)
Outpatient short stay form Pre-procedure 08/17/2021  Laurie Rubenstein, MD  Primary Physician: Carrolyn Meiers, MD  Reason for visit:  Dysphagia/Family history of colon cancer  History of present illness:    61 y/o lady with history of constipation, hypertension, and GERD here for EGD for solid food dysphagia and colonoscopy for family history of colon cancer in her Mother who was in her 65's. Last colonoscopy in 2018 was normal. No blood thinners. No abdominal surgeries.    Current Facility-Administered Medications:    0.9 %  sodium chloride infusion, , Intravenous, Continuous, Maha Fischel, Hilton Cork, MD, Last Rate: 20 mL/hr at 08/17/21 0839, Continued from Pre-op at 08/17/21 0839  Medications Prior to Admission  Medication Sig Dispense Refill Last Dose   amLODipine (NORVASC) 5 MG tablet Take 5 mg by mouth daily.   Past Week   DULoxetine (CYMBALTA) 60 MG capsule TAKE (1) CAPSULE BY MOUTH TWICE DAILY. 60 capsule 5 Past Week   EPINEPHrine (EPI-PEN) 0.3 mg/0.3 mL SOAJ injection    Past Week   gabapentin (NEURONTIN) 300 MG capsule   1 Past Week   hydrOXYzine (ATARAX/VISTARIL) 25 MG tablet TAKE 1 TABLET BY MOUTH ONCE DAILY AS NEEDED FOR ANXIETY 30 tablet 2 Past Week   LINZESS 72 MCG capsule    Past Week   lisdexamfetamine (VYVANSE) 70 MG capsule Take 1 capsule (70 mg total) by mouth daily. 30 capsule 0 Past Week   lisdexamfetamine (VYVANSE) 70 MG capsule Take 1 capsule (70 mg total) by mouth daily. 30 capsule 0 Past Week   lisdexamfetamine (VYVANSE) 70 MG capsule Take 1 capsule (70 mg total) by mouth daily. 30 capsule 0 Past Week   pantoprazole (PROTONIX) 40 MG tablet Take 40 mg by mouth 2 (two) times daily.   Past Week   traMADol (ULTRAM) 50 MG tablet tramadol 50 mg tablet  Take 2 tablets twice a day by oral route as needed.  Take 1-2 twice a day as needed   Past Week   traZODone (DESYREL) 100 MG tablet Take 2 tablets (200 mg total) by mouth at bedtime. 60 tablet 2 Past Week    amLODipine-atorvastatin (CADUET) 5-10 MG tablet Take 1 tablet by mouth daily. (Patient not taking: Reported on 10/20/2020)      baclofen (LIORESAL) 10 MG tablet baclofen 10 mg tablet (Patient not taking: Reported on 10/20/2020)      cyclobenzaprine (FLEXERIL) 10 MG tablet cyclobenzaprine 10 mg tablet  Take 1 tablet twice a day by oral route as needed. (Patient not taking: Reported on 10/20/2020)      ergocalciferol (VITAMIN D2) 1.25 MG (50000 UT) capsule Vitamin D2 1,250 mcg (50,000 unit) capsule  Take 1 capsule every month by oral route. (Patient not taking: Reported on 10/20/2020)      meclizine (ANTIVERT) 25 MG tablet meclizine 25 mg tablet (Patient not taking: Reported on 10/20/2020)      nitrofurantoin, macrocrystal-monohydrate, (MACROBID) 100 MG capsule nitrofurantoin monohydrate/macrocrystals 100 mg capsule (Patient not taking: Reported on 10/20/2020)      ondansetron (ZOFRAN) 4 MG tablet ondansetron HCl 4 mg tablet (Patient not taking: Reported on 10/20/2020)      Plecanatide 3 MG TABS Take by mouth. (Patient not taking: Reported on 10/20/2020)        Allergies  Allergen Reactions   Penicillins Shortness Of Breath and Rash   Shellfish Allergy Anaphylaxis and Shortness Of Breath   Sulfa Antibiotics Shortness Of Breath and Rash     Past Medical History:  Diagnosis Date  ADHD (attention deficit hyperactivity disorder)    Depression    Dysphagia    Gastric reflux    GERD (gastroesophageal reflux disease)    Hot flashes, menopausal    Hypertension     Review of systems:  Otherwise negative.    Physical Exam  Gen: Alert, oriented. Appears stated age.  HEENT: PERRLA. Lungs: No respiratory distress CV: RRR Abd: soft, benign, no masses Ext: No edema    Planned procedures: Proceed with colonoscopy. The patient understands the nature of the planned procedure, indications, risks, alternatives and potential complications including but not limited to bleeding, infection, perforation, damage  to internal organs and possible oversedation/side effects from anesthesia. The patient agrees and gives consent to proceed.  Please refer to procedure notes for findings, recommendations and patient disposition/instructions.     Laurie Rubenstein, MD [facility]

## 2021-08-17 NOTE — Op Note (Signed)
[facility] Gastroenterology Patient Name: Laurie Reyes Procedure Date: 08/17/2021 8:34 AM MRN: [id number] Account #: [id number] Date of Birth: 1961-01-09 Admit Type: Outpatient Age: 61 Room: [facility] ENDO ROOM 3 Gender: Female Note Status: Finalized Instrument Name: Prentice Docker [id number] Procedure:             Colonoscopy Indications:           Screening patient at increased risk: Family history of                         1st-degree relative with colorectal cancer at age 4                         years (or older) Providers:             Eather Colas MD, MD Referring MD:          No Local Md, MD (Referring MD) Medicines:             Monitored Anesthesia Care Complications:         No immediate complications. Estimated blood loss:                         Minimal. Procedure:             Pre-Anesthesia Assessment:                        - Prior to the procedure, a History and Physical was                         performed, and patient medications and allergies were                         reviewed. The patient is competent. The risks and                         benefits of the procedure and the sedation options and                         risks were discussed with the patient. All questions                         were answered and informed consent was obtained.                         Patient identification and proposed procedure were                         verified by the physician, the nurse, the                         anesthesiologist, the anesthetist and the technician                         in the endoscopy suite. Mental Status Examination:                         alert and oriented. Airway Examination: normal  oropharyngeal airway and neck mobility. Respiratory                         Examination: clear to auscultation. CV Examination:                         normal. Prophylactic Antibiotics: The patient does not                          require prophylactic antibiotics. Prior                         Anticoagulants: The patient has taken no previous                         anticoagulant or antiplatelet agents. ASA Grade                         Assessment: II - A patient with mild systemic disease.                         After reviewing the risks and benefits, the patient                         was deemed in satisfactory condition to undergo the                         procedure. The anesthesia plan was to use monitored                         anesthesia care (MAC). Immediately prior to                         administration of medications, the patient was                         re-assessed for adequacy to receive sedatives. The                         heart rate, respiratory rate, oxygen saturations,                         blood pressure, adequacy of pulmonary ventilation, and                         response to care were monitored throughout the                         procedure. The physical status of the patient was                         re-assessed after the procedure.                        After obtaining informed consent, the colonoscope was                         passed under direct vision. Throughout the procedure,  the patient's blood pressure, pulse, and oxygen                         saturations were monitored continuously. The                         Colonoscope was introduced through the anus and                         advanced to the the cecum, identified by appendiceal                         orifice and ileocecal valve. The colonoscopy was                         somewhat difficult due to significant looping.                         Successful completion of the procedure was aided by                         applying abdominal pressure. The patient tolerated the                         procedure well. The quality of the bowel preparation                         was  good. Findings:      The perianal and digital rectal examinations were normal.      A 2 mm polyp was found in the ascending colon. The polyp was sessile.       The polyp was removed with a cold snare. Resection and retrieval were       complete. Estimated blood loss was minimal.      The exam was otherwise without abnormality on direct and retroflexion       views. Impression:            - One 2 mm polyp in the ascending colon, removed with                         a cold snare. Resected and retrieved.                        - The examination was otherwise normal on direct and                         retroflexion views. Recommendation:        - Discharge patient to home.                        - Resume previous diet.                        - Continue present medications.                        - Await pathology results.                        - Repeat colonoscopy in 7  years for surveillance.                        - Return to referring physician as previously                         scheduled. Procedure Code(s):     --- Professional ---                        [id number], Colonoscopy, flexible; with removal of                         tumor(s), polyp(s), or other lesion(s) by snare                         technique Diagnosis Code(s):     --- Professional ---                        Z80.0, Family history of malignant neoplasm of                         digestive organs                        K63.5, Polyp of colon CPT copyright 2019 American [employer] Association. All rights reserved. The codes documented in this report are preliminary and upon coder review may  be revised to meet current compliance requirements. Eather Colas MD, MD 08/17/2021 9:25:12 AM Number of Addenda: 0 Note Initiated On: 08/17/2021 8:34 AM Scope Withdrawal Time: 0 hours 10 minutes 48 seconds  Total Procedure Duration: 0 hours 18 minutes 6 seconds  Estimated Blood Loss:  Estimated blood loss was minimal.      [facility]

## 2021-08-17 NOTE — Transfer of Care (Signed)
Immediate Anesthesia Transfer of Care Note  Patient: Laurie Reyes  Procedure(s) Performed: COLONOSCOPY WITH PROPOFOL ESOPHAGOGASTRODUODENOSCOPY (EGD) WITH PROPOFOL  Patient Location: PACU  Anesthesia Type:General  Level of Consciousness: awake and drowsy  Airway & Oxygen Therapy: Patient Spontanous Breathing  Post-op Assessment: Report given to RN and Post -op Vital signs reviewed and stable  Post vital signs: Reviewed and stable  Last Vitals:  Vitals Value Taken Time  BP 129/78 08/17/21 0922  Temp 36.8 C 08/17/21 0920  Pulse 80 08/17/21 0923  Resp 17 08/17/21 0923  SpO2 100 % 08/17/21 0923  Vitals shown include unvalidated device data.  Last Pain:  Vitals:   08/17/21 0920  TempSrc: Temporal  PainSc:          Complications: No notable events documented.

## 2021-08-18 ENCOUNTER — Encounter: Payer: Self-pay | Admitting: Gastroenterology

## 2021-08-18 LAB — SURGICAL PATHOLOGY

## 2021-09-23 DIAGNOSIS — Z6836 Body mass index (BMI) 36.0-36.9, adult: Secondary | ICD-10-CM | POA: Diagnosis not present

## 2021-09-23 DIAGNOSIS — I10 Essential (primary) hypertension: Secondary | ICD-10-CM | POA: Diagnosis not present

## 2021-09-23 DIAGNOSIS — R0602 Shortness of breath: Secondary | ICD-10-CM | POA: Diagnosis not present

## 2021-09-23 DIAGNOSIS — E7800 Pure hypercholesterolemia, unspecified: Secondary | ICD-10-CM | POA: Diagnosis not present

## 2021-09-30 DIAGNOSIS — F330 Major depressive disorder, recurrent, mild: Secondary | ICD-10-CM | POA: Diagnosis not present

## 2021-09-30 DIAGNOSIS — Z0001 Encounter for general adult medical examination with abnormal findings: Secondary | ICD-10-CM | POA: Diagnosis not present

## 2021-09-30 DIAGNOSIS — Z1159 Encounter for screening for other viral diseases: Secondary | ICD-10-CM | POA: Diagnosis not present

## 2021-09-30 DIAGNOSIS — Z6836 Body mass index (BMI) 36.0-36.9, adult: Secondary | ICD-10-CM | POA: Diagnosis not present

## 2021-09-30 DIAGNOSIS — I10 Essential (primary) hypertension: Secondary | ICD-10-CM | POA: Diagnosis not present

## 2021-09-30 DIAGNOSIS — K219 Gastro-esophageal reflux disease without esophagitis: Secondary | ICD-10-CM | POA: Diagnosis not present

## 2021-09-30 DIAGNOSIS — R7303 Prediabetes: Secondary | ICD-10-CM | POA: Diagnosis not present

## 2021-12-07 DIAGNOSIS — Z01419 Encounter for gynecological examination (general) (routine) without abnormal findings: Secondary | ICD-10-CM | POA: Diagnosis not present

## 2021-12-18 DIAGNOSIS — Z86018 Personal history of other benign neoplasm: Secondary | ICD-10-CM | POA: Diagnosis not present

## 2022-01-29 ENCOUNTER — Encounter: Payer: Self-pay | Admitting: Emergency Medicine

## 2022-01-29 ENCOUNTER — Other Ambulatory Visit: Payer: Self-pay

## 2022-01-29 ENCOUNTER — Emergency Department (HOSPITAL_COMMUNITY)
Admission: EM | Admit: 2022-01-29 | Discharge: 2022-01-29 | Disposition: A | Discharge status: Home / Self Care | Payer: BC Managed Care – PPO | Attending: Emergency Medicine | Admitting: Emergency Medicine

## 2022-01-29 ENCOUNTER — Ambulatory Visit (INDEPENDENT_AMBULATORY_CARE_PROVIDER_SITE_OTHER): Payer: BC Managed Care – PPO

## 2022-01-29 ENCOUNTER — Ambulatory Visit
Admission: EM | Admit: 2022-01-29 | Discharge: 2022-01-29 | Disposition: A | Discharge status: Home / Self Care | Payer: BC Managed Care – PPO | Attending: Urgent Care | Admitting: Urgent Care

## 2022-01-29 DIAGNOSIS — Z79899 Other long term (current) drug therapy: Secondary | ICD-10-CM | POA: Diagnosis not present

## 2022-01-29 DIAGNOSIS — I10 Essential (primary) hypertension: Secondary | ICD-10-CM | POA: Insufficient documentation

## 2022-01-29 DIAGNOSIS — R0602 Shortness of breath: Secondary | ICD-10-CM | POA: Diagnosis not present

## 2022-01-29 DIAGNOSIS — R600 Localized edema: Secondary | ICD-10-CM | POA: Insufficient documentation

## 2022-01-29 DIAGNOSIS — M7989 Other specified soft tissue disorders: Secondary | ICD-10-CM | POA: Diagnosis not present

## 2022-01-29 DIAGNOSIS — R2243 Localized swelling, mass and lump, lower limb, bilateral: Secondary | ICD-10-CM | POA: Diagnosis not present

## 2022-01-29 DIAGNOSIS — R609 Edema, unspecified: Secondary | ICD-10-CM

## 2022-01-29 LAB — CBC WITH DIFFERENTIAL/PLATELET
Abs Immature Granulocytes: 0.02 10*3/uL (ref 0.00–0.07)
Basophils Absolute: 0 10*3/uL (ref 0.0–0.1)
Basophils Relative: 1 %
Eosinophils Absolute: 0.2 10*3/uL (ref 0.0–0.5)
Eosinophils Relative: 3 %
HCT: 39.2 % (ref 36.0–46.0)
Hemoglobin: 12.1 g/dL (ref 12.0–15.0)
Immature Granulocytes: 0 %
Lymphocytes Relative: 41 %
Lymphs Abs: 2.5 10*3/uL (ref 0.7–4.0)
MCH: 25.3 pg — ABNORMAL LOW (ref 26.0–34.0)
MCHC: 30.9 g/dL (ref 30.0–36.0)
MCV: 82 fL (ref 80.0–100.0)
Monocytes Absolute: 0.4 10*3/uL (ref 0.1–1.0)
Monocytes Relative: 7 %
Neutro Abs: 2.9 10*3/uL (ref 1.7–7.7)
Neutrophils Relative %: 48 %
Platelets: 295 10*3/uL (ref 150–400)
RBC: 4.78 MIL/uL (ref 3.87–5.11)
RDW: 16.2 % — ABNORMAL HIGH (ref 11.5–15.5)
WBC: 6 10*3/uL (ref 4.0–10.5)
nRBC: 0 % (ref 0.0–0.2)

## 2022-01-29 LAB — BASIC METABOLIC PANEL
Anion gap: 8 (ref 5–15)
BUN: 11 mg/dL (ref 8–23)
CO2: 27 mmol/L (ref 22–32)
Calcium: 9.3 mg/dL (ref 8.9–10.3)
Chloride: 102 mmol/L (ref 98–111)
Creatinine, Ser: 0.93 mg/dL (ref 0.44–1.00)
GFR, Estimated: >60 mL/min (ref >60)
Glucose, Bld: 91 mg/dL (ref 70–99)
Potassium: 3.7 mmol/L (ref 3.5–5.1)
Sodium: 137 mmol/L (ref 135–145)

## 2022-01-29 LAB — HEPATIC FUNCTION PANEL
ALT: 18 U/L (ref 0–44)
AST: 18 U/L (ref 15–41)
Albumin: 4.2 g/dL (ref 3.5–5.0)
Alkaline Phosphatase: 79 U/L (ref 38–126)
Bilirubin, Direct: 0.1 mg/dL (ref 0.0–0.2)
Indirect Bilirubin: 0.5 mg/dL (ref 0.3–0.9)
Total Bilirubin: 0.6 mg/dL (ref 0.3–1.2)
Total Protein: 8.2 g/dL — ABNORMAL HIGH (ref 6.5–8.1)

## 2022-01-29 LAB — BRAIN NATRIURETIC PEPTIDE: B Natriuretic Peptide: 7 pg/mL (ref 0.0–100.0)

## 2022-01-29 NOTE — Discharge Instructions (Signed)
Please report to the emergency room as you are in need of a higher level of evaluation and care than we can provide in the urgent care setting. Given your shortness of breath and lower leg swelling I believe you would benefit from having testing to rule out a blood clot and/or heart failure. Your chest x-ray here was negative but more testing can be done through the emergency room. Please head there now.

## 2022-01-29 NOTE — ED Provider Notes (Signed)
[facility] - URGENT CARE CENTER   MRN: [id number] DOB: 12-Jun-1961  Subjective:   Laurie Reyes is a 61 y.o. female presenting for 3 day history of acute onset bilateral painful lower leg swelling, shortness of breath. No fever, cough, hemoptysis, chest pain, n/v, abdominal pain, bruising, redness, warmth. No history of congestive heart failure, dvt, pulmonary embolism. No recent hospitalizations or long distance travel. No cancer history. No smoking. No surgeries in the past month. No history of varicose veins, peripheral vascular disease.    No current facility-administered medications for this encounter.  Current Outpatient Medications:    amLODipine (NORVASC) 5 MG tablet, Take 5 mg by mouth daily., Disp: , Rfl:    DULoxetine (CYMBALTA) 60 MG capsule, TAKE (1) CAPSULE BY MOUTH TWICE DAILY., Disp: 60 capsule, Rfl: 5   EPINEPHrine (EPI-PEN) 0.3 mg/0.3 mL SOAJ injection, , Disp: , Rfl:    pantoprazole (PROTONIX) 40 MG tablet, Take 40 mg by mouth 2 (two) times daily., Disp: , Rfl:    traMADol (ULTRAM) 50 MG tablet, tramadol 50 mg tablet  Take 2 tablets twice a day by oral route as needed.  Take 1-2 twice a day as needed, Disp: , Rfl:    traZODone (DESYREL) 100 MG tablet, Take 2 tablets (200 mg total) by mouth at bedtime., Disp: 60 tablet, Rfl: 2   ergocalciferol (VITAMIN D2) 1.25 MG (50000 UT) capsule, Vitamin D2 1,250 mcg (50,000 unit) capsule  Take 1 capsule every month by oral route. (Patient not taking: Reported on 10/20/2020), Disp: , Rfl:    Allergies  Allergen Reactions   Penicillins Shortness Of Breath and Rash   Shellfish Allergy Anaphylaxis and Shortness Of Breath   Sulfa Antibiotics Shortness Of Breath and Rash    Past Medical History:  Diagnosis Date   ADHD (attention deficit hyperactivity disorder)    Depression    Dysphagia    Gastric reflux    GERD (gastroesophageal reflux disease)    Hot flashes, menopausal    Hypertension      Past Surgical History:   Procedure Laterality Date   BACK SURGERY  10/10/2020   COLONOSCOPY WITH PROPOFOL N/A 08/17/2021   Procedure: COLONOSCOPY WITH PROPOFOL;  Surgeon: Regis Bill, MD;  Location: [facility] ENDOSCOPY;  Service: Endoscopy;  Laterality: N/A;   ESOPHAGOGASTRODUODENOSCOPY (EGD) WITH PROPOFOL N/A 08/17/2021   Procedure: ESOPHAGOGASTRODUODENOSCOPY (EGD) WITH PROPOFOL;  Surgeon: Regis Bill, MD;  Location: [facility] ENDOSCOPY;  Service: Endoscopy;  Laterality: N/A;   NASAL SINUS SURGERY     SHOULDER SURGERY      Family History  Problem Relation Age of Onset   Alcohol abuse Father    ADD / ADHD Other    ADD / ADHD Other     Social History   Tobacco Use   Smoking status: Former   Smokeless tobacco: Never  Substance Use Topics   Alcohol use: No   Drug use: No    ROS   Objective:   Vitals: BP 131/74 (BP Location: Right Arm)   Pulse 80   Temp 99 F (37.2 C) (Oral)   Resp 18   Ht 5\' 5"  (1.651 m)   Wt 215 lb (97.5 kg)   SpO2 96%   BMI 35.78 kg/m   Physical Exam Constitutional:      General: She is not in acute distress.    Appearance: Normal appearance. She is well-developed. She is not ill-appearing, toxic-appearing or diaphoretic.  HENT:     Head: Normocephalic and atraumatic.  Nose: Nose normal.     Mouth/Throat:     Mouth: Mucous membranes are moist.  Eyes:     General: No scleral icterus.       Right eye: No discharge.        Left eye: No discharge.     Extraocular Movements: Extraocular movements intact.  Cardiovascular:     Rate and Rhythm: Normal rate and regular rhythm.     Heart sounds: Normal heart sounds. No murmur heard.    No friction rub. No gallop.  Pulmonary:     Effort: Pulmonary effort is normal. No respiratory distress.     Breath sounds: No stridor. No wheezing, rhonchi or rales.  Chest:     Chest wall: No tenderness.  Musculoskeletal:     Right lower leg: Swelling and tenderness present. No deformity, lacerations or bony tenderness. No  edema.     Left lower leg: Swelling and tenderness present. No deformity, lacerations or bony tenderness. No edema.       Legs:     Comments: There is tenderness throughout lower legs bilaterally. No warmth, erythema, wounds.   Skin:    General: Skin is warm and dry.  Neurological:     General: No focal deficit present.     Mental Status: She is alert and oriented to person, place, and time.  Psychiatric:        Mood and Affect: Mood normal.        Behavior: Behavior normal.    DG Chest 2 View  Result Date: 01/29/2022 CLINICAL DATA:  Shortness of breath. EXAM: CHEST - 2 VIEW COMPARISON:  Chest x-ray 11/20/2013 FINDINGS: The heart size and mediastinal contours are within normal limits. Both lungs are clear. The visualized skeletal structures are unremarkable. IMPRESSION: No active cardiopulmonary disease. Electronically Signed   By: Darliss Cheney M.D.   On: 01/29/2022 16:12    Assessment and Plan :   PDMP not reviewed this encounter.  1. Shortness of breath   2. Localized swelling of both lower legs    Patient is in need of a higher level of testing and care than we can provide in the urgent care setting. I believe patient would benefit from getting a d-dimer and/or bnp test, consideration for chest CT or ultrasound. Concern is for pulmonary embolism, chf. Chest x-ray was negative but symptoms of shortness of breath, lower leg swelling bilaterally warrant further work up. Deferred COVID testing. Patient is hemodynamically stable and can present to the emergency room by personal vehicle.   Wallis Bamberg, [place] 01/29/22 1628

## 2022-01-29 NOTE — ED Triage Notes (Signed)
Patient c/o bilateral leg pain and swelling x 2-3 days.  No apparent injury.  Denies any OTC pain meds.

## 2022-01-29 NOTE — ED Provider Notes (Signed)
[facility] Provider Note   CSN: [id number] Arrival date & time: 01/29/22  1635     History  Chief Complaint  Patient presents with   Leg Swelling    Laurie Reyes is a 61 y.o. female.  Is a 61 year old female with a past medical history of hypertension and GERD presenting to the emergency department with lower extremity swelling.  The patient states over the last week she has had increasing swelling in her bilateral legs.  She states that she does sit for long periods of time at work and feels like her swelling is worse at the end of the day but does not completely resolve after laying down at night.  She states that she has mild associated shortness of breath.  She denies any recent hospitalizations, recent long travels in the car or plane, hormone use, cancer history, prior blood clots.  She denies any known kidney or liver disease.  She states that she went to urgent care this morning and had initial evaluation with a chest x-ray but was recommended to come to the emergency department for further evaluation of blood work.  The history is provided by the patient.       Home Medications Prior to Admission medications   Medication Sig Start Date End Date Taking? Authorizing Provider  amLODipine (NORVASC) 5 MG tablet Take 5 mg by mouth daily. 01/31/20   [provider]  DULoxetine (CYMBALTA) 60 MG capsule TAKE (1) CAPSULE BY MOUTH TWICE DAILY. 12/08/20   Cloria Spring, MD  EPINEPHrine (EPI-PEN) 0.3 mg/0.3 mL SOAJ injection  03/06/13   [provider]  ergocalciferol (VITAMIN D2) 1.25 MG (50000 UT) capsule Vitamin D2 1,250 mcg (50,000 unit) capsule  Take 1 capsule every month by oral route. Patient not taking: Reported on 10/20/2020 11/26/17   [provider]  pantoprazole (PROTONIX) 40 MG tablet Take 40 mg by mouth 2 (two) times daily. 03/16/13   [provider]  traMADol (ULTRAM) 50 MG tablet tramadol 50 mg tablet  Take 2  tablets twice a day by oral route as needed.  Take 1-2 twice a day as needed    [provider]  traZODone (DESYREL) 100 MG tablet Take 2 tablets (200 mg total) by mouth at bedtime. 12/08/20   Cloria Spring, MD      Allergies    Penicillins, Shellfish allergy, and Sulfa antibiotics    Review of Systems   Review of Systems  Physical Exam Updated Vital Signs BP (!) 147/98 (BP Location: Right Arm)   Pulse 81   Temp 98 F (36.7 C) (Oral)   Resp 18   Ht '5\' 5"'$  (1.651 m)   Wt 97.5 kg   SpO2 100%   BMI 35.78 kg/m  Physical Exam Vitals and nursing note reviewed.  Constitutional:      General: She is not in acute distress.    Appearance: Normal appearance. She is obese.  HENT:     Head: Normocephalic and atraumatic.     Nose: Nose normal.     Mouth/Throat:     Mouth: Mucous membranes are moist.  Eyes:     Extraocular Movements: Extraocular movements intact.     Conjunctiva/sclera: Conjunctivae normal.     Pupils: Pupils are equal, round, and reactive to light.  Cardiovascular:     Rate and Rhythm: Normal rate and regular rhythm.     Pulses: Normal pulses.     Heart sounds: Normal heart sounds.  Pulmonary:  Effort: Pulmonary effort is normal.     Breath sounds: Normal breath sounds.  Abdominal:     General: Abdomen is flat.     Palpations: Abdomen is soft.     Tenderness: There is no abdominal tenderness.  Musculoskeletal:        General: Normal range of motion.     Cervical back: Normal range of motion and neck supple.     Right lower leg: Edema (Trace, nonpitting) present.     Left lower leg: Edema (Trace, nonpitting) present.     Comments: No overlying erythema or warmth of bilateral lower extremities, no calf tenderness  Skin:    General: Skin is warm and dry.  Neurological:     General: No focal deficit present.     Mental Status: She is alert and oriented to person, place, and time.  Psychiatric:        Mood and Affect: Mood normal.         Behavior: Behavior normal.     ED Results / Procedures / Treatments   Labs (all labs ordered are listed, but only abnormal results are displayed) Labs Reviewed  CBC WITH DIFFERENTIAL/PLATELET - Abnormal; Notable for the following components:      Result Value   MCH 25.3 (*)    RDW 16.2 (*)    All other components within normal limits  HEPATIC FUNCTION PANEL - Abnormal; Notable for the following components:   Total Protein 8.2 (*)    All other components within normal limits  BASIC METABOLIC PANEL  BRAIN NATRIURETIC PEPTIDE  CBC WITH DIFFERENTIAL/PLATELET    EKG None  Radiology DG Chest 2 View  Result Date: 01/29/2022 CLINICAL DATA:  Shortness of breath. EXAM: CHEST - 2 VIEW COMPARISON:  Chest x-ray 11/20/2013 FINDINGS: The heart size and mediastinal contours are within normal limits. Both lungs are clear. The visualized skeletal structures are unremarkable. IMPRESSION: No active cardiopulmonary disease. Electronically Signed   By: Ronney Asters M.D.   On: 01/29/2022 16:12    Procedures Procedures    Medications Ordered in ED Medications - No data to display  ED Course/ Medical Decision Making/ A&P Clinical Course as of 01/29/22 2304  Fri Jan 29, 2022  2304 Labs reviewed and interpreted by myself within normal range.  The patient likely has dependent edema and was recommended compression stockings.  She was recommended primary care follow-up.  The patient is amenable with the plan.  Her questions were answered and she was discharged in stable condition. [VK]    Clinical Course User Index [VK] Ottie Glazier, DO                           Medical Decision Making Is a 61 year old female with a past medical history of hypertension and GERD presenting to the emergency department with lower extremity swelling.  Patient's chest x-ray from urgent care was reviewed by myself and showed no signs of cardiomegaly or pulmonary edema.  Patient is low risk by Wells criteria for  DVT, making DVT unlikely.  She will have labs including BNP, BMP, and LFTs to evaluate for kidney dysfunction, liver dysfunction or CHF as causes of her volume overload.  She also could have dependent edema from spending long periods of time sitting during the day.  Amount and/or Complexity of Data Reviewed Labs: ordered. Decision-making details documented in ED Course. Radiology:  Decision-making details documented in ED Course. ECG/medicine tests:  Decision-making details documented in  ED Course.           Final Clinical Impression(s) / ED Diagnoses Final diagnoses:  Peripheral edema    Rx / DC Orders ED Discharge Orders     None         Ottie Glazier, DO 01/29/22 2305

## 2022-01-29 NOTE — ED Notes (Signed)
Patient is being discharged from the Urgent Care and sent to the Emergency Department via POV . Per MM, patient is in need of higher level of care due to SOB/leg swelling. Patient is aware and verbalizes understanding of plan of care.  Vitals:   01/29/22 1555  BP: 131/74  Pulse: 80  Resp: 18  Temp: 99 F (37.2 C)  SpO2: 96%

## 2022-01-29 NOTE — ED Triage Notes (Signed)
Pt presents with bilateral leg swellings since Tuesday.

## 2022-01-29 NOTE — Discharge Instructions (Signed)
You were seen in the emergency department for your lower leg swelling.  You had no signs of blood clots, no signs of heart failure, kidney disease or liver disease causing your swelling.  You may have dependent edema from sitting for long periods of time and I recommend that you wear compression stockings to help with your swelling.  You should follow-up with your primary doctor in the next few days to have your symptoms rechecked to determine if you need further work-up as an outpatient.  You should return to the emergency department if 1 leg is significantly more swollen compared to the other, you develop redness or warmth of your leg you have significantly worsening shortness of breath or if you have any other new or concerning symptoms.

## 2022-02-02 ENCOUNTER — Telehealth: Payer: Self-pay | Admitting: *Deleted

## 2022-02-02 NOTE — Chronic Care Management (AMB) (Signed)
Care Coordination  Outreach Note  02/02/2022 Name: Danyl Witbeck MRN: [id number] DOB: January 25, 1961   Care Coordination Outreach Attempts  An unsuccessful telephone outreach was attempted today to offer the patient information about available care coordination services as a benefit of their health plan.   Follow Up Plan:  Additional outreach attempts will be made to offer the patient care coordination information and services.   Encounter Outcome:  No Answer  Gwenevere Ghazi  Care Coordination Care Guide  Direct Dial: [phone]

## 2022-02-03 ENCOUNTER — Other Ambulatory Visit (HOSPITAL_COMMUNITY): Payer: Self-pay | Admitting: Internal Medicine

## 2022-02-03 DIAGNOSIS — Z1231 Encounter for screening mammogram for malignant neoplasm of breast: Secondary | ICD-10-CM

## 2022-02-11 NOTE — Chronic Care Management (AMB) (Signed)
Care Coordination  Outreach Note  02/11/2022 Name: Jyotsna Norvell MRN: [id number] DOB: Jan 17, 1961   Care Coordination Outreach Attempts  A second unsuccessful outreach was attempted today to offer the patient with information about available care coordination services as a benefit of their health plan.     Follow Up Plan:  Additional outreach attempts will be made to offer the patient care coordination information and services.   Encounter Outcome:  No Answer  Gwenevere Ghazi  Care Coordination Care Guide  Direct Dial: [phone]

## 2022-02-12 ENCOUNTER — Ambulatory Visit (HOSPITAL_COMMUNITY)
Admission: RE | Admit: 2022-02-12 | Discharge: 2022-02-12 | Disposition: A | Discharge status: Home / Self Care | Payer: BC Managed Care – PPO | Source: Ambulatory Visit | Attending: Internal Medicine | Admitting: Internal Medicine

## 2022-02-12 DIAGNOSIS — E785 Hyperlipidemia, unspecified: Secondary | ICD-10-CM | POA: Diagnosis not present

## 2022-02-12 DIAGNOSIS — M25562 Pain in left knee: Secondary | ICD-10-CM | POA: Diagnosis not present

## 2022-02-12 DIAGNOSIS — F330 Major depressive disorder, recurrent, mild: Secondary | ICD-10-CM | POA: Diagnosis not present

## 2022-02-12 DIAGNOSIS — Z1231 Encounter for screening mammogram for malignant neoplasm of breast: Secondary | ICD-10-CM | POA: Insufficient documentation

## 2022-02-12 DIAGNOSIS — R7303 Prediabetes: Secondary | ICD-10-CM | POA: Diagnosis not present

## 2022-02-12 DIAGNOSIS — I10 Essential (primary) hypertension: Secondary | ICD-10-CM | POA: Diagnosis not present

## 2022-02-16 NOTE — Chronic Care Management (AMB) (Signed)
Care Coordination  Outreach Note  02/16/2022 Name: Laurie Reyes MRN: [id number] DOB: [birth year]   Care Coordination Outreach Attempts  A third unsuccessful outreach was attempted today to offer the patient with information about available care coordination services as a benefit of their health plan.   Follow Up Plan:  No further outreach attempts will be made at this time. We have been unable to contact the patient to offer or enroll patient in care coordination services  Encounter Outcome:  No Answer  Gwenevere Ghazi  Care Coordination Care Guide  Direct Dial: [phone]

## 2022-03-29 DIAGNOSIS — Z23 Encounter for immunization: Secondary | ICD-10-CM | POA: Diagnosis not present

## 2022-03-29 DIAGNOSIS — E7800 Pure hypercholesterolemia, unspecified: Secondary | ICD-10-CM | POA: Diagnosis not present

## 2022-03-29 DIAGNOSIS — R0602 Shortness of breath: Secondary | ICD-10-CM | POA: Diagnosis not present

## 2022-03-29 DIAGNOSIS — I10 Essential (primary) hypertension: Secondary | ICD-10-CM | POA: Diagnosis not present

## 2022-03-29 IMAGING — MR MR LUMBAR SPINE W/O CM
4 of 5 series · 19 of 48 positions shown · non-contrast
Comparison: Lumbar radiographs 10/09/2019

CLINICAL DATA: Low back pain with bilateral leg pain and foot
numbness

EXAM:
MRI LUMBAR SPINE WITHOUT CONTRAST
TECHNIQUE: Multiplanar, multisequence MR imaging of the lumbar spine was
performed. No intravenous contrast was administered.

[Series 2: T2 · sagittal · 4.0mm · 0.55mm/px · 6 of 15 slices shown (1 of 2)]
[im 1/15]
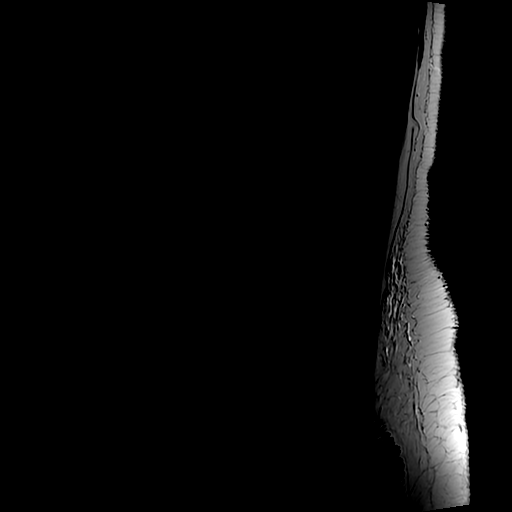
[im 3/15]
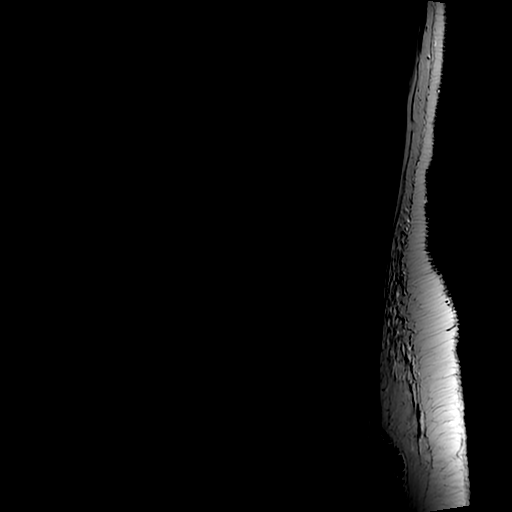
[im 6/15]
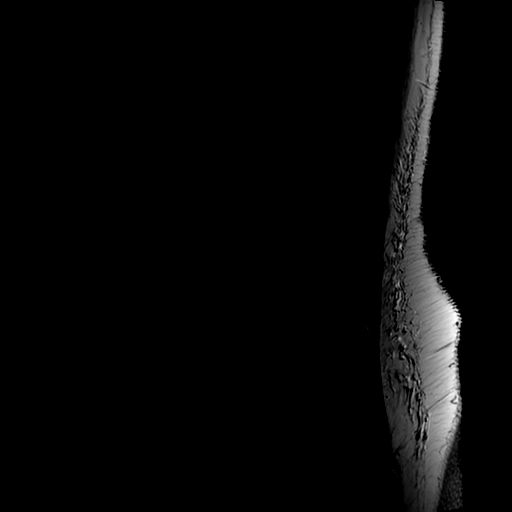
[im 9/15]
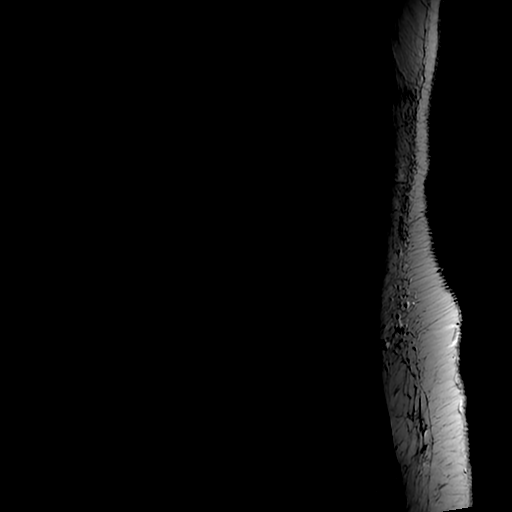
[im 12/15]
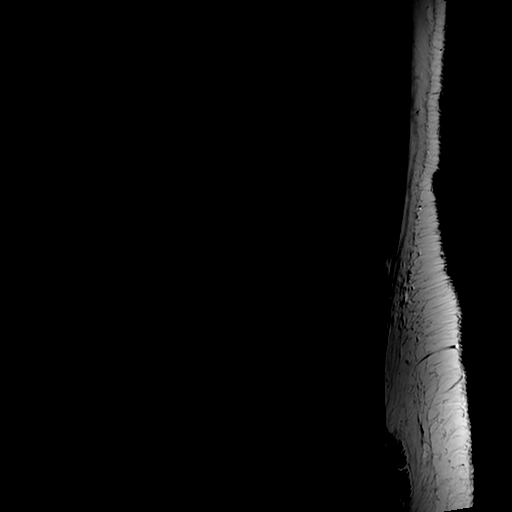
[im 15/15]
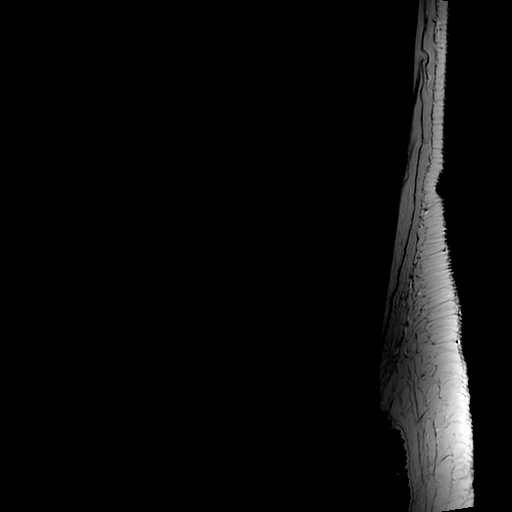

[Series 4: T1 · sagittal · 4.0mm · 0.55mm/px · 3 of 15 slices shown (1 of 2)]
[im 3/15]
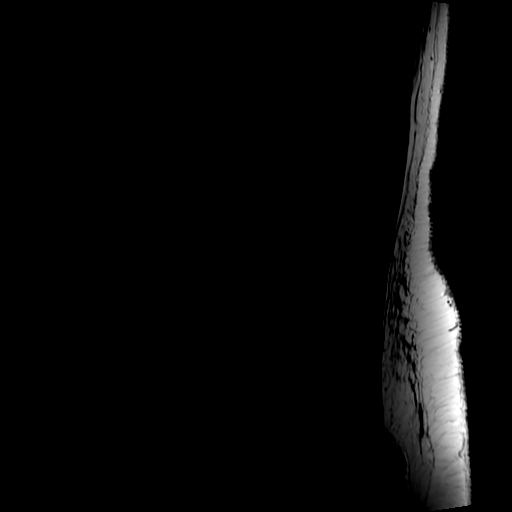
[im 8/15]
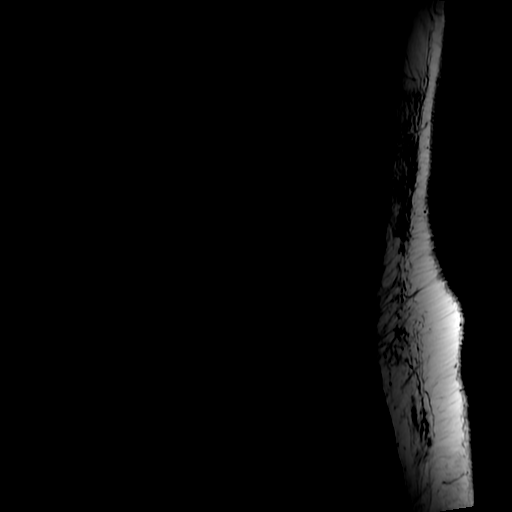
[im 12/15]
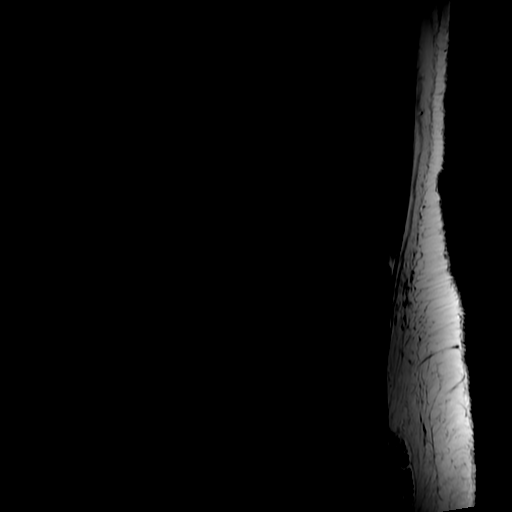

[Series 5: T2 · axial · 4.0mm · 0.39mm/px · z∈[-63,+86]mm · 7 of 32 slices shown (2 of 2)]
[im 1/32]
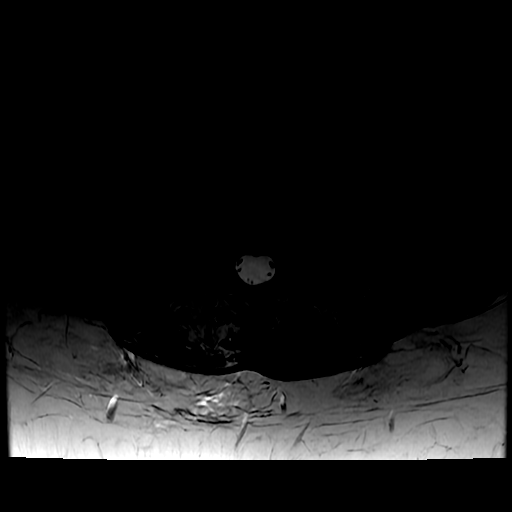
[im 5/32]
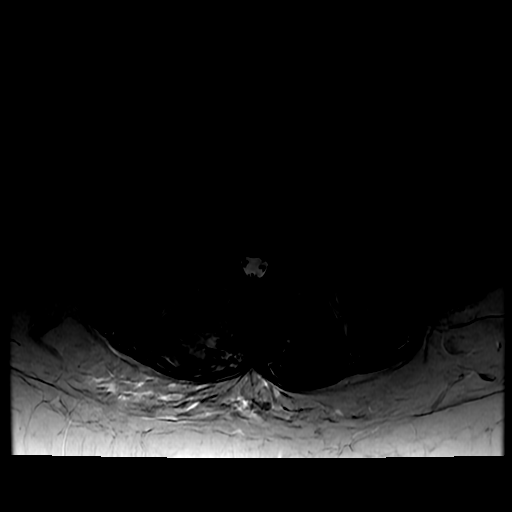
[im 10/32]
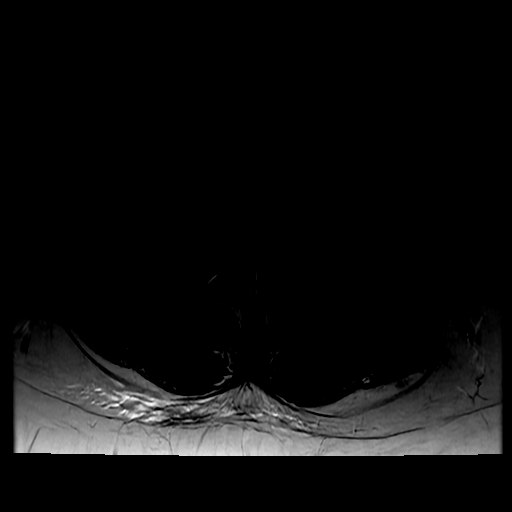
[im 15/32]
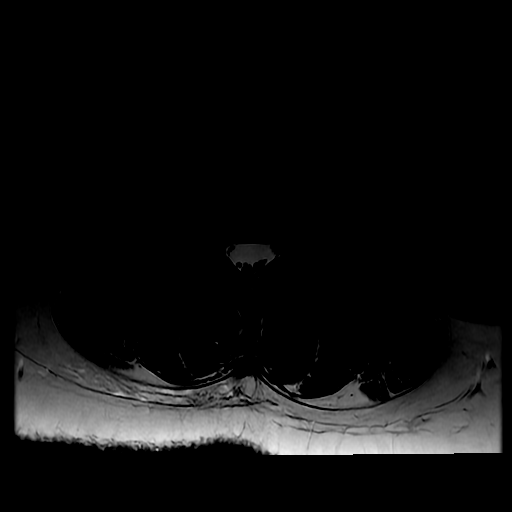
[im 17/32]
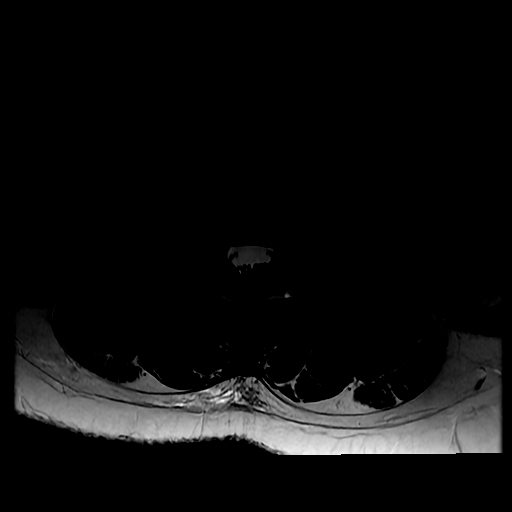
[im 22/32]
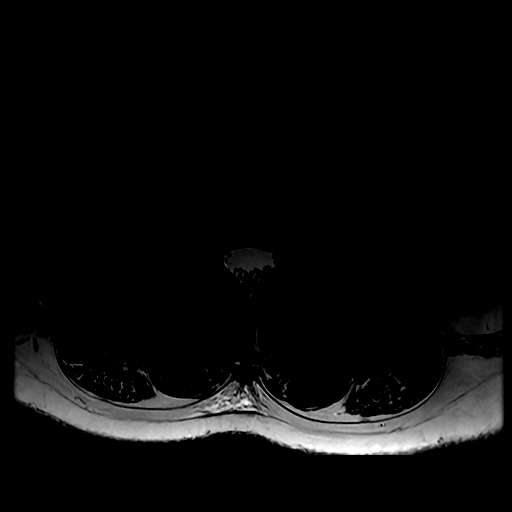
[im 27/32]
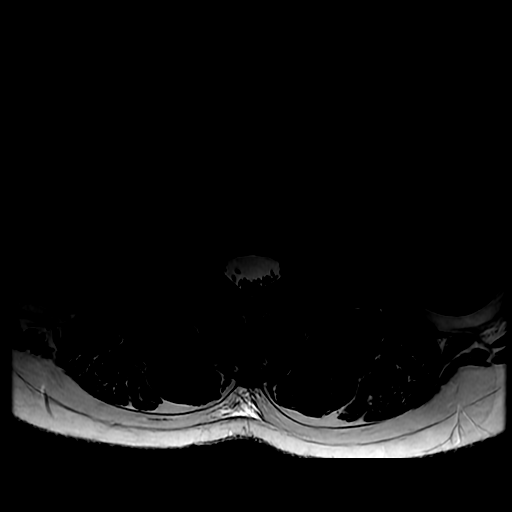

[Series 6: T1 · axial · 4.0mm · 0.39mm/px · z∈[-44,+86]mm · 3 of 32 slices shown (2 of 2)]
[im 5/32]
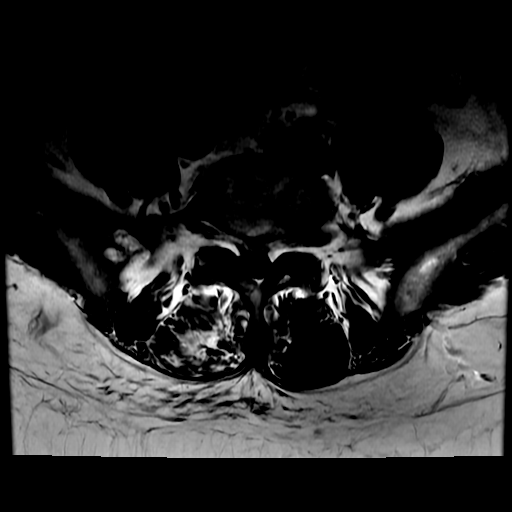
[im 17/32]
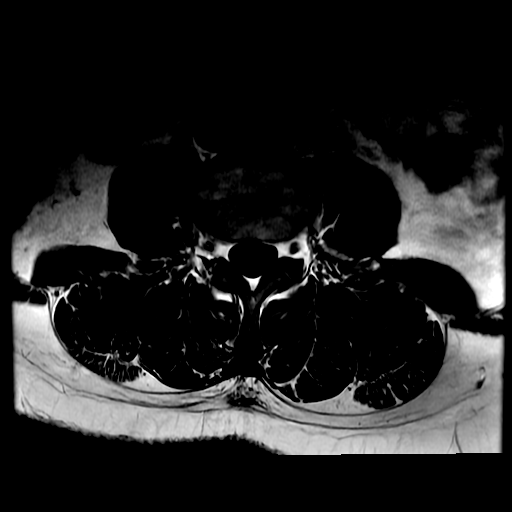
[im 27/32]
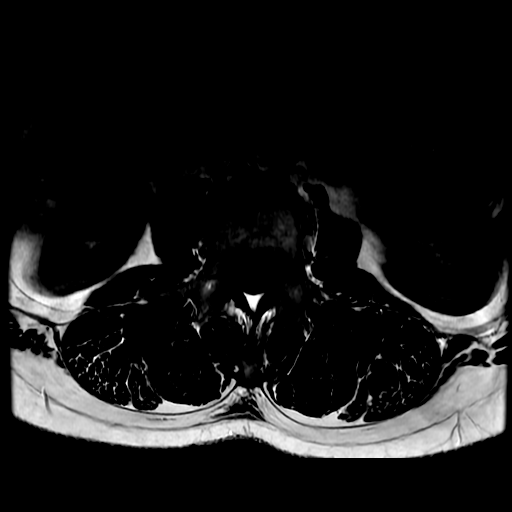

[19 of 48 positions shown; findings below may reference images not displayed]

FINDINGS: Segmentation:  Normal

Alignment:  Normal

Vertebrae:  Normal bone marrow.  Negative for fracture or mass.

Conus medullaris and cauda equina: Conus extends to the L1-2 level.
Conus and cauda equina appear normal.

Paraspinal and other soft tissues: Negative for paraspinous mass or
adenopathy

Disc levels:

L1-2: Negative

L2-3: Negative

L3-4: Slight disc and facet degeneration.  Negative for stenosis

L4-5: Moderately large central disc protrusion, eccentric to the
right. There is mild facet and ligamentum flavum hypertrophy. Mild
to moderate spinal stenosis. Subarticular stenosis on the right with
expected impingement of the right L5 nerve root.

L5-S1: Small central disc protrusion and mild disc degeneration.
Mild facet degeneration. Negative for neural impingement.
IMPRESSION: Moderately large central and right-sided disc protrusion L4-5
causing mild to moderate spinal stenosis and right sided nerve root
impingement in the subarticular zone.

## 2022-05-21 DIAGNOSIS — M130 Polyarthritis, unspecified: Secondary | ICD-10-CM | POA: Diagnosis not present

## 2022-05-21 DIAGNOSIS — I10 Essential (primary) hypertension: Secondary | ICD-10-CM | POA: Diagnosis not present

## 2022-05-21 DIAGNOSIS — K219 Gastro-esophageal reflux disease without esophagitis: Secondary | ICD-10-CM | POA: Diagnosis not present

## 2022-05-21 DIAGNOSIS — F330 Major depressive disorder, recurrent, mild: Secondary | ICD-10-CM | POA: Diagnosis not present

## 2022-07-06 DIAGNOSIS — I10 Essential (primary) hypertension: Secondary | ICD-10-CM | POA: Diagnosis not present

## 2022-07-06 DIAGNOSIS — R059 Cough, unspecified: Secondary | ICD-10-CM | POA: Diagnosis not present

## 2022-07-06 DIAGNOSIS — K219 Gastro-esophageal reflux disease without esophagitis: Secondary | ICD-10-CM | POA: Diagnosis not present

## 2022-07-06 DIAGNOSIS — J029 Acute pharyngitis, unspecified: Secondary | ICD-10-CM | POA: Diagnosis not present

## 2022-08-25 DIAGNOSIS — K581 Irritable bowel syndrome with constipation: Secondary | ICD-10-CM | POA: Diagnosis not present

## 2022-09-20 DIAGNOSIS — N951 Menopausal and female climacteric states: Secondary | ICD-10-CM | POA: Diagnosis not present

## 2022-09-20 DIAGNOSIS — N644 Mastodynia: Secondary | ICD-10-CM | POA: Diagnosis not present

## 2022-09-21 ENCOUNTER — Other Ambulatory Visit: Payer: Self-pay | Admitting: Obstetrics and Gynecology

## 2022-09-21 DIAGNOSIS — N644 Mastodynia: Secondary | ICD-10-CM

## 2022-09-23 ENCOUNTER — Encounter: Payer: Self-pay | Admitting: Obstetrics and Gynecology

## 2022-09-23 DIAGNOSIS — F330 Major depressive disorder, recurrent, mild: Secondary | ICD-10-CM | POA: Diagnosis not present

## 2022-09-23 DIAGNOSIS — M130 Polyarthritis, unspecified: Secondary | ICD-10-CM | POA: Diagnosis not present

## 2022-09-23 DIAGNOSIS — Z0001 Encounter for general adult medical examination with abnormal findings: Secondary | ICD-10-CM | POA: Diagnosis not present

## 2022-09-24 DIAGNOSIS — I10 Essential (primary) hypertension: Secondary | ICD-10-CM | POA: Diagnosis not present

## 2022-09-24 DIAGNOSIS — E785 Hyperlipidemia, unspecified: Secondary | ICD-10-CM | POA: Diagnosis not present

## 2022-09-24 DIAGNOSIS — R7303 Prediabetes: Secondary | ICD-10-CM | POA: Diagnosis not present

## 2022-09-24 DIAGNOSIS — Z0001 Encounter for general adult medical examination with abnormal findings: Secondary | ICD-10-CM | POA: Diagnosis not present

## 2022-09-28 DIAGNOSIS — R0600 Dyspnea, unspecified: Secondary | ICD-10-CM | POA: Diagnosis not present

## 2022-09-28 DIAGNOSIS — Z6830 Body mass index (BMI) 30.0-30.9, adult: Secondary | ICD-10-CM | POA: Diagnosis not present

## 2022-09-28 DIAGNOSIS — E7800 Pure hypercholesterolemia, unspecified: Secondary | ICD-10-CM | POA: Diagnosis not present

## 2022-09-28 DIAGNOSIS — R0602 Shortness of breath: Secondary | ICD-10-CM | POA: Diagnosis not present

## 2022-09-28 DIAGNOSIS — E669 Obesity, unspecified: Secondary | ICD-10-CM | POA: Diagnosis not present

## 2022-09-28 DIAGNOSIS — I10 Essential (primary) hypertension: Secondary | ICD-10-CM | POA: Diagnosis not present

## 2022-09-28 DIAGNOSIS — Z87891 Personal history of nicotine dependence: Secondary | ICD-10-CM | POA: Diagnosis not present

## 2022-10-16 DIAGNOSIS — R140 Abdominal distension (gaseous): Secondary | ICD-10-CM | POA: Diagnosis not present

## 2022-10-16 DIAGNOSIS — R109 Unspecified abdominal pain: Secondary | ICD-10-CM | POA: Diagnosis not present

## 2022-10-16 DIAGNOSIS — K5900 Constipation, unspecified: Secondary | ICD-10-CM | POA: Diagnosis not present

## 2022-10-16 DIAGNOSIS — K580 Irritable bowel syndrome with diarrhea: Secondary | ICD-10-CM | POA: Diagnosis not present

## 2022-10-25 DIAGNOSIS — M65311 Trigger thumb, right thumb: Secondary | ICD-10-CM | POA: Diagnosis not present

## 2022-11-23 DIAGNOSIS — Z87440 Personal history of urinary (tract) infections: Secondary | ICD-10-CM | POA: Diagnosis not present

## 2022-11-23 DIAGNOSIS — M545 Low back pain, unspecified: Secondary | ICD-10-CM | POA: Diagnosis not present

## 2022-11-23 DIAGNOSIS — N390 Urinary tract infection, site not specified: Secondary | ICD-10-CM | POA: Diagnosis not present

## 2022-12-03 DIAGNOSIS — M5416 Radiculopathy, lumbar region: Secondary | ICD-10-CM | POA: Diagnosis not present

## 2023-01-07 DIAGNOSIS — M545 Low back pain, unspecified: Secondary | ICD-10-CM | POA: Diagnosis not present

## 2023-01-07 DIAGNOSIS — I10 Essential (primary) hypertension: Secondary | ICD-10-CM | POA: Diagnosis not present

## 2023-01-07 DIAGNOSIS — R7303 Prediabetes: Secondary | ICD-10-CM | POA: Diagnosis not present

## 2023-01-07 DIAGNOSIS — K219 Gastro-esophageal reflux disease without esophagitis: Secondary | ICD-10-CM | POA: Diagnosis not present

## 2023-01-07 DIAGNOSIS — E785 Hyperlipidemia, unspecified: Secondary | ICD-10-CM | POA: Diagnosis not present

## 2023-01-26 DIAGNOSIS — M5126 Other intervertebral disc displacement, lumbar region: Secondary | ICD-10-CM | POA: Diagnosis not present

## 2023-01-26 DIAGNOSIS — Z6835 Body mass index (BMI) 35.0-35.9, adult: Secondary | ICD-10-CM | POA: Diagnosis not present

## 2023-02-10 ENCOUNTER — Encounter (HOSPITAL_COMMUNITY): Payer: Self-pay

## 2023-02-10 ENCOUNTER — Emergency Department (HOSPITAL_BASED_OUTPATIENT_CLINIC_OR_DEPARTMENT_OTHER): Payer: BC Managed Care – PPO

## 2023-02-10 ENCOUNTER — Emergency Department (HOSPITAL_COMMUNITY)
Admission: EM | Admit: 2023-02-10 | Discharge: 2023-02-10 | Discharge status: Against Medical Advice | Payer: BC Managed Care – PPO | Attending: Emergency Medicine | Admitting: Emergency Medicine

## 2023-02-10 ENCOUNTER — Other Ambulatory Visit: Payer: Self-pay

## 2023-02-10 DIAGNOSIS — R609 Edema, unspecified: Secondary | ICD-10-CM | POA: Diagnosis not present

## 2023-02-10 DIAGNOSIS — M79662 Pain in left lower leg: Secondary | ICD-10-CM | POA: Insufficient documentation

## 2023-02-10 DIAGNOSIS — M5416 Radiculopathy, lumbar region: Secondary | ICD-10-CM | POA: Insufficient documentation

## 2023-02-10 DIAGNOSIS — M5126 Other intervertebral disc displacement, lumbar region: Secondary | ICD-10-CM | POA: Diagnosis not present

## 2023-02-10 DIAGNOSIS — M545 Low back pain, unspecified: Secondary | ICD-10-CM | POA: Diagnosis not present

## 2023-02-10 DIAGNOSIS — M5417 Radiculopathy, lumbosacral region: Secondary | ICD-10-CM

## 2023-02-10 MED ORDER — MELOXICAM 7.5 MG PO TABS: 7.5000 mg | ORAL_TABLET | Freq: Every day | ORAL | 0 refills | Status: AC

## 2023-02-10 MED ORDER — DIAZEPAM 2 MG PO TABS
2.0000 mg | ORAL_TABLET | Freq: Once | ORAL | Status: AC
  Administered 2023-02-10: 2 mg via ORAL
  Filled 2023-02-10: qty 1

## 2023-02-10 MED ORDER — ACETAMINOPHEN 325 MG PO TABS
650.0000 mg | ORAL_TABLET | Freq: Once | ORAL | Status: AC
  Administered 2023-02-10: 650 mg via ORAL
  Filled 2023-02-10: qty 2

## 2023-02-10 MED ORDER — NAPROXEN 250 MG PO TABS
500.0000 mg | ORAL_TABLET | Freq: Once | ORAL | Status: AC
  Administered 2023-02-10: 500 mg via ORAL
  Filled 2023-02-10: qty 2

## 2023-02-10 NOTE — ED Provider Notes (Addendum)
[place] EMERGENCY DEPARTMENT AT [facility] Provider Note   CSN: [id number] Arrival date & time: 02/10/23  0740     History  No chief complaint on file.   Laurie Reyes is a 62 y.o. female.  HPI    62 year old female with history of lumbar spine herniated disc comes in with chief complaint of calf pain and back pain.  Patient states that her back pain has been bothering more so over the last 1 month.  Over the last few days she has noted that the pain is radiating down both of her legs.  In the last 2 days she has been having calf pain, left worse than right.  She also notes increased swelling to her calf.  Pt has no associated NEW numbness, weakness, urinary incontinence, urinary retention, bowel incontinence, pins and needle sensation in the perineal area.  For her spine, patient follows up with Dr. Conchita Paris.  She is supposed to get an epidural tomorrow.   Home Medications Prior to Admission medications   Medication Sig Start Date End Date Taking? Authorizing Provider  baclofen (LIORESAL) 10 MG tablet Take 10 mg by mouth 2 (two) times daily. 11/23/22  Yes [provider]  LINZESS 145 MCG CAPS capsule Take 145 mcg by mouth daily. 12/21/22  Yes [provider]  amLODipine (NORVASC) 5 MG tablet Take 5 mg by mouth daily. 01/31/20   [provider]  DULoxetine (CYMBALTA) 60 MG capsule TAKE (1) CAPSULE BY MOUTH TWICE DAILY. 12/08/20   Myrlene Broker, MD  EPINEPHrine (EPI-PEN) 0.3 mg/0.3 mL SOAJ injection  03/06/13   [provider]  ergocalciferol (VITAMIN D2) 1.25 MG (50000 UT) capsule Vitamin D2 1,250 mcg (50,000 unit) capsule  Take 1 capsule every month by oral route. Patient not taking: Reported on 10/20/2020 11/26/17   [provider]  meloxicam (MOBIC) 7.5 MG tablet Take 1 tablet (7.5 mg total) by mouth daily. 02/10/23   Derwood Kaplan, MD  pantoprazole (PROTONIX) 40 MG tablet Take 40 mg by mouth 2 (two) times daily.  03/16/13   [provider]  traMADol (ULTRAM) 50 MG tablet tramadol 50 mg tablet  Take 2 tablets twice a day by oral route as needed.  Take 1-2 twice a day as needed    [provider]  traZODone (DESYREL) 100 MG tablet Take 2 tablets (200 mg total) by mouth at bedtime. 12/08/20   Myrlene Broker, MD      Allergies    Penicillins, Shellfish allergy, and Sulfa antibiotics    Review of Systems   Review of Systems  All other systems reviewed and are negative.   Physical Exam Updated Vital Signs BP (!) 144/114   Pulse 73   Temp 97.7 F (36.5 C) (Oral)   Resp 20   Ht 5\' 5"  (1.651 m)   Wt 97.5 kg   SpO2 100%   BMI 35.78 kg/m  Physical Exam Vitals and nursing note reviewed.  Constitutional:      Appearance: She is well-developed.  HENT:     Head: Atraumatic.  Eyes:     Extraocular Movements: Extraocular movements intact.     Pupils: Pupils are equal, round, and reactive to light.  Cardiovascular:     Rate and Rhythm: Normal rate.  Pulmonary:     Effort: Pulmonary effort is normal.  Musculoskeletal:     Cervical back: Neck supple.     Comments: Reproducible tenderness over the L5, S1 region. Positive passive leg raise with both  extremities at about 30 degrees Gross sensory exam is equal and reassuring.  Motor strength is 4+ out of 5 bilaterally  Skin:    General: Skin is warm.  Neurological:     Mental Status: She is alert and oriented to person, place, and time.     ED Results / Procedures / Treatments   Labs (all labs ordered are listed, but only abnormal results are displayed) Labs Reviewed - No data to display  EKG None  Radiology VAS [place] LOWER EXTREMITY VENOUS (DVT) (7a-7p)  Result Date: 02/10/2023  Lower Venous DVT Study Patient Name:  MONZERAT MICHAUX  Date of Exam:   02/10/2023 Medical Rec #: [id number]               Accession #:    [id number] Date of Birth: 1960/11/25                Patient Gender: F Patient Age:   12 years Exam  Location:  [facility] Procedure:      VAS [place] LOWER EXTREMITY VENOUS (DVT) Referring Phys: Janey Genta Arieliz Latino --------------------------------------------------------------------------------  Indications: Pain, and Swelling.  Comparison Study: No prior study on file Performing Technologist: Sherren Kerns RVS  Examination Guidelines: A complete evaluation includes B-mode imaging, spectral Doppler, color Doppler, and power Doppler as needed of all accessible portions of each vessel. Bilateral testing is considered an integral part of a complete examination. Limited examinations for reoccurring indications may be performed as noted. The reflux portion of the exam is performed with the patient in reverse Trendelenburg.  +-----+---------------+---------+-----------+----------+--------------+ RIGHTCompressibilityPhasicitySpontaneityPropertiesThrombus Aging +-----+---------------+---------+-----------+----------+--------------+ CFV  Full           Yes      Yes                                 +-----+---------------+---------+-----------+----------+--------------+   +---------+---------------+---------+-----------+----------+--------------+ LEFT     CompressibilityPhasicitySpontaneityPropertiesThrombus Aging +---------+---------------+---------+-----------+----------+--------------+ CFV      Full           Yes      Yes                                 +---------+---------------+---------+-----------+----------+--------------+ SFJ      Full                                                        +---------+---------------+---------+-----------+----------+--------------+ FV Prox  Full                                                        +---------+---------------+---------+-----------+----------+--------------+ FV Mid   Full                                                        +---------+---------------+---------+-----------+----------+--------------+ FV DistalFull                                                         +---------+---------------+---------+-----------+----------+--------------+  PFV      Full                                                        +---------+---------------+---------+-----------+----------+--------------+ POP      Full           Yes      Yes                                 +---------+---------------+---------+-----------+----------+--------------+ PTV      Full                                                        +---------+---------------+---------+-----------+----------+--------------+ PERO     Full                                                        +---------+---------------+---------+-----------+----------+--------------+     Summary: RIGHT: - No evidence of common femoral vein obstruction.  LEFT: - No evidence of deep vein thrombosis in the lower extremity. No indirect evidence of obstruction proximal to the inguinal ligament. - No cystic structure found in the popliteal fossa.  *See table(s) above for measurements and observations. Electronically signed by Lemar Livings MD on 02/10/2023 at 9:53:36 AM.    Final     Procedures Procedures    Medications Ordered in ED Medications  naproxen (NAPROSYN) tablet 500 mg (500 mg Oral Given 02/10/23 0848)  acetaminophen (TYLENOL) tablet 650 mg (650 mg Oral Given 02/10/23 0848)  diazepam (VALIUM) tablet 2 mg (2 mg Oral Given 02/10/23 0848)    ED Course/ Medical Decision Making/ A&P                                 Medical Decision Making Risk OTC drugs. Prescription drug management.   62 year old female with history of degenerative spine disease comes with a chief complaint of calf pain, back pain.  Differential diagnosis considered for this patient includes: - DJD of the back - Spondylitises/ spondylosis - Myelitis - Musculoskeletal pain -DVT  Ultrasound DVT has been ordered.  Clinically, I have a higher suspicion that the pain is because of  worsening degenerative spine disease as she has pain radiating down both of her legs over the last few days and in the last 2 days she has been having pain in both of her calves.  There is mild edema of the leg, therefore DVT study is still a good idea.  I consulted neurosurgery and spoke with Maralyn Sago, NP with neurosurgery.  She recommended that patient mention her symptoms tomorrow when she sees neurosurgery team for her epidural.  We should also make patient aware that the epidural could be delayed if there is concerns of worsening disease.  Patient made aware of this recommendation.  I have sent Mobic 7.5 mg to her pharmacy at her request.   Final Clinical Impression(s) /  ED Diagnoses Final diagnoses:  Lumbar herniated disc  Lumbosacral radiculopathy    Rx / DC Orders ED Discharge Orders          Ordered    meloxicam (MOBIC) 7.5 MG tablet  Daily        02/10/23 1048              Derwood Kaplan, MD 02/10/23 1048    Derwood Kaplan, MD 02/10/23 1048

## 2023-02-10 NOTE — ED Notes (Signed)
Transported to vascular. 

## 2023-02-10 NOTE — Progress Notes (Signed)
VASCULAR LAB    Left lower extremity venous duplex has been performed.  See CV proc for preliminary results.   Messaged negative results to Dr. Rhunette Croft via secure chat  Sherren Kerns, RVT 02/10/2023, 9:12 AM\

## 2023-02-10 NOTE — ED Triage Notes (Signed)
Pt woke this am with pain in bilateral calves, endorses swelling in L calf; denies known injury, no DVT history; also c/o low back pain, hx bulging disc, htn

## 2023-02-10 NOTE — Discharge Instructions (Addendum)
You were seen in the emergency room for calf pain and back pain.  The ultrasound is negative for DVT or blood clots. We suspect that your pain is because of worsening degenerative spine disease.  We discussed the case with neurosurgery team.  They would like for you to discuss this matter at your visit tomorrow.  Please be aware, that neurosurgery team might decide that epidural is not the right therapy based on their assessment.  Take ibuprofen for pain management.

## 2023-02-11 DIAGNOSIS — M5416 Radiculopathy, lumbar region: Secondary | ICD-10-CM | POA: Diagnosis not present

## 2023-02-22 ENCOUNTER — Telehealth: Payer: Self-pay

## 2023-02-22 DIAGNOSIS — I10 Essential (primary) hypertension: Secondary | ICD-10-CM | POA: Diagnosis not present

## 2023-02-22 DIAGNOSIS — M5126 Other intervertebral disc displacement, lumbar region: Secondary | ICD-10-CM | POA: Diagnosis not present

## 2023-02-22 DIAGNOSIS — F330 Major depressive disorder, recurrent, mild: Secondary | ICD-10-CM | POA: Diagnosis not present

## 2023-02-22 NOTE — Telephone Encounter (Signed)
Transition Care Management Unsuccessful Follow-up Telephone Call  Date of discharge and from where:  02/10/2023 [facility]  Attempts:  1st Attempt  Reason for unsuccessful TCM follow-up call:  No answer/busy  [name]  [facility] Population Health Community Resource Care Guide   [email or web address]  ?? [phone]   Website: [email or web address]  [email or web address]

## 2023-02-23 ENCOUNTER — Telehealth: Payer: Self-pay

## 2023-02-23 NOTE — Telephone Encounter (Signed)
Transition Care Management Follow-up Telephone Call Date of discharge and from where: 02/10/2023 [facility] How have you been since you were released from the hospital? Patient stated she is not feeling any better and is still in pain. Any questions or concerns? No  Items Reviewed: Did the pt receive and understand the discharge instructions provided? Yes  Medications obtained and verified?  No medication prescribed. Other? No  Any new allergies since your discharge? No  Dietary orders reviewed? Yes Do you have support at home? Yes   Follow up appointments reviewed:  PCP Hospital f/u appt confirmed? No  Scheduled to see  on  @ . Specialist Hospital f/u appt confirmed?  Patient stated she has an upcoming appointment with Laurie Hoehn, MD  Scheduled to see  on  @ . Are transportation arrangements needed? No  If their condition worsens, is the pt aware to call PCP or go to the Emergency Dept.? Yes Was the patient provided with contact information for the PCP's office or ED? Yes Was to pt encouraged to call back with questions or concerns? Yes  Laurie Reyes [facility]  [facility] Population Health Community Resource Care Guide   ??[email or web address]  ?? [phone]   Website: [email or web address]  [email or web address]

## 2023-02-24 DIAGNOSIS — Z6835 Body mass index (BMI) 35.0-35.9, adult: Secondary | ICD-10-CM | POA: Diagnosis not present

## 2023-02-24 DIAGNOSIS — M5416 Radiculopathy, lumbar region: Secondary | ICD-10-CM | POA: Diagnosis not present

## 2023-02-24 DIAGNOSIS — M5126 Other intervertebral disc displacement, lumbar region: Secondary | ICD-10-CM | POA: Diagnosis not present

## 2023-03-02 ENCOUNTER — Other Ambulatory Visit: Payer: Self-pay | Admitting: Neurosurgery

## 2023-03-02 DIAGNOSIS — M5126 Other intervertebral disc displacement, lumbar region: Secondary | ICD-10-CM

## 2023-03-07 DIAGNOSIS — Z01419 Encounter for gynecological examination (general) (routine) without abnormal findings: Secondary | ICD-10-CM | POA: Diagnosis not present

## 2023-03-07 DIAGNOSIS — Z1151 Encounter for screening for human papillomavirus (HPV): Secondary | ICD-10-CM | POA: Diagnosis not present

## 2023-03-07 DIAGNOSIS — Z124 Encounter for screening for malignant neoplasm of cervix: Secondary | ICD-10-CM | POA: Diagnosis not present

## 2023-03-08 DIAGNOSIS — Z87891 Personal history of nicotine dependence: Secondary | ICD-10-CM | POA: Diagnosis not present

## 2023-03-08 DIAGNOSIS — Z79899 Other long term (current) drug therapy: Secondary | ICD-10-CM | POA: Diagnosis not present

## 2023-03-08 DIAGNOSIS — Z6830 Body mass index (BMI) 30.0-30.9, adult: Secondary | ICD-10-CM | POA: Diagnosis not present

## 2023-03-08 DIAGNOSIS — E785 Hyperlipidemia, unspecified: Secondary | ICD-10-CM | POA: Diagnosis not present

## 2023-03-08 DIAGNOSIS — E7800 Pure hypercholesterolemia, unspecified: Secondary | ICD-10-CM | POA: Diagnosis not present

## 2023-03-08 DIAGNOSIS — E7841 Elevated Lipoprotein(a): Secondary | ICD-10-CM | POA: Diagnosis not present

## 2023-03-08 DIAGNOSIS — I10 Essential (primary) hypertension: Secondary | ICD-10-CM | POA: Diagnosis not present

## 2023-03-08 DIAGNOSIS — E669 Obesity, unspecified: Secondary | ICD-10-CM | POA: Diagnosis not present

## 2023-03-08 DIAGNOSIS — R0602 Shortness of breath: Secondary | ICD-10-CM | POA: Diagnosis not present

## 2023-03-08 DIAGNOSIS — Z7182 Exercise counseling: Secondary | ICD-10-CM | POA: Diagnosis not present

## 2023-03-11 ENCOUNTER — Ambulatory Visit
Admission: RE | Admit: 2023-03-11 | Discharge: 2023-03-11 | Disposition: A | Discharge status: Home / Self Care | Payer: BC Managed Care – PPO | Source: Ambulatory Visit | Attending: Neurosurgery | Admitting: Neurosurgery

## 2023-03-11 DIAGNOSIS — M48061 Spinal stenosis, lumbar region without neurogenic claudication: Secondary | ICD-10-CM | POA: Diagnosis not present

## 2023-03-11 DIAGNOSIS — M5126 Other intervertebral disc displacement, lumbar region: Secondary | ICD-10-CM

## 2023-03-14 DIAGNOSIS — M5126 Other intervertebral disc displacement, lumbar region: Secondary | ICD-10-CM | POA: Diagnosis not present

## 2023-03-17 ENCOUNTER — Ambulatory Visit: Admit: 2023-03-17 | Payer: BC Managed Care – PPO | Admitting: Neurosurgery

## 2023-03-17 SURGERY — LUMBAR LAMINECTOMY/DECOMPRESSION MICRODISCECTOMY 1 LEVEL
Anesthesia: General

## 2023-03-20 ENCOUNTER — Other Ambulatory Visit: Payer: BC Managed Care – PPO

## 2023-03-30 DIAGNOSIS — R0602 Shortness of breath: Secondary | ICD-10-CM | POA: Diagnosis not present

## 2023-03-30 DIAGNOSIS — I10 Essential (primary) hypertension: Secondary | ICD-10-CM | POA: Diagnosis not present

## 2023-03-30 DIAGNOSIS — E785 Hyperlipidemia, unspecified: Secondary | ICD-10-CM | POA: Diagnosis not present

## 2023-03-30 DIAGNOSIS — E669 Obesity, unspecified: Secondary | ICD-10-CM | POA: Diagnosis not present

## 2023-04-04 ENCOUNTER — Other Ambulatory Visit: Payer: Self-pay | Admitting: Neurosurgery

## 2023-04-07 ENCOUNTER — Other Ambulatory Visit: Payer: Self-pay | Admitting: Neurosurgery

## 2023-04-07 NOTE — Pre-Procedure Instructions (Signed)
Surgical Instructions   Your procedure is scheduled on April 11, 2023. Report to [facility] Main Entrance "A" at 5:30 A.M., then check in with the Admitting office. Any questions or running late day of surgery: call [phone]  Questions prior to your surgery date: call [phone], Monday-Friday, 8am-4pm. If you experience any cold or flu symptoms such as cough, fever, chills, shortness of breath, etc. between now and your scheduled surgery, please notify us at the above number.     Remember:  Do not eat or drink after midnight the night before your surgery    Take these medicines the morning of surgery with A SIP OF WATER: acetaminophen (TYLENOL)  amLODipine (NORVASC)  atorvastatin (LIPITOR)  DULoxetine (CYMBALTA)  ezetimibe (ZETIA)  pantoprazole (PROTONIX)    May take these medicines IF NEEDED: EPINEPHrine (EPI-PEN)    One week prior to surgery, STOP taking any Aspirin (unless otherwise instructed by your surgeon) Aleve, Naproxen, Ibuprofen, Motrin, Advil, Goody's, BC's, all herbal medications, fish oil, and non-prescription vitamins. This includes your medication: meloxicam (MOBIC)                      Do NOT Smoke (Tobacco/Vaping) for 24 hours prior to your procedure.  If you use a CPAP at night, you may bring your mask/headgear for your overnight stay.   You will be asked to remove any contacts, glasses, piercing's, hearing aid's, dentures/partials prior to surgery. Please bring cases for these items if needed.    Patients discharged the day of surgery will not be allowed to drive home, and someone needs to stay with them for 24 hours.  SURGICAL WAITING ROOM VISITATION Patients may have no more than 2 support people in the waiting area - these visitors may rotate.   Pre-op nurse will coordinate an appropriate time for 1 ADULT support person, who may not rotate, to accompany patient in pre-op.  Children under the age of 35 must have an adult with them who is not  the patient and must remain in the main waiting area with an adult.  If the patient needs to stay at the hospital during part of their recovery, the visitor guidelines for inpatient rooms apply.  Please refer to the [facility] website for the visitor guidelines for any additional information.   If you received a COVID test during your pre-op visit  it is requested that you wear a mask when out in public, stay away from anyone that may not be feeling well and notify your surgeon if you develop symptoms. If you have been in contact with anyone that has tested positive in the last 10 days please notify you surgeon.      Pre-operative 5 CHG Bathing Instructions   You can play a key role in reducing the risk of infection after surgery. Your skin needs to be as free of germs as possible. You can reduce the number of germs on your skin by washing with CHG (chlorhexidine gluconate) soap before surgery. CHG is an antiseptic soap that kills germs and continues to kill germs even after washing.   DO NOT use if you have an allergy to chlorhexidine/CHG or antibacterial soaps. If your skin becomes reddened or irritated, stop using the CHG and notify one of our RNs at [phone].   Please shower with the CHG soap starting 4 days before surgery using the following schedule:     Please keep in mind the following:  DO NOT shave, including legs and underarms,  starting the day of your first shower.   You may shave your face at any point before/day of surgery.  Place clean sheets on your bed the day you start using CHG soap. Use a clean washcloth (not used since being washed) for each shower. DO NOT sleep with pets once you start using the CHG.   CHG Shower Instructions:  Wash your face and private area with normal soap. If you choose to wash your hair, wash first with your normal shampoo.  After you use shampoo/soap, rinse your hair and body thoroughly to remove shampoo/soap residue.  Turn the water  OFF and apply about 3 tablespoons (45 ml) of CHG soap to a CLEAN washcloth.  Apply CHG soap ONLY FROM YOUR NECK DOWN TO YOUR TOES (washing for 3-5 minutes)  DO NOT use CHG soap on face, private areas, open wounds, or sores.  Pay special attention to the area where your surgery is being performed.  If you are having back surgery, having someone wash your back for you may be helpful. Wait 2 minutes after CHG soap is applied, then you may rinse off the CHG soap.  Pat dry with a clean towel  Put on clean clothes/pajamas   If you choose to wear lotion, please use ONLY the CHG-compatible lotions on the back of this paper.   Additional instructions for the day of surgery: DO NOT APPLY any lotions, deodorants, cologne, or perfumes.   Do not bring valuables to the hospital. [facility] is not responsible for any belongings/valuables. Do not wear nail polish, gel polish, artificial nails, or any other type of covering on natural nails (fingers and toes) Do not wear jewelry or makeup Put on clean/comfortable clothes.  Please brush your teeth.  Ask your nurse before applying any prescription medications to the skin.     CHG Compatible Lotions   Aveeno Moisturizing lotion  Cetaphil Moisturizing Cream  Cetaphil Moisturizing Lotion  Clairol Herbal Essence Moisturizing Lotion, Dry Skin  Clairol Herbal Essence Moisturizing Lotion, Extra Dry Skin  Clairol Herbal Essence Moisturizing Lotion, Normal Skin  Curel Age Defying Therapeutic Moisturizing Lotion with Alpha Hydroxy  Curel Extreme Care Body Lotion  Curel Soothing Hands Moisturizing Hand Lotion  Curel Therapeutic Moisturizing Cream, Fragrance-Free  Curel Therapeutic Moisturizing Lotion, Fragrance-Free  Curel Therapeutic Moisturizing Lotion, Original Formula  Eucerin Daily Replenishing Lotion  Eucerin Dry Skin Therapy Plus Alpha Hydroxy Crme  Eucerin Dry Skin Therapy Plus Alpha Hydroxy Lotion  Eucerin Original Crme  Eucerin Original  Lotion  Eucerin Plus Crme Eucerin Plus Lotion  Eucerin TriLipid Replenishing Lotion  Keri Anti-Bacterial Hand Lotion  Keri Deep Conditioning Original Lotion Dry Skin Formula Softly Scented  Keri Deep Conditioning Original Lotion, Fragrance Free Sensitive Skin Formula  Keri Lotion Fast Absorbing Fragrance Free Sensitive Skin Formula  Keri Lotion Fast Absorbing Softly Scented Dry Skin Formula  Keri Original Lotion  Keri Skin Renewal Lotion Keri Silky Smooth Lotion  Keri Silky Smooth Sensitive Skin Lotion  Nivea Body Creamy Conditioning Oil  Nivea Body Extra Enriched Lotion  Nivea Body Original Lotion  Nivea Body Sheer Moisturizing Lotion Nivea Crme  Nivea Skin Firming Lotion  NutraDerm 30 Skin Lotion  NutraDerm Skin Lotion  NutraDerm Therapeutic Skin Cream  NutraDerm Therapeutic Skin Lotion  ProShield Protective Hand Cream  Provon moisturizing lotion  Please read over the following fact sheets that you were given.

## 2023-04-08 ENCOUNTER — Other Ambulatory Visit: Payer: Self-pay

## 2023-04-08 ENCOUNTER — Encounter (HOSPITAL_COMMUNITY)
Admission: RE | Admit: 2023-04-08 | Discharge: 2023-04-08 | Disposition: A | Discharge status: Home / Self Care | Payer: BC Managed Care – PPO | Source: Ambulatory Visit | Attending: Neurosurgery | Admitting: Neurosurgery

## 2023-04-08 ENCOUNTER — Encounter (HOSPITAL_COMMUNITY): Payer: Self-pay

## 2023-04-08 VITALS — BP 132/66 | HR 87 | Temp 98.0°F | Resp 18 | Ht 65.0 in | Wt 223.5 lb

## 2023-04-08 DIAGNOSIS — J45909 Unspecified asthma, uncomplicated: Secondary | ICD-10-CM | POA: Diagnosis not present

## 2023-04-08 DIAGNOSIS — Z01812 Encounter for preprocedural laboratory examination: Secondary | ICD-10-CM | POA: Insufficient documentation

## 2023-04-08 DIAGNOSIS — K219 Gastro-esophageal reflux disease without esophagitis: Secondary | ICD-10-CM | POA: Diagnosis not present

## 2023-04-08 DIAGNOSIS — R7303 Prediabetes: Secondary | ICD-10-CM | POA: Insufficient documentation

## 2023-04-08 DIAGNOSIS — R0609 Other forms of dyspnea: Secondary | ICD-10-CM | POA: Diagnosis not present

## 2023-04-08 DIAGNOSIS — E785 Hyperlipidemia, unspecified: Secondary | ICD-10-CM | POA: Insufficient documentation

## 2023-04-08 DIAGNOSIS — I10 Essential (primary) hypertension: Secondary | ICD-10-CM | POA: Insufficient documentation

## 2023-04-08 DIAGNOSIS — I2510 Atherosclerotic heart disease of native coronary artery without angina pectoris: Secondary | ICD-10-CM | POA: Insufficient documentation

## 2023-04-08 DIAGNOSIS — Z01818 Encounter for other preprocedural examination: Secondary | ICD-10-CM

## 2023-04-08 DIAGNOSIS — Z01810 Encounter for preprocedural cardiovascular examination: Secondary | ICD-10-CM | POA: Diagnosis not present

## 2023-04-08 HISTORY — DX: Unspecified asthma, uncomplicated: J45.909

## 2023-04-08 HISTORY — DX: Prediabetes: R73.03

## 2023-04-08 LAB — CBC
HCT: 39 % (ref 36.0–46.0)
Hemoglobin: 12.1 g/dL (ref 12.0–15.0)
MCH: 26.2 pg (ref 26.0–34.0)
MCHC: 31 g/dL (ref 30.0–36.0)
MCV: 84.6 fL (ref 80.0–100.0)
Platelets: 282 10*3/uL (ref 150–400)
RBC: 4.61 MIL/uL (ref 3.87–5.11)
RDW: 15.7 % — ABNORMAL HIGH (ref 11.5–15.5)
WBC: 5.4 10*3/uL (ref 4.0–10.5)
nRBC: 0 % (ref 0.0–0.2)

## 2023-04-08 LAB — SURGICAL PCR SCREEN
MRSA, PCR: NEGATIVE
Staphylococcus aureus: NEGATIVE

## 2023-04-08 NOTE — Plan of Care (Signed)
CHL Tonsillectomy/Adenoidectomy, Postoperative PEDS care plan entered in error.

## 2023-04-08 NOTE — Progress Notes (Signed)
Anesthesia Chart Review:  Follows with cardiology at [facility] for history of HLD, HTN, DOE.  Last seen by Dr. Lupita Shutter on 03/08/2023.  At that time the patient mentions some worsening DOE.  It was noted that she had previously normal stress echo in 2018 and that her symptoms are likely due to deconditioning, however, stress PET was ordered in light of possible upcoming back surgery.  Scan done 03/30/2023 showed normal myocardial perfusion and normal LV systolic function.  Patient was subsequently cleared for surgery based on this result.  Other pertinent history includes prediabetes A1c 6.4 on 03/08/2023, GERD, asthma.  Preop CBC reviewed, unremarkable.  BMP is still pending at this time.  EKG 04/08/2023: NSR with sinus arrhythmia.  Rate 77.  Cardiac stress PET 03/30/2023 (Care Everywhere): FINAL COMMENTS   Non-diagnostic CT obtained for attenuation correction: minimal coronary artery calcifications. Conclusions:.   1. Myocardial perfusion is normal without evidence of ischemia or infarct.   2 Normal left ventricular systolic function.   Stress echo 02/04/2017 (Care Everywhere): NORMAL STRESS TEST.   NORMAL LA PRESSURES WITH NORMAL DIASTOLIC FUNCTION   VALVULAR REGURGITATION: TRIVIAL MR, TRIVIAL PR   NO VALVULAR STENOSIS   NORMAL RESTING BP - APPROPRIATE RESPONSE     Zannie Cove [facility]/Anesthesiology Phone [phone] 04/08/2023 4:39 PM

## 2023-04-08 NOTE — Progress Notes (Signed)
BMP collected at pre-surgical appointment hemolyzed. Will need to be recollected DOS. Order placed.

## 2023-04-08 NOTE — Anesthesia Preprocedure Evaluation (Signed)
Anesthesia Evaluation  Patient identified by MRN, date of birth, ID band Patient awake    Reviewed: Allergy & Precautions, NPO status , Patient's Chart, lab work & pertinent test results  History of Anesthesia Complications Negative for: history of anesthetic complications  Airway Mallampati: II  TM Distance: >3 FB Neck ROM: Full    Dental  (+) Dental Advisory Given, Missing, Caps   Pulmonary asthma , former smoker   Pulmonary exam normal breath sounds clear to auscultation       Cardiovascular hypertension, Pt. on medications Normal cardiovascular exam Rhythm:Regular Rate:Normal     Neuro/Psych HNP, LUMBAR  negative psych ROS   GI/Hepatic Neg liver ROS,GERD  Medicated and Controlled,,  Endo/Other  Obesity   Renal/GU negative Renal ROS     Musculoskeletal negative musculoskeletal ROS (+)    Abdominal   Peds  (+) ATTENTION DEFICIT DISORDER WITHOUT HYPERACTIVITY Hematology negative hematology ROS (+)   Anesthesia Other Findings   Reproductive/Obstetrics                             Anesthesia Physical Anesthesia Plan  ASA: 2  Anesthesia Plan: General   Post-op Pain Management: Tylenol PO (pre-op)* and Gabapentin PO (pre-op)*   Induction: Intravenous  PONV Risk Score and Plan: 3 and Midazolam, Dexamethasone and Ondansetron  Airway Management Planned: Oral ETT  Additional Equipment:   Intra-op Plan:   Post-operative Plan: Extubation in OR  Informed Consent: I have reviewed the patients History and Physical, chart, labs and discussed the procedure including the risks, benefits and alternatives for the proposed anesthesia with the patient or authorized representative who has indicated his/her understanding and acceptance.     Dental advisory given  Plan Discussed with: CRNA  Anesthesia Plan Comments: (PAT note by Antionette Poles, PA-C: Follows with cardiology at [facility] for  history of HLD, HTN, DOE.  Last seen by Dr. Lupita Shutter on 03/08/2023.  At that time the patient mentions some worsening DOE.  It was noted that she had previously normal stress echo in 2018 and that her symptoms are likely due to deconditioning, however, stress PET was ordered in light of possible upcoming back surgery.  Scan done 03/30/2023 showed normal myocardial perfusion and normal LV systolic function.  Patient was subsequently cleared for surgery based on this result.  Other pertinent history includes prediabetes A1c 6.4 on 03/08/2023, GERD, asthma.  Preop CBC reviewed, unremarkable.  BMP is still pending at this time.  EKG 04/08/2023: NSR with sinus arrhythmia.  Rate 77.  Cardiac stress PET 03/30/2023 (Care Everywhere): FINAL COMMENTS  Non-diagnostic CT obtained for attenuation correction: minimal coronary artery calcifications. Conclusions:.  1. Myocardial perfusion is normal without evidence of ischemia or infarct.  2 Normal left ventricular systolic function.   Stress echo 02/04/2017 (Care Everywhere): NORMAL STRESS TEST.  NORMAL LA PRESSURES WITH NORMAL DIASTOLIC FUNCTION  VALVULAR REGURGITATION: TRIVIAL MR, TRIVIAL PR  NO VALVULAR STENOSIS  NORMAL RESTING BP - APPROPRIATE RESPONSE    )        Anesthesia Quick Evaluation

## 2023-04-08 NOTE — Progress Notes (Signed)
PCP - Benetta Spar Cardiologist - Dr. Youlanda Mighty  PPM/ICD - Denies Device Orders - n/a Rep Notified - n/a  Chest x-ray - n/a EKG - 04/08/2023 Stress Test - 03/30/2023 (CE) ECHO - 02/04/2017 Cardiac Cath - Denies  Sleep Study - Per pt, she has been tested before and it was negative but OSA score was 5 CPAP - n/a  Pt is Pre-DM  Last dose of GLP1 agonist- n/a GLP1 instructions: n/a  Blood Thinner Instructions: n/a Aspirin Instructions: n/a  NPO after midnight  COVID TEST- n/a   Anesthesia review: Yes. Cardiac Clearance.   Patient denies shortness of breath, fever, cough and chest pain at PAT appointment. Pt denies any respiratory illness/infection in the last two months.   All instructions explained to the patient, with a verbal understanding of the material. Patient agrees to go over the instructions while at home for a better understanding. Patient also instructed to self quarantine after being tested for COVID-19. The opportunity to ask questions was provided.

## 2023-04-11 ENCOUNTER — Ambulatory Visit (HOSPITAL_COMMUNITY)
Admission: RE | Disposition: A | Discharge status: Home / Self Care | Payer: Self-pay | Source: Home / Self Care | Attending: Neurosurgery

## 2023-04-11 ENCOUNTER — Ambulatory Visit (HOSPITAL_COMMUNITY): Payer: BC Managed Care – PPO

## 2023-04-11 ENCOUNTER — Ambulatory Visit (HOSPITAL_COMMUNITY): Payer: BC Managed Care – PPO | Admitting: Physician Assistant

## 2023-04-11 ENCOUNTER — Other Ambulatory Visit: Payer: Self-pay

## 2023-04-11 ENCOUNTER — Encounter (HOSPITAL_COMMUNITY): Payer: Self-pay | Admitting: Neurosurgery

## 2023-04-11 ENCOUNTER — Ambulatory Visit (HOSPITAL_COMMUNITY)
Admission: RE | Admit: 2023-04-11 | Discharge: 2023-04-11 | Disposition: A | Discharge status: Home / Self Care | Payer: BC Managed Care – PPO | Attending: Neurosurgery | Admitting: Neurosurgery

## 2023-04-11 ENCOUNTER — Ambulatory Visit (HOSPITAL_COMMUNITY): Payer: BC Managed Care – PPO | Admitting: Anesthesiology

## 2023-04-11 DIAGNOSIS — M5126 Other intervertebral disc displacement, lumbar region: Secondary | ICD-10-CM | POA: Diagnosis not present

## 2023-04-11 DIAGNOSIS — J45909 Unspecified asthma, uncomplicated: Secondary | ICD-10-CM | POA: Diagnosis not present

## 2023-04-11 DIAGNOSIS — Z79899 Other long term (current) drug therapy: Secondary | ICD-10-CM | POA: Diagnosis not present

## 2023-04-11 DIAGNOSIS — K219 Gastro-esophageal reflux disease without esophagitis: Secondary | ICD-10-CM | POA: Diagnosis not present

## 2023-04-11 DIAGNOSIS — E669 Obesity, unspecified: Secondary | ICD-10-CM | POA: Diagnosis not present

## 2023-04-11 DIAGNOSIS — M48061 Spinal stenosis, lumbar region without neurogenic claudication: Secondary | ICD-10-CM | POA: Diagnosis not present

## 2023-04-11 DIAGNOSIS — Z6836 Body mass index (BMI) 36.0-36.9, adult: Secondary | ICD-10-CM | POA: Insufficient documentation

## 2023-04-11 DIAGNOSIS — Z87891 Personal history of nicotine dependence: Secondary | ICD-10-CM | POA: Diagnosis not present

## 2023-04-11 DIAGNOSIS — I10 Essential (primary) hypertension: Secondary | ICD-10-CM | POA: Diagnosis not present

## 2023-04-11 DIAGNOSIS — M5116 Intervertebral disc disorders with radiculopathy, lumbar region: Secondary | ICD-10-CM | POA: Insufficient documentation

## 2023-04-11 DIAGNOSIS — Z9889 Other specified postprocedural states: Secondary | ICD-10-CM | POA: Diagnosis not present

## 2023-04-11 HISTORY — PX: LUMBAR LAMINECTOMY/DECOMPRESSION MICRODISCECTOMY: SHX5026

## 2023-04-11 LAB — BASIC METABOLIC PANEL
Anion gap: 10 (ref 5–15)
BUN: 16 mg/dL (ref 8–23)
CO2: 26 mmol/L (ref 22–32)
Calcium: 9.5 mg/dL (ref 8.9–10.3)
Chloride: 104 mmol/L (ref 98–111)
Creatinine, Ser: 0.81 mg/dL (ref 0.44–1.00)
GFR, Estimated: >60 mL/min (ref >60)
Glucose, Bld: 108 mg/dL — ABNORMAL HIGH (ref 70–99)
Potassium: 3.6 mmol/L (ref 3.5–5.1)
Sodium: 140 mmol/L (ref 135–145)

## 2023-04-11 SURGERY — LUMBAR LAMINECTOMY/DECOMPRESSION MICRODISCECTOMY 1 LEVEL
Anesthesia: General | Site: Spine Lumbar

## 2023-04-11 MED ORDER — LIDOCAINE-EPINEPHRINE 1 %-1:100000 IJ SOLN
INTRAMUSCULAR | Status: DC | PRN
  Administered 2023-04-11: 5 mL

## 2023-04-11 MED ORDER — AMISULPRIDE (ANTIEMETIC) 5 MG/2ML IV SOLN: 10.0000 mg | Freq: Once | INTRAVENOUS | Status: DC | PRN

## 2023-04-11 MED ORDER — ROCURONIUM BROMIDE 10 MG/ML (PF) SYRINGE
PREFILLED_SYRINGE | INTRAVENOUS | Status: DC | PRN
  Administered 2023-04-11: 20 mg via INTRAVENOUS
  Administered 2023-04-11: 50 mg via INTRAVENOUS
  Administered 2023-04-11: 20 mg via INTRAVENOUS

## 2023-04-11 MED ORDER — DEXAMETHASONE SODIUM PHOSPHATE 10 MG/ML IJ SOLN
INTRAMUSCULAR | Status: DC | PRN
  Administered 2023-04-11: 10 mg via INTRAVENOUS

## 2023-04-11 MED ORDER — ACETAMINOPHEN 500 MG PO TABS
1000.0000 mg | ORAL_TABLET | Freq: Once | ORAL | Status: DC
  Filled 2023-04-11: qty 2

## 2023-04-11 MED ORDER — FENTANYL CITRATE (PF) 100 MCG/2ML IJ SOLN: 25.0000 ug | INTRAMUSCULAR | Status: DC | PRN

## 2023-04-11 MED ORDER — METHYLPREDNISOLONE ACETATE 80 MG/ML IJ SUSP
INTRAMUSCULAR | Status: DC | PRN
  Administered 2023-04-11: 80 mg

## 2023-04-11 MED ORDER — MIDAZOLAM HCL 2 MG/2ML IJ SOLN
INTRAMUSCULAR | Status: DC | PRN
  Administered 2023-04-11: 2 mg via INTRAVENOUS

## 2023-04-11 MED ORDER — LIDOCAINE 2% (20 MG/ML) 5 ML SYRINGE
INTRAMUSCULAR | Status: DC | PRN
  Administered 2023-04-11: 80 mg via INTRAVENOUS

## 2023-04-11 MED ORDER — LIDOCAINE-EPINEPHRINE 1 %-1:100000 IJ SOLN
INTRAMUSCULAR | Status: AC
  Filled 2023-04-11: qty 1

## 2023-04-11 MED ORDER — FENTANYL CITRATE (PF) 250 MCG/5ML IJ SOLN
INTRAMUSCULAR | Status: DC | PRN
  Administered 2023-04-11: 50 ug via INTRAVENOUS
  Administered 2023-04-11: 100 ug via INTRAVENOUS

## 2023-04-11 MED ORDER — OXYCODONE-ACETAMINOPHEN 5-325 MG PO TABS: 1.0000 | ORAL_TABLET | Freq: Four times a day (QID) | ORAL | 0 refills | Status: AC | PRN

## 2023-04-11 MED ORDER — LIDOCAINE 2% (20 MG/ML) 5 ML SYRINGE
INTRAMUSCULAR | Status: AC
  Filled 2023-04-11: qty 5

## 2023-04-11 MED ORDER — VANCOMYCIN HCL IN DEXTROSE 1-5 GM/200ML-% IV SOLN
1000.0000 mg | INTRAVENOUS | Status: AC
  Administered 2023-04-11: 1000 mg via INTRAVENOUS
  Filled 2023-04-11: qty 200

## 2023-04-11 MED ORDER — DEXAMETHASONE SODIUM PHOSPHATE 10 MG/ML IJ SOLN
INTRAMUSCULAR | Status: AC
  Filled 2023-04-11: qty 1

## 2023-04-11 MED ORDER — 0.9 % SODIUM CHLORIDE (POUR BTL) OPTIME
TOPICAL | Status: DC | PRN
  Administered 2023-04-11: 1000 mL

## 2023-04-11 MED ORDER — CHLORHEXIDINE GLUCONATE CLOTH 2 % EX PADS: 6.0000 | MEDICATED_PAD | Freq: Once | CUTANEOUS | Status: DC

## 2023-04-11 MED ORDER — THROMBIN 5000 UNITS EX SOLR: OROMUCOSAL | Status: DC | PRN

## 2023-04-11 MED ORDER — METHYLPREDNISOLONE ACETATE 80 MG/ML IJ SUSP
INTRAMUSCULAR | Status: AC
  Filled 2023-04-11: qty 1

## 2023-04-11 MED ORDER — PROPOFOL 1000 MG/100ML IV EMUL
INTRAVENOUS | Status: AC
  Filled 2023-04-11: qty 100

## 2023-04-11 MED ORDER — ONDANSETRON HCL 4 MG/2ML IJ SOLN
INTRAMUSCULAR | Status: AC
  Filled 2023-04-11: qty 2

## 2023-04-11 MED ORDER — ONDANSETRON HCL 4 MG/2ML IJ SOLN: 4.0000 mg | Freq: Once | INTRAMUSCULAR | Status: DC | PRN

## 2023-04-11 MED ORDER — PROPOFOL 10 MG/ML IV BOLUS
INTRAVENOUS | Status: DC | PRN
  Administered 2023-04-11: 125 ug/kg/min via INTRAVENOUS
  Administered 2023-04-11: 160 mg via INTRAVENOUS
  Administered 2023-04-11: 40 mg via INTRAVENOUS

## 2023-04-11 MED ORDER — ONDANSETRON HCL 4 MG/2ML IJ SOLN
INTRAMUSCULAR | Status: DC | PRN
  Administered 2023-04-11: 4 mg via INTRAVENOUS

## 2023-04-11 MED ORDER — ROCURONIUM BROMIDE 10 MG/ML (PF) SYRINGE
PREFILLED_SYRINGE | INTRAVENOUS | Status: AC
  Filled 2023-04-11: qty 10

## 2023-04-11 MED ORDER — SUCCINYLCHOLINE CHLORIDE 200 MG/10ML IV SOSY
PREFILLED_SYRINGE | INTRAVENOUS | Status: AC
  Filled 2023-04-11: qty 10

## 2023-04-11 MED ORDER — FENTANYL CITRATE (PF) 250 MCG/5ML IJ SOLN
INTRAMUSCULAR | Status: AC
  Filled 2023-04-11: qty 5

## 2023-04-11 MED ORDER — CYCLOBENZAPRINE HCL 10 MG PO TABS: 10.0000 mg | ORAL_TABLET | Freq: Three times a day (TID) | ORAL | 0 refills | Status: AC | PRN

## 2023-04-11 MED ORDER — THROMBIN 5000 UNITS EX SOLR
CUTANEOUS | Status: AC
  Filled 2023-04-11: qty 15000

## 2023-04-11 MED ORDER — ORAL CARE MOUTH RINSE: 15.0000 mL | Freq: Once | OROMUCOSAL | Status: AC

## 2023-04-11 MED ORDER — MIDAZOLAM HCL 2 MG/2ML IJ SOLN
INTRAMUSCULAR | Status: AC
  Filled 2023-04-11: qty 2

## 2023-04-11 MED ORDER — CHLORHEXIDINE GLUCONATE 0.12 % MT SOLN
15.0000 mL | Freq: Once | OROMUCOSAL | Status: AC
  Administered 2023-04-11: 15 mL via OROMUCOSAL
  Filled 2023-04-11: qty 15

## 2023-04-11 MED ORDER — THROMBIN (RECOMBINANT) 5000 UNITS EX SOLR: CUTANEOUS | Status: DC | PRN

## 2023-04-11 MED ORDER — PROPOFOL 10 MG/ML IV BOLUS
INTRAVENOUS | Status: AC
  Filled 2023-04-11: qty 20

## 2023-04-11 MED ORDER — LACTATED RINGERS IV SOLN: INTRAVENOUS | Status: DC

## 2023-04-11 MED ORDER — BUPIVACAINE HCL (PF) 0.5 % IJ SOLN
INTRAMUSCULAR | Status: AC
  Filled 2023-04-11: qty 30

## 2023-04-11 MED ORDER — BUPIVACAINE HCL (PF) 0.5 % IJ SOLN
INTRAMUSCULAR | Status: DC | PRN
  Administered 2023-04-11: 5 mL

## 2023-04-11 MED ORDER — SUGAMMADEX SODIUM 200 MG/2ML IV SOLN
INTRAVENOUS | Status: DC | PRN
  Administered 2023-04-11: 300 mg via INTRAVENOUS

## 2023-04-11 MED ORDER — GABAPENTIN 300 MG PO CAPS
300.0000 mg | ORAL_CAPSULE | Freq: Once | ORAL | Status: AC
  Administered 2023-04-11: 300 mg via ORAL
  Filled 2023-04-11: qty 1

## 2023-04-11 SURGICAL SUPPLY — 54 items
ADH SKN CLS APL DERMABOND .7 (GAUZE/BANDAGES/DRESSINGS) ×1
APL SKNCLS STERI-STRIP NONHPOA (GAUZE/BANDAGES/DRESSINGS)
BAG COUNTER SPONGE SURGICOUNT (BAG) ×1 IMPLANT
BAG SPNG CNTER NS LX DISP (BAG) ×1
BENZOIN TINCTURE PRP APPL 2/3 (GAUZE/BANDAGES/DRESSINGS) IMPLANT
BLADE CLIPPER SURG (BLADE) IMPLANT
BLADE SURG 11 STRL SS (BLADE) ×1 IMPLANT
BUR MATCHSTICK NEURO 3.0 LAGG (BURR) ×1 IMPLANT
BUR PRECISION FLUTE 5.0 (BURR) IMPLANT
CANISTER SUCT 3000ML PPV (MISCELLANEOUS) ×1 IMPLANT
CNTNR URN SCR LID CUP LEK RST (MISCELLANEOUS) IMPLANT
CONT SPEC 4OZ STRL OR WHT (MISCELLANEOUS) ×1
DERMABOND ADVANCED .7 DNX12 (GAUZE/BANDAGES/DRESSINGS) ×1 IMPLANT
DRAPE LAPAROTOMY 100X72X124 (DRAPES) ×1 IMPLANT
DRAPE MICROSCOPE SLANT 54X150 (MISCELLANEOUS) ×1 IMPLANT
DRAPE SURG 17X23 STRL (DRAPES) ×1 IMPLANT
DRSG OPSITE POSTOP 3X4 (GAUZE/BANDAGES/DRESSINGS) ×1 IMPLANT
DURAPREP 26ML APPLICATOR (WOUND CARE) ×1 IMPLANT
ELECT REM PT RETURN 9FT ADLT (ELECTROSURGICAL) ×1
ELECTRODE REM PT RTRN 9FT ADLT (ELECTROSURGICAL) ×1 IMPLANT
GAUZE 4X4 16PLY ~~LOC~~+RFID DBL (SPONGE) IMPLANT
GAUZE SPONGE 4X4 12PLY STRL (GAUZE/BANDAGES/DRESSINGS) IMPLANT
GLOVE BIOGEL PI IND STRL 7.5 (GLOVE) ×1 IMPLANT
GLOVE ECLIPSE 7.0 STRL STRAW (GLOVE) ×1 IMPLANT
GLOVE EXAM NITRILE XL STR (GLOVE) IMPLANT
GOWN STRL REUS W/ TWL LRG LVL3 (GOWN DISPOSABLE) ×2 IMPLANT
GOWN STRL REUS W/ TWL XL LVL3 (GOWN DISPOSABLE) IMPLANT
GOWN STRL REUS W/TWL 2XL LVL3 (GOWN DISPOSABLE) IMPLANT
GOWN STRL REUS W/TWL LRG LVL3 (GOWN DISPOSABLE) ×2
GOWN STRL REUS W/TWL XL LVL3 (GOWN DISPOSABLE)
HEMOSTAT POWDER KIT SURGIFOAM (HEMOSTASIS) ×1 IMPLANT
KIT BASIN OR (CUSTOM PROCEDURE TRAY) ×1 IMPLANT
KIT TURNOVER KIT B (KITS) ×1 IMPLANT
NDL HYPO 18GX1.5 BLUNT FILL (NEEDLE) IMPLANT
NDL HYPO 22X1.5 SAFETY MO (MISCELLANEOUS) ×1 IMPLANT
NDL SPNL 18GX3.5 QUINCKE PK (NEEDLE) IMPLANT
NEEDLE HYPO 18GX1.5 BLUNT FILL (NEEDLE) ×2 IMPLANT
NEEDLE HYPO 22X1.5 SAFETY MO (MISCELLANEOUS) ×1 IMPLANT
NEEDLE SPNL 18GX3.5 QUINCKE PK (NEEDLE) IMPLANT
NS IRRIG 1000ML POUR BTL (IV SOLUTION) ×1 IMPLANT
PACK LAMINECTOMY NEURO (CUSTOM PROCEDURE TRAY) ×1 IMPLANT
PAD ARMBOARD 7.5X6 YLW CONV (MISCELLANEOUS) ×3 IMPLANT
SPIKE FLUID TRANSFER (MISCELLANEOUS) ×1 IMPLANT
SPONGE SURGIFOAM ABS GEL SZ50 (HEMOSTASIS) ×1 IMPLANT
SPONGE T-LAP 4X18 ~~LOC~~+RFID (SPONGE) IMPLANT
STRIP CLOSURE SKIN 1/2X4 (GAUZE/BANDAGES/DRESSINGS) IMPLANT
SUT VIC AB 0 CT1 18XCR BRD8 (SUTURE) ×1 IMPLANT
SUT VIC AB 0 CT1 8-18 (SUTURE) ×1
SUT VIC AB 2-0 CT1 18 (SUTURE) IMPLANT
SUT VICRYL 3-0 RB1 18 ABS (SUTURE) ×2 IMPLANT
SYR 3ML LL SCALE MARK (SYRINGE) IMPLANT
TOWEL GREEN STERILE (TOWEL DISPOSABLE) ×1 IMPLANT
TOWEL GREEN STERILE FF (TOWEL DISPOSABLE) ×1 IMPLANT
WATER STERILE IRR 1000ML POUR (IV SOLUTION) ×1 IMPLANT

## 2023-04-11 NOTE — Transfer of Care (Signed)
Immediate Anesthesia Transfer of Care Note  Patient: Laurie Reyes  Procedure(s) Performed: MICRODISCECTOMY LUMBAR FOUR-LUMBAR FIVE (Spine Lumbar)  Patient Location: PACU  Anesthesia Type:General  Level of Consciousness: awake, sedated, and drowsy  Airway & Oxygen Therapy: Patient Spontanous Breathing and Patient connected to face mask oxygen  Post-op Assessment: Report given to RN and Post -op Vital signs reviewed and stable  Post vital signs: Reviewed and stable  Last Vitals:  Vitals Value Taken Time  BP 97/61 04/11/23 0946  Temp 97.4   Pulse 66 04/11/23 0948  Resp 17 04/11/23 0948  SpO2 96 % 04/11/23 0948  Vitals shown include unfiled device data.  Last Pain:  Vitals:   04/11/23 0606  TempSrc:   PainSc: 0-No pain         Complications:  Encounter Notable Events  Notable Event Outcome Phase Comment  Difficult to intubate - unexpected  Intraprocedure Filed from anesthesia note documentation.

## 2023-04-11 NOTE — Op Note (Addendum)
NEUROSURGERY OPERATIVE NOTE   PREOP DIAGNOSIS: Lumbar disc herniation, L4-5  POSTOP DIAGNOSIS: Same  PROCEDURE: 1. Left L4-5 laminotomy and microdiscectomy for decompression of nerve root 2. Use of operating microscope  SURGEON: Dr. Lisbeth Renshaw, MD  ASSISTANT: None  ANESTHESIA: General Endotracheal  EBL: Minimal  SPECIMENS: None  DRAINS: None  COMPLICATIONS: None immediate  CONDITION: Hemodynamically stable to PACU  HISTORY: Laurie Reyes is a 62 y.o. female initially seen in the outpatient neurosurgery clinic with back and right leg pain.  Initial MRI demonstrated a right eccentric L4-5 disc herniation.  The patient attempted multiple different conservative treatments including an epidural steroid injection.  Unfortunately she continued to complain of pain, now worse on the left side.  Repeat MRI revealed significant enlargement of the L4-5 disc herniation with severe associated stenosis.  She therefore elected to proceed with surgical decompression.  The risks, benefits, and alternatives to surgery were all reviewed in detail with the patient.  After all her questions were answered informed consent was obtained and witnessed.  PROCEDURE IN DETAIL: After informed consent was obtained and witnessed, the patient was brought to the operating room. After induction of general anesthesia, the patient was positioned on the operative table in the prone position with all pressure points meticulously padded. The skin of the low back was then prepped and draped in the usual sterile fashion.  After timeout was conducted, the skin was infiltrated with local anesthetic.  Spinal needle was then introduced in order to identify the surface projection of the L4-5 interspace.  Skin incision was then made sharply and Bovie electrocautery was used to dissect the subcutaneous tissue until the lumbodorsal fascia was identified. The fashion was then incised using Bovie electrocautery and  the lamina at the L4 levels was identified and dissection was carried out in the subperiosteal plane. Self-retaining retractor was then placed, and intraoperative x-ray was taken.  It appeared that I was at the L3-4 interspace.  Dissection was therefore carried more inferiorly to identify the L4 lamina and the L4-5 interspace.  Self-retaining retractor was then placed.  At this point the microscope was draped sterilely and brought into the field and the remainder of the decompression was done under the microscope using microdissection technique.  Using a high-speed drill, left L4 laminotomy was completed with a partial medial facetectomy. The ligamentum flavum was then identified and removed and the lateral edge of the thecal sac was identified.  I then identified the traversing right L5 nerve root.  This was then traced down to identify the traversing nerve root. Dissection was then carried out superior and lateral to the nerve root to identify the disc herniation. The posterior annulus was then incised and using a combination of dissectors, curettes, and rongeurs, multiple large disc fragments were removed.  Once this was done, the L5 nerve root took up a much more normal position ventrally and laterally.  I used multiple long ball dissectors to confirm good decompression into the contralateral side.  Hemostasis was then secured using a combination of morcellized Gelfoam and thrombin and bipolar electrocautery. The wound is irrigated with copious amounts of antibiotic saline irrigation. The nerve root was then covered with a long-acting steroid solution. Self-retaining retractor was then removed, and the wound is closed in layers using a combination of interrupted 0 Vicryl and 3-0 Vicryl stitches. The skin was closed using standard skin glue.  At the end of the case all sponge, needle, and instrument counts were correct. The patient was then transferred  to the stretcher and taken to the postanesthesia care  unit in stable hemodynamic condition.   Lisbeth Renshaw, MD [facility]

## 2023-04-11 NOTE — Anesthesia Procedure Notes (Signed)
Procedure Name: Intubation Date/Time: 04/11/2023 8:09 AM  Performed by: Pincus Large, CRNAPre-anesthesia Checklist: Patient identified, Emergency Drugs available, Suction available and Patient being monitored Patient Re-evaluated:Patient Re-evaluated prior to induction Oxygen Delivery Method: Circle System Utilized Preoxygenation: Pre-oxygenation with 100% oxygen Induction Type: IV induction Ventilation: Mask ventilation without difficulty Laryngoscope Size: Glidescope, 3, Miller and 2 (1st attempt, Miller 2 blade, 2nd attempt succesful with glidescope) Grade View: Grade III Tube type: Oral Number of attempts: 1 Airway Equipment and Method: Stylet and Oral airway Placement Confirmation: ETT inserted through vocal cords under direct vision, positive ETCO2 and breath sounds checked- equal and bilateral Secured at: 22 cm Tube secured with: Tape Dental Injury: Teeth and Oropharynx as per pre-operative assessment  Difficulty Due To: Difficulty was unanticipated and Difficult Airway- due to anterior larynx

## 2023-04-11 NOTE — H&P (Signed)
Chief Complaint   No chief complaint on file.   History of Present Illness  Laurie Reyes is a 62 year old woman seen in follow-up. We have been managing back and leg pain conservatively to this point. She initially presented with right-sided back and leg pain and MRI revealing a right eccentric disc herniation at L4-5. After referral for an epidural steroid injection she noted improvement in right-sided symptoms, but onset of severe left-sided back pain. Repeat MRI scan was ordered which has been completed. She reports worsening pain since our last visit a few weeks ago, now involving radiation through both legs.   Past Medical History   Past Medical History:  Diagnosis Date   Asthma    As of 2024, no issues in many years   Dysphagia    Gastric reflux    GERD (gastroesophageal reflux disease)    Hot flashes, menopausal    Hypertension    Pre-diabetes     Past Surgical History   Past Surgical History:  Procedure Laterality Date   BACK SURGERY  10/10/2020   Lumbar   COLONOSCOPY WITH PROPOFOL N/A 08/17/2021   Procedure: COLONOSCOPY WITH PROPOFOL;  Surgeon: Regis Bill, MD;  Location: [facility] ENDOSCOPY;  Service: Endoscopy;  Laterality: N/A;   ESOPHAGOGASTRODUODENOSCOPY (EGD) WITH PROPOFOL N/A 08/17/2021   Procedure: ESOPHAGOGASTRODUODENOSCOPY (EGD) WITH PROPOFOL;  Surgeon: Regis Bill, MD;  Location: [facility] ENDOSCOPY;  Service: Endoscopy;  Laterality: N/A;   NASAL SINUS SURGERY     1990s   SHOULDER SURGERY Left    Rotator Cuff Repair in the 2010s    Social History   Social History   Tobacco Use   Smoking status: Former    Types: Cigarettes   Smokeless tobacco: Never   Tobacco comments:    Pt quit smoking cigarettes 40+ years ago (As of 03/2023)  Vaping Use   Vaping status: Never Used  Substance Use Topics   Alcohol use: No   Drug use: No    Medications   Prior to Admission medications   Medication Sig Start Date End Date Taking? Authorizing  Provider  acetaminophen (TYLENOL) 650 MG CR tablet Take 650-1,300 mg by mouth See admin instructions. Take 1300 mg in the morning and 650 mg at bedtime   Yes [provider]  amLODipine (NORVASC) 5 MG tablet Take 5 mg by mouth daily. 01/31/20  Yes [provider]  atorvastatin (LIPITOR) 10 MG tablet Take 10 mg by mouth daily.   Yes [provider]  DULoxetine (CYMBALTA) 60 MG capsule TAKE (1) CAPSULE BY MOUTH TWICE DAILY. Patient taking differently: Take 60 mg by mouth daily. 12/08/20  Yes Myrlene Broker, MD  ezetimibe (ZETIA) 10 MG tablet Take 10 mg by mouth daily.   Yes [provider]  meloxicam (MOBIC) 7.5 MG tablet Take 1 tablet (7.5 mg total) by mouth daily. Patient taking differently: Take 7.5 mg by mouth daily as needed for pain. 02/10/23  Yes Nanavati, Ankit, MD  pantoprazole (PROTONIX) 40 MG tablet Take 40 mg by mouth daily. 03/16/13  Yes [provider]  traZODone (DESYREL) 100 MG tablet Take 2 tablets (200 mg total) by mouth at bedtime. Patient taking differently: Take 100 mg by mouth at bedtime. 12/08/20  Yes Myrlene Broker, MD  EPINEPHrine (EPI-PEN) 0.3 mg/0.3 mL SOAJ injection Inject 0.3 mg into the muscle as needed for anaphylaxis. 03/06/13   [provider]  LINZESS 145 MCG CAPS capsule Take 145 mcg by mouth daily. Patient not taking: Reported on 04/04/2023  12/21/22   [provider]    Allergies   Allergies  Allergen Reactions   Iodine     Other Reaction(s): Angioedema   Penicillins Shortness Of Breath and Rash   Shellfish Allergy Anaphylaxis and Shortness Of Breath   Sulfa Antibiotics Shortness Of Breath and Rash    Review of Systems  ROS  Neurologic Exam  Awake, alert, oriented Memory and concentration grossly intact Speech fluent, appropriate CN grossly intact Motor exam: Upper Extremities Deltoid Bicep Tricep Grip  Right 5/5 5/5 5/5 5/5  Left 5/5 5/5 5/5 5/5   Lower Extremities IP Quad PF DF EHL   Right 5/5 5/5 5/5 5/5 5/5  Left 5/5 5/5 5/5 5/5 5/5   Sensation grossly intact to LT  Imaging  MRI reveals large L4-5 disc herniation with associated severe stenosis  Impression  - 62 y.o. female with back and leg pain related to large L4-5 disc herniation unresponsive to conservative treatments.  Plan  - Will proceed with L4-5 laminectomy  I have reviewed the indications for the procedure as well as the details of the procedure and the expected postoperative course and recovery at length with the patient in the office. We have also reviewed in detail the risks, benefits, and alternatives to the procedure. All questions were answered and Laurie Reyes provided informed consent to proceed.  Lisbeth Renshaw, MD [facility]

## 2023-04-11 NOTE — Discharge Summary (Signed)
Physician Discharge Summary  Patient ID: Laurie Reyes MRN: [id number] DOB/AGE: [birth year] 62 y.o.  Admit date: 04/11/2023 Discharge date: 04/11/2023  Admission Diagnoses: Lumbar disc herniation with radiculopathy, L4-5  Discharge Diagnoses: Same Active Problems:   * No active hospital problems. *   Discharged Condition: Stable  Hospital Course:  Laurie Reyes is a 62 y.o. female who underwent uncomplicated L4-5 microdiscectomy. She was at baseline postop, ambulating well, tolerating diet and was discharged in stable condition.  Treatments: Surgery - Left L4-5 laminotomy, microdiscectomy  Discharge Exam: Pulse 73, temperature 98.2 F (36.8 C), temperature source Oral, resp. rate 18, height 5\' 5"  (1.651 m), weight 99.8 kg. Awake, alert, oriented Speech fluent, appropriate CN grossly intact 5/5 BUE/BLE Wound c/d/i  Follow-up: Follow-up in my office [facility] [phone]) in 2-3 weeks  Disposition: Discharge disposition: 01-Home or Self Care       Discharge Instructions     Call MD for:  redness, tenderness, or signs of infection (pain, swelling, redness, odor or green/yellow discharge around incision site)   Complete by: As directed    Call MD for:  temperature >100.4   Complete by: As directed    Diet - low sodium heart healthy   Complete by: As directed    Discharge instructions   Complete by: As directed    Walk at home as much as possible, at least 4 times / day   Increase activity slowly   Complete by: As directed    Lifting restrictions   Complete by: As directed    No lifting > 10 lbs   May shower / Bathe   Complete by: As directed    48 hours after surgery   May walk up steps   Complete by: As directed    Other Restrictions   Complete by: As directed    No bending/twisting at waist   Remove dressing in 48 hours   Complete by: As directed       Allergies as of 04/11/2023       Reactions    Iodine    Other Reaction(s): Angioedema   Penicillins Shortness Of Breath, Rash   Shellfish Allergy Anaphylaxis, Shortness Of Breath   Sulfa Antibiotics Shortness Of Breath, Rash        Medication List     TAKE these medications    acetaminophen 650 MG CR tablet Commonly known as: TYLENOL Take 650-1,300 mg by mouth See admin instructions. Take 1300 mg in the morning and 650 mg at bedtime   amLODipine 5 MG tablet Commonly known as: NORVASC Take 5 mg by mouth daily.   atorvastatin 10 MG tablet Commonly known as: LIPITOR Take 10 mg by mouth daily.   cyclobenzaprine 10 MG tablet Commonly known as: FLEXERIL Take 1 tablet (10 mg total) by mouth 3 (three) times daily as needed for muscle spasms.   DULoxetine 60 MG capsule Commonly known as: CYMBALTA TAKE (1) CAPSULE BY MOUTH TWICE DAILY. What changed:  how much to take how to take this when to take this additional instructions   EPINEPHrine 0.3 mg/0.3 mL Soaj injection Commonly known as: EPI-PEN Inject 0.3 mg into the muscle as needed for anaphylaxis.   ezetimibe 10 MG tablet Commonly known as: ZETIA Take 10 mg by mouth daily.   Linzess 145 MCG Caps capsule Generic drug: linaclotide Take 145 mcg by mouth daily.   meloxicam 7.5 MG tablet Commonly known as: MOBIC Take 1 tablet (7.5 mg total) by mouth daily. What changed:  when to take this reasons to take this   oxyCODONE-acetaminophen 5-325 MG tablet Commonly known as: Percocet Take 1 tablet by mouth every 6 (six) hours as needed for up to 7 days for severe pain (pain score 7-10).   pantoprazole 40 MG tablet Commonly known as: PROTONIX Take 40 mg by mouth daily.   traZODone 100 MG tablet Commonly known as: DESYREL Take 2 tablets (200 mg total) by mouth at bedtime. What changed: how much to take        Follow-up Information     Lisbeth Renshaw, MD Follow up.   Specialty: Neurosurgery Contact information: [address] [place] [address] [phone]                 Signed: Jackelyn Hoehn 04/11/2023, 9:40 AM

## 2023-04-12 ENCOUNTER — Encounter (HOSPITAL_COMMUNITY): Payer: Self-pay | Admitting: Neurosurgery

## 2023-04-12 MED FILL — Thrombin For Soln 5000 Unit: CUTANEOUS | Qty: 2 | Status: AC

## 2023-04-12 NOTE — Anesthesia Postprocedure Evaluation (Signed)
Anesthesia Post Note  Patient: Laurie Reyes  Procedure(s) Performed: MICRODISCECTOMY LUMBAR FOUR-LUMBAR FIVE (Spine Lumbar)     Patient location during evaluation: PACU Anesthesia Type: General Level of consciousness: awake and alert Pain management: pain level controlled Vital Signs Assessment: post-procedure vital signs reviewed and stable Respiratory status: spontaneous breathing, nonlabored ventilation, respiratory function stable and patient connected to nasal cannula oxygen Cardiovascular status: blood pressure returned to baseline and stable Postop Assessment: no apparent nausea or vomiting Anesthetic complications: yes   Encounter Notable Events  Notable Event Outcome Phase Comment  Difficult to intubate - unexpected  Intraprocedure Filed from anesthesia note documentation.    Last Vitals:    Last Pain:                 Collene Schlichter

## 2023-05-02 ENCOUNTER — Other Ambulatory Visit (HOSPITAL_COMMUNITY): Payer: Self-pay | Admitting: Internal Medicine

## 2023-05-02 DIAGNOSIS — Z1231 Encounter for screening mammogram for malignant neoplasm of breast: Secondary | ICD-10-CM

## 2023-05-11 ENCOUNTER — Encounter (HOSPITAL_COMMUNITY): Payer: Self-pay

## 2023-05-11 ENCOUNTER — Ambulatory Visit (HOSPITAL_COMMUNITY): Payer: BC Managed Care – PPO

## 2023-05-11 ENCOUNTER — Ambulatory Visit (HOSPITAL_COMMUNITY)
Admission: RE | Admit: 2023-05-11 | Discharge: 2023-05-11 | Disposition: A | Discharge status: Home / Self Care | Payer: BC Managed Care – PPO | Source: Ambulatory Visit | Attending: Internal Medicine | Admitting: Internal Medicine

## 2023-05-11 DIAGNOSIS — Z1231 Encounter for screening mammogram for malignant neoplasm of breast: Secondary | ICD-10-CM | POA: Insufficient documentation

## 2023-06-08 DIAGNOSIS — K1321 Leukoplakia of oral mucosa, including tongue: Secondary | ICD-10-CM | POA: Diagnosis not present

## 2023-06-08 DIAGNOSIS — K136 Irritative hyperplasia of oral mucosa: Secondary | ICD-10-CM | POA: Diagnosis not present

## 2023-06-13 DIAGNOSIS — K219 Gastro-esophageal reflux disease without esophagitis: Secondary | ICD-10-CM | POA: Diagnosis not present

## 2023-06-13 DIAGNOSIS — I10 Essential (primary) hypertension: Secondary | ICD-10-CM | POA: Diagnosis not present

## 2023-06-13 DIAGNOSIS — E785 Hyperlipidemia, unspecified: Secondary | ICD-10-CM | POA: Diagnosis not present

## 2023-08-16 DIAGNOSIS — K581 Irritable bowel syndrome with constipation: Secondary | ICD-10-CM | POA: Diagnosis not present

## 2023-08-16 DIAGNOSIS — L290 Pruritus ani: Secondary | ICD-10-CM | POA: Diagnosis not present

## 2023-08-16 DIAGNOSIS — K219 Gastro-esophageal reflux disease without esophagitis: Secondary | ICD-10-CM | POA: Diagnosis not present

## 2023-08-17 DIAGNOSIS — L089 Local infection of the skin and subcutaneous tissue, unspecified: Secondary | ICD-10-CM | POA: Diagnosis not present

## 2023-08-17 DIAGNOSIS — I10 Essential (primary) hypertension: Secondary | ICD-10-CM | POA: Diagnosis not present

## 2023-08-31 DIAGNOSIS — M65311 Trigger thumb, right thumb: Secondary | ICD-10-CM | POA: Diagnosis not present

## 2023-09-08 DIAGNOSIS — I10 Essential (primary) hypertension: Secondary | ICD-10-CM | POA: Diagnosis not present

## 2023-09-08 DIAGNOSIS — E7841 Elevated Lipoprotein(a): Secondary | ICD-10-CM | POA: Diagnosis not present

## 2023-09-08 DIAGNOSIS — E669 Obesity, unspecified: Secondary | ICD-10-CM | POA: Diagnosis not present

## 2023-09-08 DIAGNOSIS — R7303 Prediabetes: Secondary | ICD-10-CM | POA: Diagnosis not present

## 2023-09-28 DIAGNOSIS — I10 Essential (primary) hypertension: Secondary | ICD-10-CM | POA: Diagnosis not present

## 2023-09-28 DIAGNOSIS — Z0001 Encounter for general adult medical examination with abnormal findings: Secondary | ICD-10-CM | POA: Diagnosis not present

## 2023-09-28 DIAGNOSIS — N390 Urinary tract infection, site not specified: Secondary | ICD-10-CM | POA: Diagnosis not present

## 2023-09-28 DIAGNOSIS — R7303 Prediabetes: Secondary | ICD-10-CM | POA: Diagnosis not present

## 2023-09-29 DIAGNOSIS — N390 Urinary tract infection, site not specified: Secondary | ICD-10-CM | POA: Diagnosis not present

## 2023-10-12 DIAGNOSIS — I10 Essential (primary) hypertension: Secondary | ICD-10-CM | POA: Diagnosis not present

## 2023-10-12 DIAGNOSIS — R7303 Prediabetes: Secondary | ICD-10-CM | POA: Diagnosis not present

## 2023-10-12 DIAGNOSIS — E669 Obesity, unspecified: Secondary | ICD-10-CM | POA: Diagnosis not present

## 2023-10-12 DIAGNOSIS — E7841 Elevated Lipoprotein(a): Secondary | ICD-10-CM | POA: Diagnosis not present

## 2023-10-17 DIAGNOSIS — M130 Polyarthritis, unspecified: Secondary | ICD-10-CM | POA: Diagnosis not present

## 2023-10-17 DIAGNOSIS — I10 Essential (primary) hypertension: Secondary | ICD-10-CM | POA: Diagnosis not present

## 2023-10-17 DIAGNOSIS — Z0001 Encounter for general adult medical examination with abnormal findings: Secondary | ICD-10-CM | POA: Diagnosis not present

## 2023-11-04 DIAGNOSIS — N9089 Other specified noninflammatory disorders of vulva and perineum: Secondary | ICD-10-CM | POA: Diagnosis not present

## 2023-11-04 DIAGNOSIS — N952 Postmenopausal atrophic vaginitis: Secondary | ICD-10-CM | POA: Diagnosis not present

## 2023-11-04 DIAGNOSIS — N898 Other specified noninflammatory disorders of vagina: Secondary | ICD-10-CM | POA: Diagnosis not present

## 2023-11-09 DIAGNOSIS — M5126 Other intervertebral disc displacement, lumbar region: Secondary | ICD-10-CM | POA: Diagnosis not present

## 2023-11-23 DIAGNOSIS — E669 Obesity, unspecified: Secondary | ICD-10-CM | POA: Diagnosis not present

## 2023-11-23 DIAGNOSIS — I10 Essential (primary) hypertension: Secondary | ICD-10-CM | POA: Diagnosis not present

## 2023-11-23 DIAGNOSIS — E7841 Elevated Lipoprotein(a): Secondary | ICD-10-CM | POA: Diagnosis not present

## 2023-11-23 DIAGNOSIS — R7303 Prediabetes: Secondary | ICD-10-CM | POA: Diagnosis not present

## 2023-11-25 DIAGNOSIS — R351 Nocturia: Secondary | ICD-10-CM | POA: Diagnosis not present

## 2023-11-25 DIAGNOSIS — N3021 Other chronic cystitis with hematuria: Secondary | ICD-10-CM | POA: Diagnosis not present

## 2023-12-02 DIAGNOSIS — I10 Essential (primary) hypertension: Secondary | ICD-10-CM | POA: Diagnosis not present

## 2023-12-02 DIAGNOSIS — N390 Urinary tract infection, site not specified: Secondary | ICD-10-CM | POA: Diagnosis not present

## 2023-12-02 DIAGNOSIS — R7303 Prediabetes: Secondary | ICD-10-CM | POA: Diagnosis not present

## 2023-12-02 DIAGNOSIS — E7841 Elevated Lipoprotein(a): Secondary | ICD-10-CM | POA: Diagnosis not present

## 2023-12-12 DIAGNOSIS — R319 Hematuria, unspecified: Secondary | ICD-10-CM | POA: Diagnosis not present

## 2023-12-12 DIAGNOSIS — K769 Liver disease, unspecified: Secondary | ICD-10-CM | POA: Diagnosis not present

## 2024-01-13 DIAGNOSIS — Z6833 Body mass index (BMI) 33.0-33.9, adult: Secondary | ICD-10-CM | POA: Diagnosis not present

## 2024-01-13 DIAGNOSIS — K219 Gastro-esophageal reflux disease without esophagitis: Secondary | ICD-10-CM | POA: Diagnosis not present

## 2024-01-13 DIAGNOSIS — E785 Hyperlipidemia, unspecified: Secondary | ICD-10-CM | POA: Diagnosis not present

## 2024-01-13 DIAGNOSIS — I10 Essential (primary) hypertension: Secondary | ICD-10-CM | POA: Diagnosis not present

## 2024-01-23 DIAGNOSIS — R3915 Urgency of urination: Secondary | ICD-10-CM | POA: Diagnosis not present

## 2024-01-23 DIAGNOSIS — R351 Nocturia: Secondary | ICD-10-CM | POA: Diagnosis not present

## 2024-01-23 DIAGNOSIS — R8289 Other abnormal findings on cytological and histological examination of urine: Secondary | ICD-10-CM | POA: Diagnosis not present

## 2024-02-16 DIAGNOSIS — Z86018 Personal history of other benign neoplasm: Secondary | ICD-10-CM | POA: Diagnosis not present

## 2024-03-19 DIAGNOSIS — N898 Other specified noninflammatory disorders of vagina: Secondary | ICD-10-CM | POA: Diagnosis not present

## 2024-03-19 DIAGNOSIS — D259 Leiomyoma of uterus, unspecified: Secondary | ICD-10-CM | POA: Diagnosis not present

## 2024-03-19 DIAGNOSIS — Z1151 Encounter for screening for human papillomavirus (HPV): Secondary | ICD-10-CM | POA: Diagnosis not present

## 2024-03-19 DIAGNOSIS — R9389 Abnormal findings on diagnostic imaging of other specified body structures: Secondary | ICD-10-CM | POA: Diagnosis not present

## 2024-03-19 DIAGNOSIS — Z01419 Encounter for gynecological examination (general) (routine) without abnormal findings: Secondary | ICD-10-CM | POA: Diagnosis not present

## 2024-03-19 DIAGNOSIS — Z124 Encounter for screening for malignant neoplasm of cervix: Secondary | ICD-10-CM | POA: Diagnosis not present

## 2024-03-19 DIAGNOSIS — Z113 Encounter for screening for infections with a predominantly sexual mode of transmission: Secondary | ICD-10-CM | POA: Diagnosis not present

## 2024-04-13 DIAGNOSIS — I10 Essential (primary) hypertension: Secondary | ICD-10-CM | POA: Diagnosis not present

## 2024-04-13 DIAGNOSIS — B977 Papillomavirus as the cause of diseases classified elsewhere: Secondary | ICD-10-CM | POA: Diagnosis not present

## 2024-04-13 DIAGNOSIS — M5126 Other intervertebral disc displacement, lumbar region: Secondary | ICD-10-CM | POA: Diagnosis not present

## 2024-04-13 DIAGNOSIS — Z23 Encounter for immunization: Secondary | ICD-10-CM | POA: Diagnosis not present

## 2024-04-13 DIAGNOSIS — R87810 Cervical high risk human papillomavirus (HPV) DNA test positive: Secondary | ICD-10-CM | POA: Diagnosis not present

## 2024-04-13 DIAGNOSIS — F330 Major depressive disorder, recurrent, mild: Secondary | ICD-10-CM | POA: Diagnosis not present

## 2024-04-19 DIAGNOSIS — B977 Papillomavirus as the cause of diseases classified elsewhere: Secondary | ICD-10-CM | POA: Diagnosis not present

## 2024-04-19 DIAGNOSIS — R9389 Abnormal findings on diagnostic imaging of other specified body structures: Secondary | ICD-10-CM | POA: Diagnosis not present

## 2024-04-19 DIAGNOSIS — R339 Retention of urine, unspecified: Secondary | ICD-10-CM | POA: Diagnosis not present

## 2024-04-19 DIAGNOSIS — M7918 Myalgia, other site: Secondary | ICD-10-CM | POA: Diagnosis not present

## 2024-05-04 ENCOUNTER — Other Ambulatory Visit (HOSPITAL_COMMUNITY): Payer: Self-pay | Admitting: Internal Medicine

## 2024-05-04 DIAGNOSIS — Z1231 Encounter for screening mammogram for malignant neoplasm of breast: Secondary | ICD-10-CM

## 2024-05-14 ENCOUNTER — Ambulatory Visit (HOSPITAL_COMMUNITY)
Admission: RE | Admit: 2024-05-14 | Discharge: 2024-05-14 | Disposition: A | Discharge status: Home / Self Care | Source: Ambulatory Visit | Attending: Internal Medicine | Admitting: Internal Medicine

## 2024-05-14 DIAGNOSIS — Z1231 Encounter for screening mammogram for malignant neoplasm of breast: Secondary | ICD-10-CM | POA: Insufficient documentation

## 2024-05-22 ENCOUNTER — Other Ambulatory Visit (HOSPITAL_COMMUNITY): Payer: Self-pay | Admitting: Internal Medicine

## 2024-05-22 DIAGNOSIS — R928 Other abnormal and inconclusive findings on diagnostic imaging of breast: Secondary | ICD-10-CM

## 2024-06-12 ENCOUNTER — Ambulatory Visit (HOSPITAL_COMMUNITY)
Admission: RE | Admit: 2024-06-12 | Discharge: 2024-06-12 | Disposition: A | Discharge status: Home / Self Care | Source: Ambulatory Visit | Attending: Internal Medicine | Admitting: Internal Medicine

## 2024-06-12 DIAGNOSIS — R928 Other abnormal and inconclusive findings on diagnostic imaging of breast: Secondary | ICD-10-CM
